# Patient Record
Sex: Female | Born: 1945
Health system: Southern US, Community
[De-identification: ages and names within clinical notes are randomized; demographics above are authoritative.]

## PROBLEM LIST (undated history)

## (undated) DIAGNOSIS — Z8719 Personal history of other diseases of the digestive system: Secondary | ICD-10-CM

## (undated) DIAGNOSIS — E785 Hyperlipidemia, unspecified: Secondary | ICD-10-CM

## (undated) DIAGNOSIS — D649 Anemia, unspecified: Secondary | ICD-10-CM

## (undated) DIAGNOSIS — H35033 Hypertensive retinopathy, bilateral: Secondary | ICD-10-CM

## (undated) DIAGNOSIS — H401130 Primary open-angle glaucoma, bilateral, stage unspecified: Secondary | ICD-10-CM

## (undated) DIAGNOSIS — Z860101 Personal history of adenomatous and serrated colon polyps: Secondary | ICD-10-CM

## (undated) DIAGNOSIS — H35359 Cystoid macular degeneration, unspecified eye: Secondary | ICD-10-CM

## (undated) DIAGNOSIS — H35059 Retinal neovascularization, unspecified, unspecified eye: Secondary | ICD-10-CM

## (undated) DIAGNOSIS — I890 Lymphedema, not elsewhere classified: Secondary | ICD-10-CM

## (undated) DIAGNOSIS — E119 Type 2 diabetes mellitus without complications: Secondary | ICD-10-CM

## (undated) DIAGNOSIS — Z8669 Personal history of other diseases of the nervous system and sense organs: Secondary | ICD-10-CM

## (undated) DIAGNOSIS — I774 Celiac artery compression syndrome: Secondary | ICD-10-CM

## (undated) DIAGNOSIS — K5730 Diverticulosis of large intestine without perforation or abscess without bleeding: Secondary | ICD-10-CM

## (undated) DIAGNOSIS — M1990 Unspecified osteoarthritis, unspecified site: Secondary | ICD-10-CM

## (undated) DIAGNOSIS — I10 Essential (primary) hypertension: Secondary | ICD-10-CM

## (undated) DIAGNOSIS — I714 Abdominal aortic aneurysm, without rupture, unspecified: Secondary | ICD-10-CM

## (undated) HISTORY — PX: JOINT REPLACEMENT: SHX530

## (undated) HISTORY — PX: COLONOSCOPY: SHX174

## (undated) HISTORY — PX: EYE SURGERY: SHX253

## (undated) HISTORY — PX: OTHER SURGICAL HISTORY: SHX169

## (undated) HISTORY — PX: REFRACTIVE SURGERY: SHX103

## (undated) HISTORY — PX: ABDOMINAL HYSTERECTOMY: SHX81

---

## 2004-10-20 ENCOUNTER — Ambulatory Visit: Payer: Self-pay | Admitting: Unknown Physician Specialty

## 2006-01-25 ENCOUNTER — Ambulatory Visit: Payer: Self-pay

## 2006-01-27 ENCOUNTER — Ambulatory Visit: Payer: Self-pay

## 2007-01-17 ENCOUNTER — Ambulatory Visit: Payer: Self-pay

## 2007-01-18 ENCOUNTER — Ambulatory Visit: Payer: Self-pay

## 2010-10-27 ENCOUNTER — Ambulatory Visit: Payer: Self-pay | Admitting: Unknown Physician Specialty

## 2010-11-17 ENCOUNTER — Ambulatory Visit: Payer: Self-pay | Admitting: Unknown Physician Specialty

## 2010-11-18 LAB — PATHOLOGY REPORT

## 2011-11-14 ENCOUNTER — Ambulatory Visit: Payer: Self-pay | Admitting: Unknown Physician Specialty

## 2012-02-23 ENCOUNTER — Emergency Department: Payer: Self-pay | Admitting: Unknown Physician Specialty

## 2012-02-28 ENCOUNTER — Ambulatory Visit: Payer: Self-pay | Admitting: Specialist

## 2012-02-28 DIAGNOSIS — I10 Essential (primary) hypertension: Secondary | ICD-10-CM

## 2012-03-01 ENCOUNTER — Ambulatory Visit: Payer: Self-pay | Admitting: Specialist

## 2012-12-13 ENCOUNTER — Ambulatory Visit: Payer: Self-pay | Admitting: Physician Assistant

## 2013-06-26 ENCOUNTER — Ambulatory Visit: Payer: Self-pay | Admitting: Orthopedic Surgery

## 2013-08-07 ENCOUNTER — Ambulatory Visit: Payer: Self-pay | Admitting: Orthopedic Surgery

## 2013-08-07 LAB — URINALYSIS, COMPLETE
Bacteria: NONE SEEN
Bilirubin,UR: NEGATIVE
Glucose,UR: NEGATIVE mg/dL (ref 0–75)
Nitrite: NEGATIVE
Ph: 8 (ref 4.5–8.0)
RBC,UR: 1 /HPF (ref 0–5)
Specific Gravity: 1.008 (ref 1.003–1.030)

## 2013-08-07 LAB — CBC
HCT: 43.8 % (ref 35.0–47.0)
MCH: 25.5 pg — ABNORMAL LOW (ref 26.0–34.0)
MCV: 78 fL — ABNORMAL LOW (ref 80–100)
Platelet: 215 10*3/uL (ref 150–440)
RBC: 5.6 10*6/uL — ABNORMAL HIGH (ref 3.80–5.20)
RDW: 14.4 % (ref 11.5–14.5)
WBC: 7.9 10*3/uL (ref 3.6–11.0)

## 2013-08-07 LAB — BASIC METABOLIC PANEL
Anion Gap: 4 — ABNORMAL LOW (ref 7–16)
BUN: 15 mg/dL (ref 7–18)
Calcium, Total: 9.1 mg/dL (ref 8.5–10.1)
Co2: 33 mmol/L — ABNORMAL HIGH (ref 21–32)
Creatinine: 0.94 mg/dL (ref 0.60–1.30)
Osmolality: 286 (ref 275–301)
Sodium: 142 mmol/L (ref 136–145)

## 2013-08-07 LAB — SEDIMENTATION RATE: Erythrocyte Sed Rate: 6 mm/hr (ref 0–30)

## 2013-08-07 LAB — APTT: Activated PTT: 28.6 secs (ref 23.6–35.9)

## 2013-08-07 LAB — PROTIME-INR: INR: 1

## 2013-08-21 ENCOUNTER — Inpatient Hospital Stay: Payer: Self-pay | Admitting: Orthopedic Surgery

## 2013-08-22 LAB — BASIC METABOLIC PANEL
Anion Gap: 7 (ref 7–16)
BUN: 13 mg/dL (ref 7–18)
Calcium, Total: 8.3 mg/dL — ABNORMAL LOW (ref 8.5–10.1)
Chloride: 104 mmol/L (ref 98–107)
Co2: 28 mmol/L (ref 21–32)
EGFR (Non-African Amer.): 46 — ABNORMAL LOW
Glucose: 136 mg/dL — ABNORMAL HIGH (ref 65–99)
Osmolality: 280 (ref 275–301)
Potassium: 3 mmol/L — ABNORMAL LOW (ref 3.5–5.1)
Sodium: 139 mmol/L (ref 136–145)

## 2013-08-23 LAB — BASIC METABOLIC PANEL
Calcium, Total: 8.2 mg/dL — ABNORMAL LOW (ref 8.5–10.1)
Chloride: 103 mmol/L (ref 98–107)
Co2: 30 mmol/L (ref 21–32)
Creatinine: 1.17 mg/dL (ref 0.60–1.30)
EGFR (African American): 55 — ABNORMAL LOW
EGFR (Non-African Amer.): 48 — ABNORMAL LOW
Glucose: 168 mg/dL — ABNORMAL HIGH (ref 65–99)
Osmolality: 277 (ref 275–301)
Potassium: 3.4 mmol/L — ABNORMAL LOW (ref 3.5–5.1)
Sodium: 137 mmol/L (ref 136–145)

## 2013-08-23 LAB — PATHOLOGY REPORT

## 2013-08-26 ENCOUNTER — Ambulatory Visit: Payer: Self-pay | Admitting: Internal Medicine

## 2013-08-27 ENCOUNTER — Encounter: Payer: Self-pay | Admitting: Internal Medicine

## 2013-09-12 ENCOUNTER — Encounter: Payer: Self-pay | Admitting: Internal Medicine

## 2014-06-27 ENCOUNTER — Ambulatory Visit: Payer: Self-pay | Admitting: Physician Assistant

## 2015-01-02 NOTE — Discharge Summary (Signed)
[name], Rose Perry MR#:  [id number] DATE OF BIRTH:  Oct 06, 1945  DATE OF ADMISSION:  08/21/2013 DATE OF DISCHARGE:  08/24/2013  ADMITTING DIAGNOSIS: Left knee osteoarthritis.   DISCHARGE DIAGNOSIS: Left knee osteoarthritis.   PROCEDURE: Left total knee replacement.   ANESTHESIA: Spinal.   SURGEON: Laurene Footman, M.D.   ASSISTANT: Rachelle Hora, PA-C.   SPECIMEN: Cut ends of bone.   ESTIMATED BLOOD LOSS: 50 mL.   COMPLICATIONS: None.   TOURNIQUET TIME: 64 minutes at 300 mmHg.  IMPLANTS: Medacta GMK primary sphere system, size 3 left, 10 mm insert, 3 left femur, 3 left tibia, with an 11 x 30 mm stem and a size 2 patella.   CONDITION: To recovery room stable.  HISTORY: The patient a 69 year old African American female who has failed conservative measures and treatment for left knee osteoarthritis including Tylenol, Aleve, using a cane, and steroid injections. She has wished to proceed with total knee replacement. Pain in the clinic was 8/10. She has had no history of problems with anesthesia, bleeding, or blood clotting issues. She also plans to be discharged to a rehab facility as her sister works at a rehab facility and strongly recommends this help with her perioperative rehab.   PHYSICAL EXAMINATION: GENERAL: Well developed, well nourished, alert and oriented x3, no acute distress.  NEUROLOGIC: Sensation intact to the left lower extremity.  HEENT: Normocephalic, atraumatic. Pupils equal, round, and reactive light and accommodation. She has full upper dentures. No lower dentures or partials.  CARDIOVASCULAR: No edema or effusion. Rate is regular. There are no murmurs.  RESPIRATORY: Lung sounds clear to auscultation. No wheezing, rales, rhonchi, or crackles. Chest expansion is symmetric bilaterally.  ABDOMEN: No hepatomegaly. No splenomegaly. Soft and nontender to palpation throughout entire abdomen. Positive bowel sounds in all 4 quadrants. EXTREMITIES: Left knee range  of motion is 3 to 95 degrees and painful with flexion. She is tender to palpation in the medial and lateral joint line. Ligamentous exam is stable. No McMurray's.  SKIN: No scars, rashes, or lesions.   HOSPITAL COURSE: The patient was admitted to the hospital on 08/21/2013. She had surgery that same day and was brought to the orthopedic floor from the PAC-U in stable condition. On postop day 1, the patient had a potassium of 3 and acute postop blood loss anemia with a hemoglobin of 11.9. Platelets were stable. She was doing well. She had good progress with physical therapy. She was given potassium 20 mEq b.i.d. On postop day 2, the patient's pain was controlled. Potassium was up to 3.4. She continued with 2 more doses of potassium 20 mEq b.i.d. The patient's vital signs were stable. She has continued to progress well with physical therapy. On postop day 3, the patient was doing well, vital signs stable, she had a bowel movement, and she was stable and ready for discharge to rehab facility with physical therapy and occupational therapy.   CONDITION AT DISCHARGE: Stable.   DISCHARGE INSTRUCTIONS: The patient can increase weight-bearing on the effected extremity. She needs to elevated the effected foot or leg on 1 or 2 pillows with the foot higher than the knee. Thigh-high TED hose on both legs and remove 1 hour per 8 hour shift. Elevate the heels off the bed. Use incentive spirometry every hour while awake and encourage cough and deep breathing. She may resume a regular diet as tolerated. No concentrated sweets or sugar. Continue using Polar Care unit maintaining a temperature between 40 and 50 degrees.  Do not get the dressing or bandage wet or dirty. Call [facility] orthopedics if the dressing gets water under it. Leave the dressing on. Call [facility] orthopedics if any of the following occur: Bright red bleeding from the incision and wound, fever above 100.5 degrees, redness or swelling or drainage  at the incision. Call [facility] orthopedics if you experience any increased leg pain, numbness, or weakness in your legs or bowel or bladder symptoms. Physical therapy has been arranged for your postop care at the rehab facility. Call [facility] orthopedics in 2 weeks for staple removal.   DISCHARGE MEDICATIONS: 1.  Atorvastatin 20 mg 1 tablet orally once a day in the morning.  2.  Amlodipine 5 mg 1 tablet orally once a day in the morning. 3.  Aspirin low-dose 81 mg oral delayed release tablet 1 tablet orally once a day. 4.  HCTZ 25 mg/37.5 mg oral tablet 1 tablet orally once a day in the morning. 5.  Tylenol 500 mg oral tablet 1 tablet orally every 4 hours as needed for pain or temp greater than 100.4.  6.  Oxycodone 5 mg oral tablet 1 tab orally every 4 hours needed for pain.  7.  Magnesium hydroxide oral suspension 30 mL orally 2 times a day as needed for constipation, 8.  Mylanta 30 mL orally every 6 hours as needed for indigestion or heartburn. 9.  Xarelto 10 mg oral tablet 1 tablet orally once a day in the morning x9 days. 10.  Metoclopramide 10 mg oral tablet 1 tablet orally. 11.  Ranitidine 150 mg oral tablet 1 tablet orally once. 12.  Bisacodyl 10 mg rectal suppository 1 suppository rectally once a day as needed for constipation.  13.  Lidocaine 0.2 mL subcutaneous once. ____________________________ T. Rachelle Hora, PA-C tcg:sb D: 08/23/2013 13:01:00 ET T: 08/23/2013 13:21:29 ET JOB#: [id number]  cc: T. Rachelle Hora, PA-C, <Dictator> Duanne Guess [place] ELECTRONICALLY SIGNED 09/03/2013 8:00

## 2015-01-02 NOTE — Op Note (Signed)
[name], Rose Perry MR#:  [id number] DATE OF BIRTH:  [birth year]  DATE OF PROCEDURE:  08/21/2013  PREOPERATIVE DIAGNOSIS: Left knee osteoarthritis.   POSTOPERATIVE DIAGNOSIS: Left knee osteoarthritis.   PROCEDURE: Left total knee replacement.   ANESTHESIA: Spinal.   SURGEON: Hessie Knows, M.D.   ASSISTANT:  Rachelle Hora, PA-C.   DESCRIPTION OF PROCEDURE: The patient was brought to the operating room and after adequate spinal anesthesia was obtained, the left leg was prepped and draped in the usual sterile fashion, after application of a tourniquet to the upper thigh and the Alvarado legholder utilized. After appropriate patient identification and timeout procedures were completed, the leg was exsanguinated with an Esmarch and the tourniquet raised to 300 mmHg. A midline skin incision was made, followed by a medial parapatellar arthrotomy. Inspection of the knee revealed extensive synovitis, tricompartmental osteoarthritis, particularly of the distal medial and femoral condyles. The ACL was excised along with the fat pad, and the proximal tibia was exposed to allow for application of the proximal tibia Medacta cutting block. The proximal tibia cut was then carried out, and the menisci were removed at this point, along with the proximal tibial bone. The distal femoral cutting block was applied with drill holes made to determine the rotation for the femoral component, as well as the distal femoral cuts of the bone matched the preop template. The 4-in-1 cutting block was applied, size 3 on the femur. Anterior, posterior and chamfer cuts made, and trial components were then placed. The size 3 tibial component appeared to give good coverage and was pinned in place; rotation based on one of the initial pins from the tibial block. The 3 femur fit well and with a 10 mm insert, full extension was obtained with excellent stability. These trials were removed with a proximal drill hole for a short stem on  the tibial component. The notch cut was made in the femoral trochlea, and the patella cut using the patellar cutting guide with drill holes. This measured a size 2. At this point, the tourniquet was let down and hemostasis checked with electrocautery. The posterior aspect of the knee was then infiltrated with a combination of Sensorcaine, morphine and Toradol, as well as periarticular tissue of Exparel diluted with saline. After raising the tourniquet and thoroughly irrigating and drying the bone surfaces, the components were cemented into place. The tibial component was cemented first, followed by the polyethylene with set screw. The femoral component cemented into place, and then the knee held in extension with the patellar button then clamped into place. All components cemented. After the cement had set, excess cement was removed, and the knee was again thoroughly irrigated and the tourniquet left down. The arthrotomy was repaired using, after slight lateral release, with a #1 Ethibond along the medial patella retinaculum and then heavy quill for the arthrotomy. 2-0 quill was used subcutaneously followed by skin staples. Xeroform, 4 x 4's, ABD, Webril, Polar Care and Ace wrap applied, along with a knee immobilizer.   SPECIMENS: Cut ends of bone.   ESTIMATED BLOOD LOSS: 50 mL.   COMPLICATIONS: None.   TOURNIQUET TIME: 64 minutes at 300 mmHg.   IMPLANTS: Medacta GMK Primary Sphere System, size 3 left, 10 mm insert, 3 left femur, 3 left tibia, with an 11 x 30 mm stem and a size 2 patella.   CONDITION: To recovery room in stable.   PATIENT NAME: Stem and a Marzella Schlein    ____________________________ Laurene Footman, MD mjm:dmm  D: 08/21/2013 12:25:30 ET T: 08/21/2013 12:51:44 ET JOB#: [id number]  cc: Laurene Footman, MD, <Dictator> Laurene Footman MD ELECTRONICALLY SIGNED 08/21/2013 17:38

## 2015-01-04 NOTE — Op Note (Signed)
[name], Rose Perry MR#:  [id number] DATE OF BIRTH:  1945-11-01  DATE OF PROCEDURE:  03/01/2012  PREOPERATIVE DIAGNOSIS: Foreign body, left foot.   POSTOPERATIVE DIAGNOSIS: Foreign body, left foot.   PROCEDURE: Removal of foreign body (sewing needle) left foot.   SURGEON: Lucas Mallow, MD   ANESTHESIA: General.   COMPLICATIONS: None.   TOURNIQUET TIME: Approximately 20 minutes.   PROCEDURE: After adequate induction of general anesthesia, the left lower extremity was thoroughly prepped with alcohol and ChloraPrep and draped in standard sterile fashion. Tourniquet was used about the calf for approximately 20 minutes. The leg was elevated and the tourniquet elevated to 300 mmHg. C-arm was used to localize the sewing needle on the plantar surface of the foot. Longitudinal one-inch incision was made and the dissection carried down through subcutaneous tissue using the loupe magnification. Approximately 1/4-inch underneath the skin the tip of the needle was seen and was removed. There was a small amount of purulent material present. This was thoroughly irrigated multiple times. Skin edges were infiltrated with 0.5% Marcaine with epinephrine. Skin is closed with 4-0 nylon. Soft bulky dressing is applied. Tourniquet is released. The patient is returned to the recovery room in satisfactory condition having tolerated the procedure quite well.   ____________________________ Lucas Mallow, MD ces:drc D: 03/01/2012 11:38:36 ET T: 03/01/2012 11:52:11 ET JOB#: [id number]  cc: Lucas Mallow, MD, <Dictator> Lucas Mallow MD ELECTRONICALLY SIGNED 03/08/2012 14:11

## 2015-02-11 DIAGNOSIS — E785 Hyperlipidemia, unspecified: Secondary | ICD-10-CM | POA: Diagnosis not present

## 2015-02-11 DIAGNOSIS — R739 Hyperglycemia, unspecified: Secondary | ICD-10-CM | POA: Diagnosis not present

## 2015-02-11 DIAGNOSIS — M1990 Unspecified osteoarthritis, unspecified site: Secondary | ICD-10-CM | POA: Diagnosis not present

## 2015-02-11 DIAGNOSIS — E876 Hypokalemia: Secondary | ICD-10-CM | POA: Diagnosis not present

## 2015-02-11 DIAGNOSIS — I10 Essential (primary) hypertension: Secondary | ICD-10-CM | POA: Diagnosis not present

## 2015-02-18 DIAGNOSIS — I10 Essential (primary) hypertension: Secondary | ICD-10-CM | POA: Diagnosis not present

## 2015-02-18 DIAGNOSIS — E119 Type 2 diabetes mellitus without complications: Secondary | ICD-10-CM | POA: Diagnosis not present

## 2015-02-18 DIAGNOSIS — M1990 Unspecified osteoarthritis, unspecified site: Secondary | ICD-10-CM | POA: Diagnosis not present

## 2015-02-18 DIAGNOSIS — E113292 Type 2 diabetes mellitus with mild nonproliferative diabetic retinopathy without macular edema, left eye: Secondary | ICD-10-CM

## 2015-02-18 DIAGNOSIS — E782 Mixed hyperlipidemia: Secondary | ICD-10-CM | POA: Diagnosis not present

## 2015-04-15 DIAGNOSIS — M3501 Sicca syndrome with keratoconjunctivitis: Secondary | ICD-10-CM | POA: Diagnosis not present

## 2015-06-30 DIAGNOSIS — L02439 Carbuncle of limb, unspecified: Secondary | ICD-10-CM | POA: Diagnosis not present

## 2016-03-24 DIAGNOSIS — M1990 Unspecified osteoarthritis, unspecified site: Secondary | ICD-10-CM | POA: Diagnosis not present

## 2016-03-24 DIAGNOSIS — Z1239 Encounter for other screening for malignant neoplasm of breast: Secondary | ICD-10-CM | POA: Diagnosis not present

## 2016-03-24 DIAGNOSIS — Z0000 Encounter for general adult medical examination without abnormal findings: Secondary | ICD-10-CM | POA: Diagnosis not present

## 2016-03-24 DIAGNOSIS — I10 Essential (primary) hypertension: Secondary | ICD-10-CM | POA: Diagnosis not present

## 2016-03-24 DIAGNOSIS — E119 Type 2 diabetes mellitus without complications: Secondary | ICD-10-CM | POA: Diagnosis not present

## 2016-03-24 DIAGNOSIS — E782 Mixed hyperlipidemia: Secondary | ICD-10-CM | POA: Diagnosis not present

## 2016-03-31 DIAGNOSIS — I10 Essential (primary) hypertension: Secondary | ICD-10-CM | POA: Diagnosis not present

## 2016-03-31 DIAGNOSIS — Z1239 Encounter for other screening for malignant neoplasm of breast: Secondary | ICD-10-CM | POA: Diagnosis not present

## 2016-03-31 DIAGNOSIS — H539 Unspecified visual disturbance: Secondary | ICD-10-CM | POA: Diagnosis not present

## 2016-03-31 DIAGNOSIS — E119 Type 2 diabetes mellitus without complications: Secondary | ICD-10-CM | POA: Diagnosis not present

## 2016-03-31 DIAGNOSIS — E782 Mixed hyperlipidemia: Secondary | ICD-10-CM | POA: Diagnosis not present

## 2016-03-31 DIAGNOSIS — Z0000 Encounter for general adult medical examination without abnormal findings: Secondary | ICD-10-CM | POA: Diagnosis not present

## 2016-03-31 DIAGNOSIS — M79671 Pain in right foot: Secondary | ICD-10-CM | POA: Diagnosis not present

## 2016-03-31 DIAGNOSIS — Z1211 Encounter for screening for malignant neoplasm of colon: Secondary | ICD-10-CM | POA: Diagnosis not present

## 2016-04-05 ENCOUNTER — Other Ambulatory Visit: Payer: Self-pay | Admitting: Physician Assistant

## 2016-04-05 DIAGNOSIS — Z1231 Encounter for screening mammogram for malignant neoplasm of breast: Secondary | ICD-10-CM

## 2016-04-19 ENCOUNTER — Other Ambulatory Visit: Payer: Self-pay | Admitting: Physician Assistant

## 2016-04-19 ENCOUNTER — Ambulatory Visit
Admission: RE | Admit: 2016-04-19 | Discharge: 2016-04-19 | Disposition: A | Discharge status: Home / Self Care | Payer: Commercial Managed Care - HMO | Source: Ambulatory Visit | Attending: Physician Assistant | Admitting: Physician Assistant

## 2016-04-19 DIAGNOSIS — Z1231 Encounter for screening mammogram for malignant neoplasm of breast: Secondary | ICD-10-CM | POA: Insufficient documentation

## 2016-04-25 DIAGNOSIS — M79671 Pain in right foot: Secondary | ICD-10-CM | POA: Diagnosis not present

## 2016-04-25 DIAGNOSIS — M7731 Calcaneal spur, right foot: Secondary | ICD-10-CM | POA: Diagnosis not present

## 2016-04-25 DIAGNOSIS — M722 Plantar fascial fibromatosis: Secondary | ICD-10-CM | POA: Diagnosis not present

## 2016-05-30 DIAGNOSIS — M722 Plantar fascial fibromatosis: Secondary | ICD-10-CM | POA: Diagnosis not present

## 2016-06-03 DIAGNOSIS — Z86010 Personal history of colonic polyps: Secondary | ICD-10-CM | POA: Diagnosis not present

## 2016-06-17 DIAGNOSIS — H04223 Epiphora due to insufficient drainage, bilateral lacrimal glands: Secondary | ICD-10-CM | POA: Diagnosis not present

## 2016-07-01 DIAGNOSIS — H04223 Epiphora due to insufficient drainage, bilateral lacrimal glands: Secondary | ICD-10-CM | POA: Diagnosis not present

## 2016-08-19 ENCOUNTER — Encounter: Payer: Self-pay | Admitting: *Deleted

## 2016-08-22 ENCOUNTER — Ambulatory Visit: Payer: Commercial Managed Care - HMO | Admitting: Anesthesiology

## 2016-08-22 ENCOUNTER — Encounter: Payer: Self-pay | Admitting: Anesthesiology

## 2016-08-22 ENCOUNTER — Ambulatory Visit
Admission: RE | Admit: 2016-08-22 | Discharge: 2016-08-22 | Disposition: A | Discharge status: Home / Self Care | Payer: Commercial Managed Care - HMO | Source: Ambulatory Visit | Attending: Unknown Physician Specialty | Admitting: Unknown Physician Specialty

## 2016-08-22 ENCOUNTER — Encounter
Admission: RE | Disposition: A | Discharge status: Home / Self Care | Payer: Self-pay | Source: Ambulatory Visit | Attending: Unknown Physician Specialty

## 2016-08-22 DIAGNOSIS — Z79899 Other long term (current) drug therapy: Secondary | ICD-10-CM | POA: Insufficient documentation

## 2016-08-22 DIAGNOSIS — Z86010 Personal history of colonic polyps: Secondary | ICD-10-CM | POA: Insufficient documentation

## 2016-08-22 DIAGNOSIS — K5790 Diverticulosis of intestine, part unspecified, without perforation or abscess without bleeding: Secondary | ICD-10-CM | POA: Diagnosis not present

## 2016-08-22 DIAGNOSIS — K5730 Diverticulosis of large intestine without perforation or abscess without bleeding: Secondary | ICD-10-CM | POA: Diagnosis not present

## 2016-08-22 DIAGNOSIS — Z1211 Encounter for screening for malignant neoplasm of colon: Secondary | ICD-10-CM | POA: Insufficient documentation

## 2016-08-22 DIAGNOSIS — K640 First degree hemorrhoids: Secondary | ICD-10-CM | POA: Insufficient documentation

## 2016-08-22 DIAGNOSIS — Z87891 Personal history of nicotine dependence: Secondary | ICD-10-CM | POA: Insufficient documentation

## 2016-08-22 DIAGNOSIS — E119 Type 2 diabetes mellitus without complications: Secondary | ICD-10-CM | POA: Diagnosis not present

## 2016-08-22 DIAGNOSIS — Z7982 Long term (current) use of aspirin: Secondary | ICD-10-CM | POA: Diagnosis not present

## 2016-08-22 DIAGNOSIS — I10 Essential (primary) hypertension: Secondary | ICD-10-CM | POA: Diagnosis not present

## 2016-08-22 DIAGNOSIS — K648 Other hemorrhoids: Secondary | ICD-10-CM | POA: Diagnosis not present

## 2016-08-22 DIAGNOSIS — E785 Hyperlipidemia, unspecified: Secondary | ICD-10-CM | POA: Insufficient documentation

## 2016-08-22 DIAGNOSIS — Z792 Long term (current) use of antibiotics: Secondary | ICD-10-CM | POA: Insufficient documentation

## 2016-08-22 HISTORY — DX: Type 2 diabetes mellitus without complications: E11.9

## 2016-08-22 HISTORY — DX: Essential (primary) hypertension: I10

## 2016-08-22 HISTORY — DX: Unspecified osteoarthritis, unspecified site: M19.90

## 2016-08-22 HISTORY — PX: COLONOSCOPY WITH PROPOFOL: SHX5780

## 2016-08-22 HISTORY — DX: Hyperlipidemia, unspecified: E78.5

## 2016-08-22 LAB — GLUCOSE, CAPILLARY: Glucose-Capillary: 96 mg/dL (ref 65–99)

## 2016-08-22 SURGERY — COLONOSCOPY WITH PROPOFOL
Anesthesia: General

## 2016-08-22 MED ORDER — PROPOFOL 10 MG/ML IV BOLUS
INTRAVENOUS | Status: DC | PRN
  Administered 2016-08-22: 50 mg via INTRAVENOUS

## 2016-08-22 MED ORDER — PIPERACILLIN-TAZOBACTAM 3.375 G IVPB 30 MIN
3.3750 g | Freq: Once | INTRAVENOUS | Status: AC
  Administered 2016-08-22: 3.375 g via INTRAVENOUS
  Filled 2016-08-22: qty 50

## 2016-08-22 MED ORDER — PROPOFOL 500 MG/50ML IV EMUL
INTRAVENOUS | Status: DC | PRN
Start: 2016-08-22 — End: 2016-08-22
  Administered 2016-08-22: 125 ug/kg/min via INTRAVENOUS

## 2016-08-22 MED ORDER — SODIUM CHLORIDE 0.9 % IV SOLN
INTRAVENOUS | Status: DC
  Administered 2016-08-22: 1000 mL via INTRAVENOUS

## 2016-08-22 MED ORDER — SODIUM CHLORIDE 0.9 % IV SOLN: INTRAVENOUS | Status: DC

## 2016-08-22 NOTE — H&P (Signed)
   Primary Care Physician:  [facility] Primary Gastroenterologist:  Dr. Vira Agar  Pre-Procedure History & Physical: HPI:  Rose Perry is a 70 y.o. female is here for an colonoscopy.   Past Medical History:  Diagnosis Date  . Arthritis   . Diabetes mellitus without complication ([place])   . Elevated lipids   . Hypertension     Past Surgical History:  Procedure Laterality Date  . ABDOMINAL HYSTERECTOMY    . COLONOSCOPY    . ltkr    . needle in foot removed    . REFRACTIVE SURGERY      Prior to Admission medications   Medication Sig Start Date End Date Taking? Authorizing Provider  amLODipine (NORVASC) 5 MG tablet Take 5 mg by mouth daily.   Yes Historical Provider, MD  amoxicillin (AMOXIL) 500 MG capsule Take 500 mg by mouth 3 (three) times daily.   Yes Historical Provider, MD  aspirin EC 81 MG tablet Take 81 mg by mouth daily.   Yes Historical Provider, MD  atorvastatin (LIPITOR) 20 MG tablet Take 20 mg by mouth daily.   Yes Historical Provider, MD  triamcinolone cream (KENALOG) 0.1 % Apply 1 application topically 2 (two) times daily.   Yes Historical Provider, MD  triamterene-hydrochlorothiazide (MAXZIDE-25) 37.5-25 MG tablet Take 1 tablet by mouth daily.   Yes Historical Provider, MD    Allergies as of 08/03/2016  . (Not on File)    History reviewed. No pertinent family history.  Social History   Social History  . Marital status: Divorced    Spouse name: N/A  . Number of children: N/A  . Years of education: N/A   Occupational History  . Not on file.   Social History Main Topics  . Smoking status: Former Research scientist (life sciences)  . Smokeless tobacco: Never Used  . Alcohol use No  . Drug use: No  . Sexual activity: Not on file   Other Topics Concern  . Not on file   Social History Narrative  . No narrative on file    Review of Systems: See HPI, otherwise negative ROS  Physical Exam: BP (!) 144/72   Pulse 67   Temp 97 F (36.1 C) (Tympanic)   Resp 16    Ht 5\' 8"  (1.727 m)   Wt 105.2 kg (232 lb)   SpO2 98%   BMI 35.28 kg/m  General:   Alert,  pleasant and cooperative in NAD Head:  Normocephalic and atraumatic. Neck:  Supple; no masses or thyromegaly. Lungs:  Clear throughout to auscultation.    Heart:  Regular rate and rhythm. Abdomen:  Soft, nontender and nondistended. Normal bowel sounds, without guarding, and without rebound.   Neurologic:  Alert and  oriented x4;  grossly normal neurologically.  Impression/Plan: Rose Perry is here for an colonoscopy to be performed for [facility] colon polyps  Risks, benefits, limitations, and alternatives regarding  colonoscopy have been reviewed with the patient.  Questions have been answered.  All parties agreeable.   Gaylyn Cheers, MD  08/22/2016, 7:47 AM

## 2016-08-22 NOTE — Anesthesia Preprocedure Evaluation (Signed)
Anesthesia Evaluation  Patient identified by MRN, date of birth, ID band Patient awake    Reviewed: Allergy & Precautions, H&P , NPO status , Patient's Chart, lab work & pertinent test results  History of Anesthesia Complications Negative for: history of anesthetic complications  Airway Mallampati: III  TM Distance: >3 FB Neck ROM: limited    Dental  (+) Poor Dentition, Chipped, Missing, Upper Dentures, Partial Lower   Pulmonary neg shortness of breath, former smoker,    Pulmonary exam normal breath sounds clear to auscultation       Cardiovascular Exercise Tolerance: Good hypertension, (-) angina(-) Past MI and (-) DOE Normal cardiovascular exam Rhythm:regular Rate:Normal     Neuro/Psych negative neurological ROS  negative psych ROS   GI/Hepatic negative GI ROS, Neg liver ROS, neg GERD  ,  Endo/Other  diabetes, Type 2  Renal/GU negative Renal ROS  negative genitourinary   Musculoskeletal  (+) Arthritis ,   Abdominal   Peds  Hematology negative hematology ROS (+)   Anesthesia Other Findings Signs and symptoms suggestive of sleep apnea   Past Medical History: No date: Arthritis No date: Diabetes mellitus without complication ([facility]) No date: Elevated lipids No date: Hypertension  Past Surgical History: No date: ABDOMINAL HYSTERECTOMY No date: COLONOSCOPY No date: ltkr No date: needle in foot removed No date: REFRACTIVE SURGERY  BMI    Body Mass Index:  35.28 kg/m      Reproductive/Obstetrics negative OB ROS                             Anesthesia Physical Anesthesia Plan  ASA: III  Anesthesia Plan: General   Post-op Pain Management:    Induction:   Airway Management Planned:   Additional Equipment:   Intra-op Plan:   Post-operative Plan:   Informed Consent: I have reviewed the patients History and Physical, chart, labs and discussed the procedure including the  risks, benefits and alternatives for the proposed anesthesia with the patient or authorized representative who has indicated his/her understanding and acceptance.   Dental Advisory Given  Plan Discussed with: Anesthesiologist, CRNA and Surgeon  Anesthesia Plan Comments:         Anesthesia Quick Evaluation

## 2016-08-22 NOTE — Op Note (Addendum)
[facility] Patient Name: Rose Perry Procedure Date: 08/22/2016 7:46 AM MRN: [id number] Account #: [id number] Date of Birth: July 22, 1946 Admit Type: Outpatient Age: 70 Room: [facility] ENDO ROOM 4 Gender: Female Note Status: Finalized Procedure:            Colonoscopy Indications:          High risk colon cancer surveillance: Personal history                        of colonic polyps Providers:            Manya Silvas, MD Referring MD:         Precious Bard, MD (Referring MD) Medicines:            Propofol per Anesthesia Complications:        No immediate complications. Procedure:            Pre-Anesthesia Assessment:                       - After reviewing the risks and benefits, the patient                        was deemed in satisfactory condition to undergo the                        procedure.                       After obtaining informed consent, the colonoscope was                        passed under direct vision. Throughout the procedure,                        the patient's blood pressure, pulse, and oxygen                        saturations were monitored continuously. The                        Colonoscope was introduced through the anus and                        advanced to the the cecum, identified by appendiceal                        orifice and ileocecal valve. The colonoscopy was                        performed without difficulty. The patient tolerated the                        procedure well. The quality of the bowel preparation                        was adequate to identify polyps. Findings:      Multiple small and large-mouthed diverticula were found in the sigmoid       colon, descending colon, transverse colon and ascending colon.      Internal hemorrhoids were found during endoscopy. The hemorrhoids were       small  and Grade I (internal hemorrhoids that do not prolapse).      The exam was otherwise without  abnormality. Impression:           - Diverticulosis in the sigmoid colon, in the                        descending colon, in the transverse colon and in the                        ascending colon.                       - Internal hemorrhoids.                       - The examination was otherwise normal.                       - No specimens collected. Recommendation:       - Repeat colonoscopy in 5 years for surveillance. Manya Silvas, MD 08/22/2016 8:13:09 AM This report has been signed electronically. Number of Addenda: 0 Note Initiated On: 08/22/2016 7:46 AM Scope Withdrawal Time: 0 hours 11 minutes 40 seconds  Total Procedure Duration: 0 hours 17 minutes 19 seconds       [facility]

## 2016-08-22 NOTE — Anesthesia Postprocedure Evaluation (Signed)
Anesthesia Post Note  Patient: Rose Perry  Procedure(s) Performed: Procedure(s) (LRB): COLONOSCOPY WITH PROPOFOL (N/A)  Patient location during evaluation: Endoscopy Anesthesia Type: General Level of consciousness: awake and alert Pain management: pain level controlled Vital Signs Assessment: post-procedure vital signs reviewed and stable Respiratory status: spontaneous breathing, nonlabored ventilation, respiratory function stable and patient connected to nasal cannula oxygen Cardiovascular status: blood pressure returned to baseline and stable Postop Assessment: no signs of nausea or vomiting Anesthetic complications: no    Last Vitals:  Vitals:   08/22/16 0834 08/22/16 0844  BP: (!) 122/58 138/66  Pulse: (!) 58 (!) 56  Resp: 19 15  Temp:      Last Pain:  Vitals:   08/22/16 0815  TempSrc: Temporal                 Precious Haws Axel Frisk

## 2016-08-22 NOTE — Transfer of Care (Signed)
Immediate Anesthesia Transfer of Care Note  Patient: Rose Perry  Procedure(s) Performed: Procedure(s): COLONOSCOPY WITH PROPOFOL (N/A)  Patient Location: PACU  Anesthesia Type:General  Level of Consciousness: awake, alert , oriented and patient cooperative  Airway & Oxygen Therapy: Patient Spontanous Breathing and Patient connected to nasal cannula oxygen  Post-op Assessment: Report given to RN and Post -op Vital signs reviewed and stable  Post vital signs: Reviewed and stable  Last Vitals:  Vitals:   08/22/16 0814 08/22/16 0815  BP: 97/70 97/70  Pulse: 74 78  Resp: (!) 22   Temp: 36.3 C     Last Pain:  Vitals:   08/22/16 0815  TempSrc: Temporal         Complications: No apparent anesthesia complications

## 2016-08-23 ENCOUNTER — Encounter: Payer: Self-pay | Admitting: Unknown Physician Specialty

## 2017-03-17 ENCOUNTER — Other Ambulatory Visit: Payer: Self-pay | Admitting: Physician Assistant

## 2017-03-17 DIAGNOSIS — Z1231 Encounter for screening mammogram for malignant neoplasm of breast: Secondary | ICD-10-CM

## 2017-04-05 DIAGNOSIS — E782 Mixed hyperlipidemia: Secondary | ICD-10-CM | POA: Diagnosis not present

## 2017-04-05 DIAGNOSIS — Z0000 Encounter for general adult medical examination without abnormal findings: Secondary | ICD-10-CM | POA: Diagnosis not present

## 2017-04-05 DIAGNOSIS — I10 Essential (primary) hypertension: Secondary | ICD-10-CM | POA: Diagnosis not present

## 2017-04-05 DIAGNOSIS — E119 Type 2 diabetes mellitus without complications: Secondary | ICD-10-CM | POA: Diagnosis not present

## 2017-04-17 DIAGNOSIS — E876 Hypokalemia: Secondary | ICD-10-CM | POA: Diagnosis not present

## 2017-04-20 ENCOUNTER — Ambulatory Visit
Admission: RE | Admit: 2017-04-20 | Discharge: 2017-04-20 | Disposition: A | Discharge status: Home / Self Care | Payer: Commercial Managed Care - HMO | Source: Ambulatory Visit | Attending: Physician Assistant | Admitting: Physician Assistant

## 2017-04-20 DIAGNOSIS — Z1231 Encounter for screening mammogram for malignant neoplasm of breast: Secondary | ICD-10-CM | POA: Insufficient documentation

## 2017-07-26 DIAGNOSIS — E782 Mixed hyperlipidemia: Secondary | ICD-10-CM | POA: Diagnosis not present

## 2017-07-26 DIAGNOSIS — Z0000 Encounter for general adult medical examination without abnormal findings: Secondary | ICD-10-CM | POA: Diagnosis not present

## 2017-07-26 DIAGNOSIS — E119 Type 2 diabetes mellitus without complications: Secondary | ICD-10-CM | POA: Diagnosis not present

## 2017-07-26 DIAGNOSIS — I10 Essential (primary) hypertension: Secondary | ICD-10-CM | POA: Diagnosis not present

## 2018-04-18 DIAGNOSIS — E782 Mixed hyperlipidemia: Secondary | ICD-10-CM | POA: Diagnosis not present

## 2018-04-18 DIAGNOSIS — Z0000 Encounter for general adult medical examination without abnormal findings: Secondary | ICD-10-CM | POA: Diagnosis not present

## 2018-04-18 DIAGNOSIS — E119 Type 2 diabetes mellitus without complications: Secondary | ICD-10-CM | POA: Diagnosis not present

## 2018-04-18 DIAGNOSIS — I10 Essential (primary) hypertension: Secondary | ICD-10-CM | POA: Diagnosis not present

## 2018-04-25 DIAGNOSIS — Z6841 Body Mass Index (BMI) 40.0 and over, adult: Secondary | ICD-10-CM | POA: Diagnosis not present

## 2018-04-25 DIAGNOSIS — E782 Mixed hyperlipidemia: Secondary | ICD-10-CM | POA: Diagnosis not present

## 2018-04-25 DIAGNOSIS — E119 Type 2 diabetes mellitus without complications: Secondary | ICD-10-CM | POA: Diagnosis not present

## 2018-04-25 DIAGNOSIS — I10 Essential (primary) hypertension: Secondary | ICD-10-CM | POA: Diagnosis not present

## 2018-04-25 DIAGNOSIS — Z780 Asymptomatic menopausal state: Secondary | ICD-10-CM | POA: Diagnosis not present

## 2018-04-25 DIAGNOSIS — Z0000 Encounter for general adult medical examination without abnormal findings: Secondary | ICD-10-CM | POA: Diagnosis not present

## 2018-04-25 DIAGNOSIS — Z1239 Encounter for other screening for malignant neoplasm of breast: Secondary | ICD-10-CM | POA: Diagnosis not present

## 2018-05-03 DIAGNOSIS — Z780 Asymptomatic menopausal state: Secondary | ICD-10-CM | POA: Diagnosis not present

## 2018-05-04 ENCOUNTER — Other Ambulatory Visit: Payer: Self-pay | Admitting: Physician Assistant

## 2018-05-04 DIAGNOSIS — Z1231 Encounter for screening mammogram for malignant neoplasm of breast: Secondary | ICD-10-CM

## 2018-05-21 ENCOUNTER — Ambulatory Visit
Admission: RE | Admit: 2018-05-21 | Discharge: 2018-05-21 | Disposition: A | Discharge status: Home / Self Care | Payer: Medicare HMO | Source: Ambulatory Visit | Attending: Physician Assistant | Admitting: Physician Assistant

## 2018-05-21 DIAGNOSIS — Z1231 Encounter for screening mammogram for malignant neoplasm of breast: Secondary | ICD-10-CM | POA: Diagnosis not present

## 2018-10-26 DIAGNOSIS — Z0000 Encounter for general adult medical examination without abnormal findings: Secondary | ICD-10-CM | POA: Diagnosis not present

## 2018-10-26 DIAGNOSIS — Z6841 Body Mass Index (BMI) 40.0 and over, adult: Secondary | ICD-10-CM | POA: Diagnosis not present

## 2018-10-26 DIAGNOSIS — E119 Type 2 diabetes mellitus without complications: Secondary | ICD-10-CM | POA: Diagnosis not present

## 2018-10-26 DIAGNOSIS — I10 Essential (primary) hypertension: Secondary | ICD-10-CM | POA: Diagnosis not present

## 2018-10-26 DIAGNOSIS — E782 Mixed hyperlipidemia: Secondary | ICD-10-CM | POA: Diagnosis not present

## 2018-10-26 DIAGNOSIS — Z780 Asymptomatic menopausal state: Secondary | ICD-10-CM | POA: Diagnosis not present

## 2018-11-01 DIAGNOSIS — H35051 Retinal neovascularization, unspecified, right eye: Secondary | ICD-10-CM | POA: Diagnosis not present

## 2018-11-01 DIAGNOSIS — H35033 Hypertensive retinopathy, bilateral: Secondary | ICD-10-CM | POA: Diagnosis not present

## 2018-11-01 DIAGNOSIS — H35351 Cystoid macular degeneration, right eye: Secondary | ICD-10-CM | POA: Diagnosis not present

## 2018-11-01 DIAGNOSIS — E113292 Type 2 diabetes mellitus with mild nonproliferative diabetic retinopathy without macular edema, left eye: Secondary | ICD-10-CM | POA: Diagnosis not present

## 2018-11-02 DIAGNOSIS — H35033 Hypertensive retinopathy, bilateral: Secondary | ICD-10-CM | POA: Insufficient documentation

## 2018-11-02 DIAGNOSIS — H35051 Retinal neovascularization, unspecified, right eye: Secondary | ICD-10-CM | POA: Insufficient documentation

## 2018-11-02 DIAGNOSIS — H35351 Cystoid macular degeneration, right eye: Secondary | ICD-10-CM | POA: Insufficient documentation

## 2018-12-06 DIAGNOSIS — H35351 Cystoid macular degeneration, right eye: Secondary | ICD-10-CM | POA: Diagnosis not present

## 2019-02-25 DIAGNOSIS — L02619 Cutaneous abscess of unspecified foot: Secondary | ICD-10-CM | POA: Diagnosis not present

## 2019-02-25 DIAGNOSIS — W57XXXA Bitten or stung by nonvenomous insect and other nonvenomous arthropods, initial encounter: Secondary | ICD-10-CM | POA: Diagnosis not present

## 2019-02-25 DIAGNOSIS — L03115 Cellulitis of right lower limb: Secondary | ICD-10-CM | POA: Diagnosis not present

## 2019-02-25 DIAGNOSIS — S90861A Insect bite (nonvenomous), right foot, initial encounter: Secondary | ICD-10-CM | POA: Diagnosis not present

## 2019-03-01 DIAGNOSIS — H35351 Cystoid macular degeneration, right eye: Secondary | ICD-10-CM | POA: Diagnosis not present

## 2019-03-19 DIAGNOSIS — I10 Essential (primary) hypertension: Secondary | ICD-10-CM | POA: Diagnosis not present

## 2019-03-19 DIAGNOSIS — E782 Mixed hyperlipidemia: Secondary | ICD-10-CM | POA: Diagnosis not present

## 2019-03-19 DIAGNOSIS — S90561D Insect bite (nonvenomous), right ankle, subsequent encounter: Secondary | ICD-10-CM | POA: Diagnosis not present

## 2019-03-19 DIAGNOSIS — W57XXXD Bitten or stung by nonvenomous insect and other nonvenomous arthropods, subsequent encounter: Secondary | ICD-10-CM | POA: Diagnosis not present

## 2019-03-19 DIAGNOSIS — E119 Type 2 diabetes mellitus without complications: Secondary | ICD-10-CM | POA: Diagnosis not present

## 2019-04-04 DIAGNOSIS — H35351 Cystoid macular degeneration, right eye: Secondary | ICD-10-CM | POA: Diagnosis not present

## 2019-04-15 ENCOUNTER — Other Ambulatory Visit: Payer: Self-pay | Admitting: Physician Assistant

## 2019-04-15 DIAGNOSIS — Z1231 Encounter for screening mammogram for malignant neoplasm of breast: Secondary | ICD-10-CM

## 2019-04-23 DIAGNOSIS — E119 Type 2 diabetes mellitus without complications: Secondary | ICD-10-CM | POA: Diagnosis not present

## 2019-04-23 DIAGNOSIS — E782 Mixed hyperlipidemia: Secondary | ICD-10-CM | POA: Diagnosis not present

## 2019-04-23 DIAGNOSIS — I10 Essential (primary) hypertension: Secondary | ICD-10-CM | POA: Diagnosis not present

## 2019-04-30 DIAGNOSIS — E119 Type 2 diabetes mellitus without complications: Secondary | ICD-10-CM | POA: Diagnosis not present

## 2019-04-30 DIAGNOSIS — I10 Essential (primary) hypertension: Secondary | ICD-10-CM | POA: Diagnosis not present

## 2019-04-30 DIAGNOSIS — Z0000 Encounter for general adult medical examination without abnormal findings: Secondary | ICD-10-CM | POA: Diagnosis not present

## 2019-04-30 DIAGNOSIS — E782 Mixed hyperlipidemia: Secondary | ICD-10-CM | POA: Diagnosis not present

## 2019-04-30 DIAGNOSIS — Z1239 Encounter for other screening for malignant neoplasm of breast: Secondary | ICD-10-CM | POA: Diagnosis not present

## 2019-05-06 DIAGNOSIS — H35351 Cystoid macular degeneration, right eye: Secondary | ICD-10-CM | POA: Diagnosis not present

## 2019-05-27 ENCOUNTER — Ambulatory Visit
Admission: RE | Admit: 2019-05-27 | Discharge: 2019-05-27 | Disposition: A | Discharge status: Home / Self Care | Payer: Medicare HMO | Source: Ambulatory Visit | Attending: Physician Assistant | Admitting: Physician Assistant

## 2019-05-27 DIAGNOSIS — Z1231 Encounter for screening mammogram for malignant neoplasm of breast: Secondary | ICD-10-CM

## 2019-06-07 DIAGNOSIS — E113292 Type 2 diabetes mellitus with mild nonproliferative diabetic retinopathy without macular edema, left eye: Secondary | ICD-10-CM | POA: Diagnosis not present

## 2019-06-07 DIAGNOSIS — H35351 Cystoid macular degeneration, right eye: Secondary | ICD-10-CM | POA: Diagnosis not present

## 2019-06-07 DIAGNOSIS — H35033 Hypertensive retinopathy, bilateral: Secondary | ICD-10-CM | POA: Diagnosis not present

## 2019-06-07 DIAGNOSIS — H35051 Retinal neovascularization, unspecified, right eye: Secondary | ICD-10-CM | POA: Diagnosis not present

## 2019-07-05 DIAGNOSIS — H35351 Cystoid macular degeneration, right eye: Secondary | ICD-10-CM | POA: Diagnosis not present

## 2019-07-12 DIAGNOSIS — H401132 Primary open-angle glaucoma, bilateral, moderate stage: Secondary | ICD-10-CM | POA: Diagnosis not present

## 2019-07-12 DIAGNOSIS — H2513 Age-related nuclear cataract, bilateral: Secondary | ICD-10-CM | POA: Diagnosis not present

## 2019-07-12 DIAGNOSIS — H35051 Retinal neovascularization, unspecified, right eye: Secondary | ICD-10-CM | POA: Diagnosis not present

## 2019-08-02 DIAGNOSIS — H35351 Cystoid macular degeneration, right eye: Secondary | ICD-10-CM | POA: Diagnosis not present

## 2019-08-02 DIAGNOSIS — H401132 Primary open-angle glaucoma, bilateral, moderate stage: Secondary | ICD-10-CM | POA: Insufficient documentation

## 2019-08-30 DIAGNOSIS — H35033 Hypertensive retinopathy, bilateral: Secondary | ICD-10-CM | POA: Diagnosis not present

## 2019-08-30 DIAGNOSIS — E113292 Type 2 diabetes mellitus with mild nonproliferative diabetic retinopathy without macular edema, left eye: Secondary | ICD-10-CM | POA: Diagnosis not present

## 2019-08-30 DIAGNOSIS — H35351 Cystoid macular degeneration, right eye: Secondary | ICD-10-CM | POA: Diagnosis not present

## 2019-08-30 DIAGNOSIS — H35051 Retinal neovascularization, unspecified, right eye: Secondary | ICD-10-CM | POA: Diagnosis not present

## 2019-08-30 DIAGNOSIS — E119 Type 2 diabetes mellitus without complications: Secondary | ICD-10-CM | POA: Diagnosis not present

## 2019-10-24 DIAGNOSIS — I10 Essential (primary) hypertension: Secondary | ICD-10-CM | POA: Diagnosis not present

## 2019-10-24 DIAGNOSIS — E782 Mixed hyperlipidemia: Secondary | ICD-10-CM | POA: Diagnosis not present

## 2019-10-24 DIAGNOSIS — E119 Type 2 diabetes mellitus without complications: Secondary | ICD-10-CM | POA: Diagnosis not present

## 2019-11-04 DIAGNOSIS — E782 Mixed hyperlipidemia: Secondary | ICD-10-CM | POA: Diagnosis not present

## 2019-11-04 DIAGNOSIS — I10 Essential (primary) hypertension: Secondary | ICD-10-CM | POA: Diagnosis not present

## 2019-11-04 DIAGNOSIS — M1990 Unspecified osteoarthritis, unspecified site: Secondary | ICD-10-CM | POA: Diagnosis not present

## 2019-11-04 DIAGNOSIS — Z0000 Encounter for general adult medical examination without abnormal findings: Secondary | ICD-10-CM | POA: Diagnosis not present

## 2019-11-04 DIAGNOSIS — Z87891 Personal history of nicotine dependence: Secondary | ICD-10-CM | POA: Diagnosis not present

## 2019-11-04 DIAGNOSIS — E876 Hypokalemia: Secondary | ICD-10-CM | POA: Diagnosis not present

## 2019-11-04 DIAGNOSIS — E11311 Type 2 diabetes mellitus with unspecified diabetic retinopathy with macular edema: Secondary | ICD-10-CM | POA: Diagnosis not present

## 2019-11-04 IMAGING — MG MM DIGITAL SCREENING BILAT W/ TOMO W/ CAD
8 series · 8 of 24 positions shown · non-contrast
Comparison: Previous exam(s).

CLINICAL DATA: Screening.

EXAM:
DIGITAL SCREENING BILATERAL MAMMOGRAM WITH TOMO AND CAD

[L CC synth-2D]
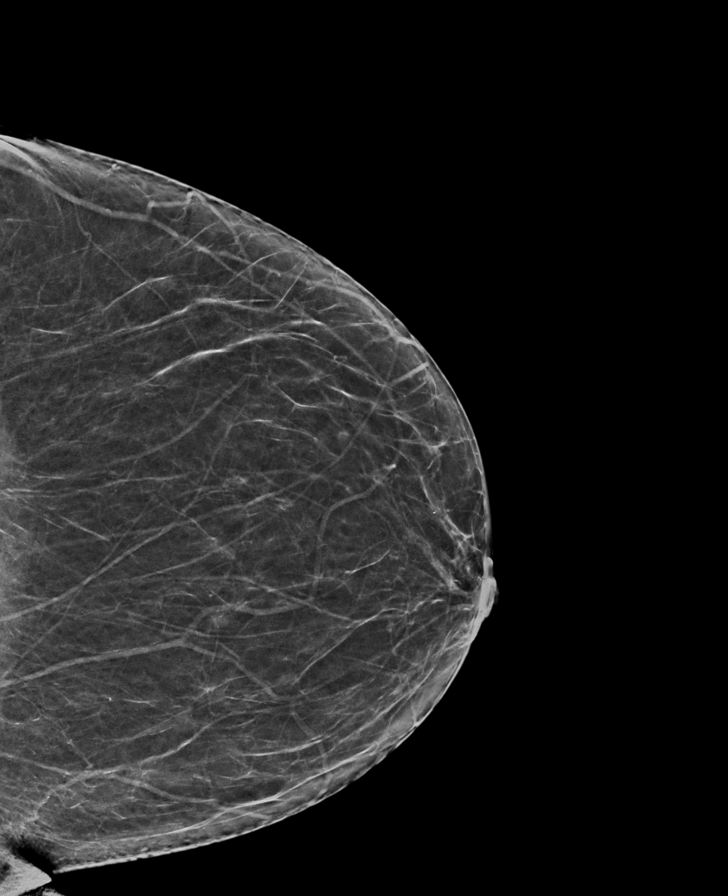

[L MLO synth-2D]
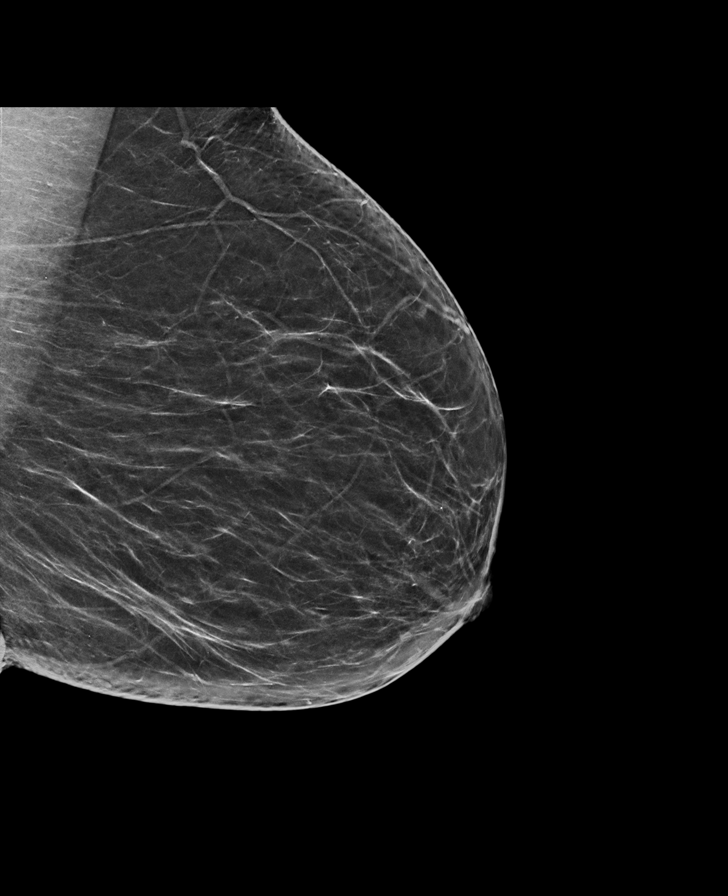

[R MLO synth-2D]
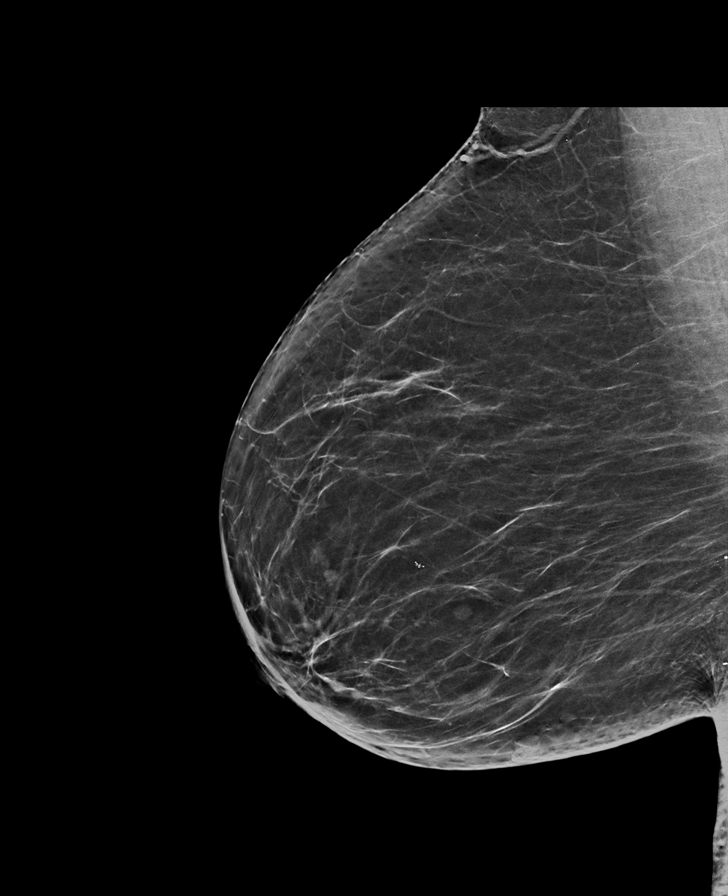

[R CC synth-2D]
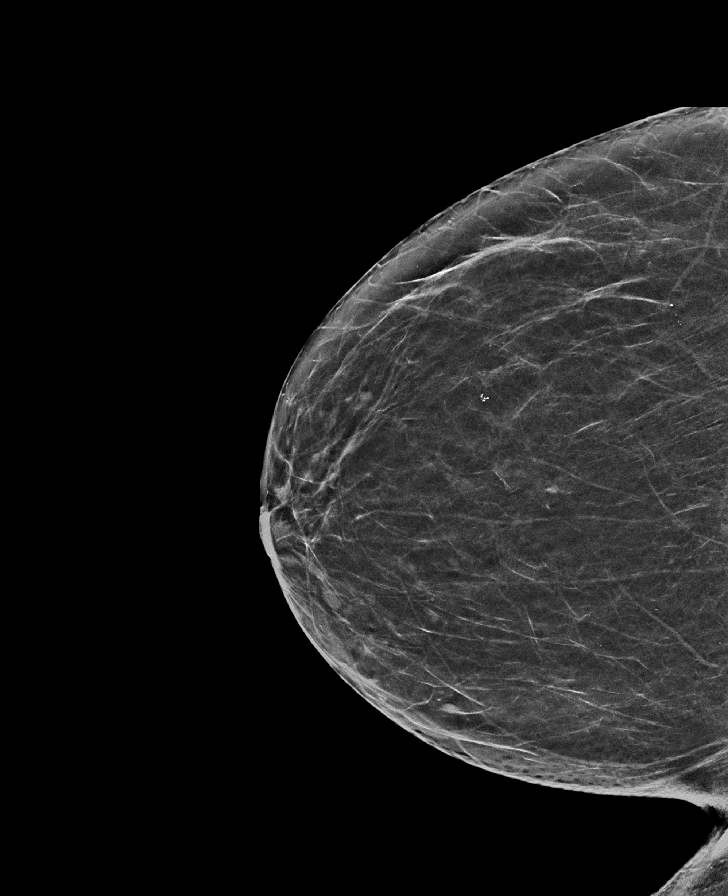

[L CC tomo · tomo slice 31/62.0]
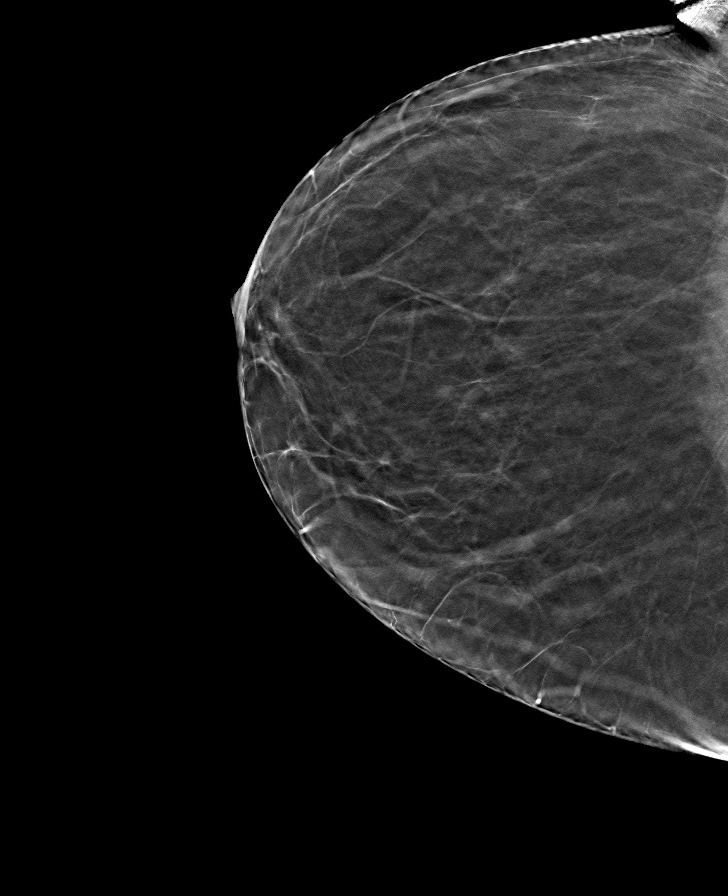

[R CC tomo · tomo slice 31/60.0]
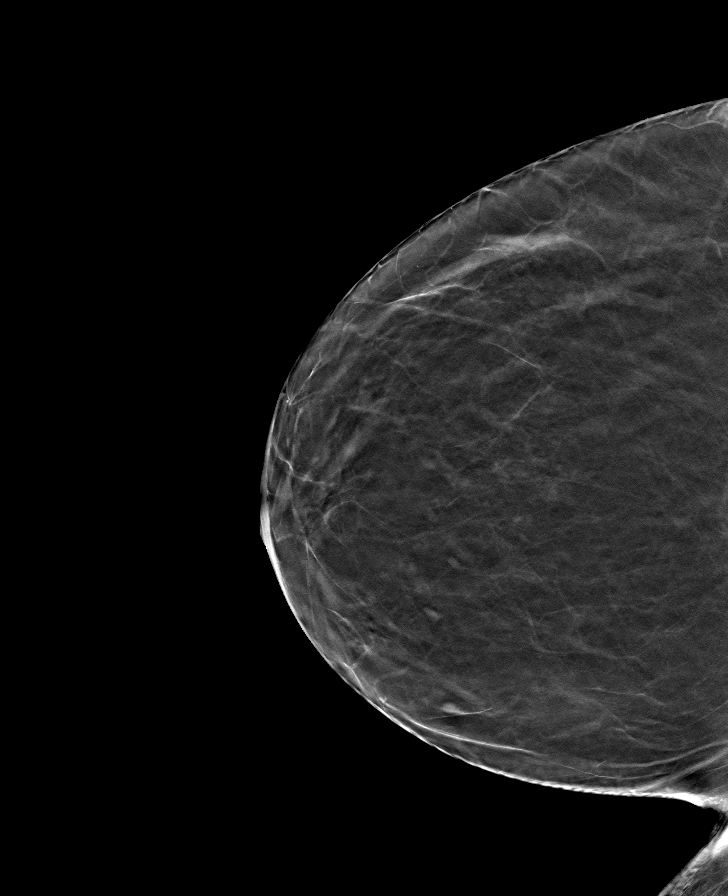

[R MLO tomo · tomo slice 35/70.0]
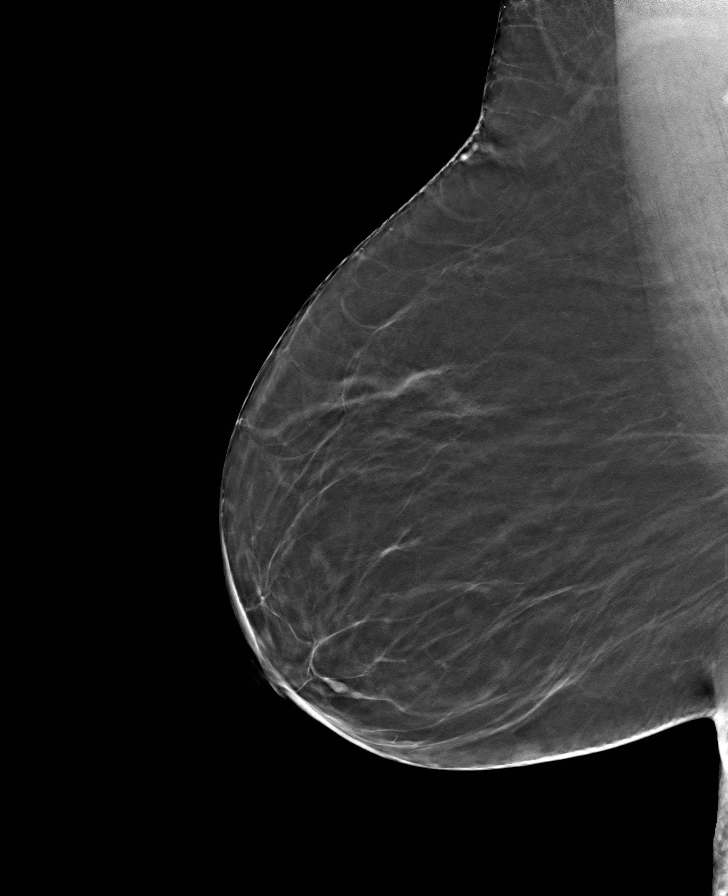

[L MLO tomo · tomo slice 35/69.0]
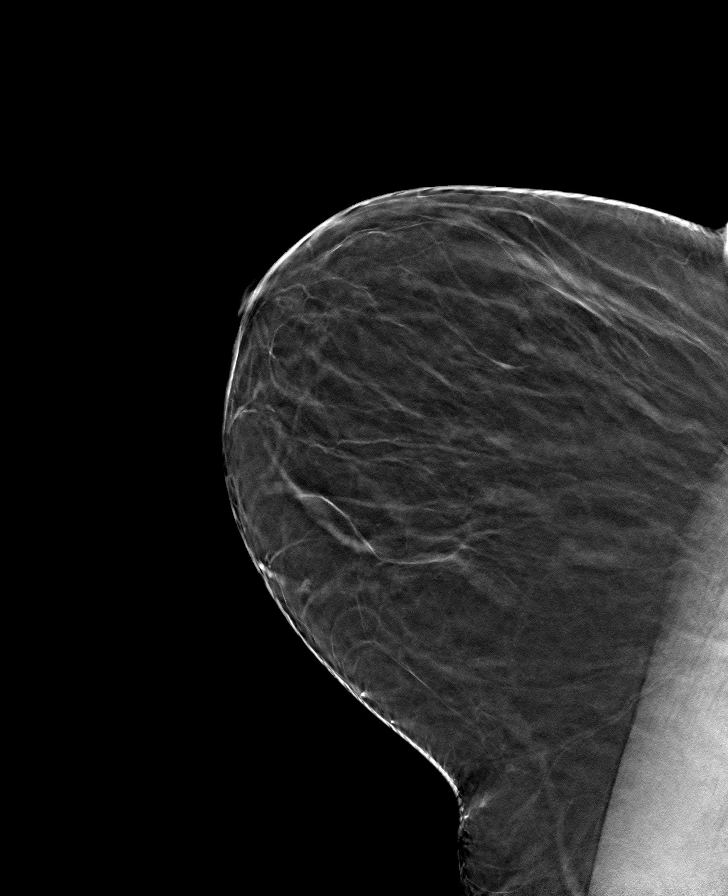

[8 of 24 positions shown; findings below may reference images not displayed]

ACR Breast Density Category b: There are scattered areas of
fibroglandular density.
FINDINGS: There are no findings suspicious for malignancy. Images were
processed with CAD.
IMPRESSION: No mammographic evidence of malignancy. A result letter of this
screening mammogram will be mailed directly to the patient.

RECOMMENDATION:
Screening mammogram in one year. (Code:CN-U-775)

BI-RADS CATEGORY  1: Negative.

## 2019-11-18 DIAGNOSIS — H35351 Cystoid macular degeneration, right eye: Secondary | ICD-10-CM | POA: Diagnosis not present

## 2020-01-09 DIAGNOSIS — H35351 Cystoid macular degeneration, right eye: Secondary | ICD-10-CM | POA: Diagnosis not present

## 2020-01-09 DIAGNOSIS — H35051 Retinal neovascularization, unspecified, right eye: Secondary | ICD-10-CM | POA: Diagnosis not present

## 2020-03-09 DIAGNOSIS — E113292 Type 2 diabetes mellitus with mild nonproliferative diabetic retinopathy without macular edema, left eye: Secondary | ICD-10-CM | POA: Diagnosis not present

## 2020-03-09 DIAGNOSIS — H35033 Hypertensive retinopathy, bilateral: Secondary | ICD-10-CM | POA: Diagnosis not present

## 2020-03-09 DIAGNOSIS — H35051 Retinal neovascularization, unspecified, right eye: Secondary | ICD-10-CM | POA: Diagnosis not present

## 2020-03-09 DIAGNOSIS — H35351 Cystoid macular degeneration, right eye: Secondary | ICD-10-CM | POA: Diagnosis not present

## 2020-04-22 ENCOUNTER — Other Ambulatory Visit: Payer: Self-pay | Admitting: Physician Assistant

## 2020-04-22 DIAGNOSIS — Z1231 Encounter for screening mammogram for malignant neoplasm of breast: Secondary | ICD-10-CM

## 2020-05-04 DIAGNOSIS — E782 Mixed hyperlipidemia: Secondary | ICD-10-CM | POA: Diagnosis not present

## 2020-05-04 DIAGNOSIS — E119 Type 2 diabetes mellitus without complications: Secondary | ICD-10-CM | POA: Diagnosis not present

## 2020-05-04 DIAGNOSIS — I10 Essential (primary) hypertension: Secondary | ICD-10-CM | POA: Diagnosis not present

## 2020-05-11 DIAGNOSIS — Z0000 Encounter for general adult medical examination without abnormal findings: Secondary | ICD-10-CM | POA: Diagnosis not present

## 2020-05-11 DIAGNOSIS — E1142 Type 2 diabetes mellitus with diabetic polyneuropathy: Secondary | ICD-10-CM | POA: Diagnosis not present

## 2020-05-11 DIAGNOSIS — I10 Essential (primary) hypertension: Secondary | ICD-10-CM | POA: Diagnosis not present

## 2020-05-11 DIAGNOSIS — E782 Mixed hyperlipidemia: Secondary | ICD-10-CM | POA: Diagnosis not present

## 2020-05-11 DIAGNOSIS — Z1231 Encounter for screening mammogram for malignant neoplasm of breast: Secondary | ICD-10-CM | POA: Diagnosis not present

## 2020-05-27 ENCOUNTER — Other Ambulatory Visit: Payer: Self-pay

## 2020-05-27 ENCOUNTER — Ambulatory Visit
Admission: RE | Admit: 2020-05-27 | Discharge: 2020-05-27 | Disposition: A | Discharge status: Home / Self Care | Payer: Medicare HMO | Source: Ambulatory Visit | Attending: Physician Assistant | Admitting: Physician Assistant

## 2020-05-27 DIAGNOSIS — Z1231 Encounter for screening mammogram for malignant neoplasm of breast: Secondary | ICD-10-CM | POA: Diagnosis not present

## 2020-07-23 DIAGNOSIS — H35351 Cystoid macular degeneration, right eye: Secondary | ICD-10-CM | POA: Diagnosis not present

## 2020-08-09 ENCOUNTER — Encounter: Payer: Self-pay | Admitting: Emergency Medicine

## 2020-08-09 ENCOUNTER — Inpatient Hospital Stay
Admission: EM | Admit: 2020-08-09 | Discharge: 2020-08-14 | DRG: 378 | Disposition: A | Discharge status: Home / Self Care | Payer: Medicare HMO | Attending: Internal Medicine | Admitting: Internal Medicine

## 2020-08-09 ENCOUNTER — Other Ambulatory Visit: Payer: Self-pay

## 2020-08-09 DIAGNOSIS — Z90710 Acquired absence of both cervix and uterus: Secondary | ICD-10-CM

## 2020-08-09 DIAGNOSIS — K5731 Diverticulosis of large intestine without perforation or abscess with bleeding: Principal | ICD-10-CM | POA: Diagnosis present

## 2020-08-09 DIAGNOSIS — I10 Essential (primary) hypertension: Secondary | ICD-10-CM

## 2020-08-09 DIAGNOSIS — E876 Hypokalemia: Secondary | ICD-10-CM | POA: Diagnosis not present

## 2020-08-09 DIAGNOSIS — K922 Gastrointestinal hemorrhage, unspecified: Secondary | ICD-10-CM

## 2020-08-09 DIAGNOSIS — I959 Hypotension, unspecified: Secondary | ICD-10-CM | POA: Diagnosis present

## 2020-08-09 DIAGNOSIS — D62 Acute posthemorrhagic anemia: Secondary | ICD-10-CM | POA: Diagnosis not present

## 2020-08-09 DIAGNOSIS — I771 Stricture of artery: Secondary | ICD-10-CM | POA: Diagnosis present

## 2020-08-09 DIAGNOSIS — E113292 Type 2 diabetes mellitus with mild nonproliferative diabetic retinopathy without macular edema, left eye: Secondary | ICD-10-CM

## 2020-08-09 DIAGNOSIS — I723 Aneurysm of iliac artery: Secondary | ICD-10-CM | POA: Diagnosis present

## 2020-08-09 DIAGNOSIS — Z66 Do not resuscitate: Secondary | ICD-10-CM | POA: Diagnosis not present

## 2020-08-09 DIAGNOSIS — Z7982 Long term (current) use of aspirin: Secondary | ICD-10-CM

## 2020-08-09 DIAGNOSIS — K449 Diaphragmatic hernia without obstruction or gangrene: Secondary | ICD-10-CM | POA: Diagnosis not present

## 2020-08-09 DIAGNOSIS — I472 Ventricular tachycardia: Secondary | ICD-10-CM | POA: Diagnosis not present

## 2020-08-09 DIAGNOSIS — K5900 Constipation, unspecified: Secondary | ICD-10-CM | POA: Diagnosis not present

## 2020-08-09 DIAGNOSIS — K921 Melena: Secondary | ICD-10-CM | POA: Diagnosis not present

## 2020-08-09 DIAGNOSIS — K5730 Diverticulosis of large intestine without perforation or abscess without bleeding: Secondary | ICD-10-CM | POA: Diagnosis not present

## 2020-08-09 DIAGNOSIS — Z87891 Personal history of nicotine dependence: Secondary | ICD-10-CM

## 2020-08-09 DIAGNOSIS — E860 Dehydration: Secondary | ICD-10-CM | POA: Diagnosis not present

## 2020-08-09 DIAGNOSIS — E119 Type 2 diabetes mellitus without complications: Secondary | ICD-10-CM

## 2020-08-09 DIAGNOSIS — I714 Abdominal aortic aneurysm, without rupture, unspecified: Secondary | ICD-10-CM

## 2020-08-09 DIAGNOSIS — T502X5A Adverse effect of carbonic-anhydrase inhibitors, benzothiadiazides and other diuretics, initial encounter: Secondary | ICD-10-CM | POA: Diagnosis present

## 2020-08-09 DIAGNOSIS — K625 Hemorrhage of anus and rectum: Secondary | ICD-10-CM | POA: Diagnosis not present

## 2020-08-09 DIAGNOSIS — Z23 Encounter for immunization: Secondary | ICD-10-CM

## 2020-08-09 DIAGNOSIS — Z20822 Contact with and (suspected) exposure to covid-19: Secondary | ICD-10-CM | POA: Diagnosis present

## 2020-08-09 DIAGNOSIS — Z885 Allergy status to narcotic agent status: Secondary | ICD-10-CM

## 2020-08-09 DIAGNOSIS — Z79899 Other long term (current) drug therapy: Secondary | ICD-10-CM

## 2020-08-09 DIAGNOSIS — R008 Other abnormalities of heart beat: Secondary | ICD-10-CM | POA: Diagnosis present

## 2020-08-09 DIAGNOSIS — K5790 Diverticulosis of intestine, part unspecified, without perforation or abscess without bleeding: Secondary | ICD-10-CM | POA: Diagnosis not present

## 2020-08-09 LAB — BASIC METABOLIC PANEL
Anion gap: 8 (ref 5–15)
BUN: 25 mg/dL — ABNORMAL HIGH (ref 8–23)
CO2: 26 mmol/L (ref 22–32)
Calcium: 8.8 mg/dL — ABNORMAL LOW (ref 8.9–10.3)
Chloride: 105 mmol/L (ref 98–111)
Creatinine, Ser: 1.21 mg/dL — ABNORMAL HIGH (ref 0.44–1.00)
GFR, Estimated: 47 mL/min — ABNORMAL LOW (ref >60)
Glucose, Bld: 143 mg/dL — ABNORMAL HIGH (ref 70–99)
Potassium: 3.3 mmol/L — ABNORMAL LOW (ref 3.5–5.1)
Sodium: 139 mmol/L (ref 135–145)

## 2020-08-09 LAB — CBC
HCT: 31.1 % — ABNORMAL LOW (ref 36.0–46.0)
Hemoglobin: 9.7 g/dL — ABNORMAL LOW (ref 12.0–15.0)
MCH: 25.9 pg — ABNORMAL LOW (ref 26.0–34.0)
MCHC: 31.2 g/dL (ref 30.0–36.0)
MCV: 82.9 fL (ref 80.0–100.0)
Platelets: 244 10*3/uL (ref 150–400)
RBC: 3.75 MIL/uL — ABNORMAL LOW (ref 3.87–5.11)
RDW: 13.7 % (ref 11.5–15.5)
WBC: 8.5 10*3/uL (ref 4.0–10.5)
nRBC: 0 % (ref 0.0–0.2)

## 2020-08-09 LAB — PROTIME-INR
INR: 1 (ref 0.8–1.2)
Prothrombin Time: 12.5 seconds (ref 11.4–15.2)

## 2020-08-09 LAB — HEMOGLOBIN AND HEMATOCRIT, BLOOD
HCT: 26.5 % — ABNORMAL LOW (ref 36.0–46.0)
Hemoglobin: 8.1 g/dL — ABNORMAL LOW (ref 12.0–15.0)

## 2020-08-09 LAB — HEMOGLOBIN A1C
Hgb A1c MFr Bld: 6.5 % — ABNORMAL HIGH (ref 4.8–5.6)
Mean Plasma Glucose: 139.85 mg/dL

## 2020-08-09 LAB — RESP PANEL BY RT-PCR (FLU A&B, COVID) ARPGX2
Influenza A by PCR: NEGATIVE
Influenza B by PCR: NEGATIVE
SARS Coronavirus 2 by RT PCR: NEGATIVE

## 2020-08-09 LAB — CBG MONITORING, ED
Glucose-Capillary: 97 mg/dL (ref 70–99)
Glucose-Capillary: 99 mg/dL (ref 70–99)

## 2020-08-09 MED ORDER — PANTOPRAZOLE SODIUM 40 MG IV SOLR
40.0000 mg | INTRAVENOUS | Status: DC
  Administered 2020-08-09 – 2020-08-13 (×4): 40 mg via INTRAVENOUS
  Filled 2020-08-09 (×4): qty 40

## 2020-08-09 MED ORDER — LACTATED RINGERS IV BOLUS
1000.0000 mL | Freq: Once | INTRAVENOUS | Status: AC
  Administered 2020-08-09: 1000 mL via INTRAVENOUS

## 2020-08-09 MED ORDER — SODIUM CHLORIDE 0.9 % IV SOLN: INTRAVENOUS | Status: DC

## 2020-08-09 MED ORDER — ONDANSETRON HCL 4 MG/2ML IJ SOLN: 4.0000 mg | Freq: Four times a day (QID) | INTRAMUSCULAR | Status: DC | PRN

## 2020-08-09 MED ORDER — POTASSIUM CHLORIDE IN NACL 20-0.9 MEQ/L-% IV SOLN
INTRAVENOUS | Status: DC
  Filled 2020-08-09 (×8): qty 1000

## 2020-08-09 MED ORDER — INFLUENZA VAC A&B SA ADJ QUAD 0.5 ML IM PRSY
0.5000 mL | PREFILLED_SYRINGE | INTRAMUSCULAR | Status: AC
  Administered 2020-08-12: 0.5 mL via INTRAMUSCULAR
  Filled 2020-08-09: qty 0.5

## 2020-08-09 MED ORDER — INSULIN ASPART 100 UNIT/ML ~~LOC~~ SOLN
0.0000 [IU] | SUBCUTANEOUS | Status: DC
  Administered 2020-08-11 – 2020-08-13 (×4): 3 [IU] via SUBCUTANEOUS
  Filled 2020-08-09 (×4): qty 1

## 2020-08-09 MED ORDER — PEG 3350-KCL-NA BICARB-NACL 420 G PO SOLR
4000.0000 mL | Freq: Once | ORAL | Status: AC
  Administered 2020-08-09: 4000 mL via ORAL
  Filled 2020-08-09: qty 4000

## 2020-08-09 MED ORDER — ONDANSETRON HCL 4 MG PO TABS: 4.0000 mg | ORAL_TABLET | Freq: Four times a day (QID) | ORAL | Status: DC | PRN

## 2020-08-09 NOTE — ED Notes (Signed)
Granddaughter at bedside. Pt resting in bed.

## 2020-08-09 NOTE — H&P (Signed)
History and Physical    Rose Perry MCN:[id number] DOB: 17-Jul-1946 DOA: 08/09/2020  PCP: Marinda Elk, MD   Patient coming from: Home  I have personally briefly reviewed patient's old medical records in [place]  Chief Complaint: Rectal bleeding  HPI: Rose Perry is a 74 y.o. female with medical history significant for hypertension and diabetes mellitus who presents to the emergency room for evaluation of rectal bleeding.  Patient states that her symptoms started 2 days prior to her presentation and she describes it as passage of dark blood with some stool in the commode.  On the day of her admission she has had 4 total bowel movements, maroon-colored and continued mostly blood and clots.  She had one witnessed episode in the emergency room.  She denies having any abdominal pain and has not had any episodes like this in the past.  She only takes aspirin and denies use of any other anticoagulants.  She denies having any history of hemorrhoids and does not have rectal pain. She complains of feeling dizzy and lightheaded but denies having any chest pain, no shortness of breath, no nausea, no vomiting, no diaphoresis, no headache, no fever, no chills, no cough Labs show sodium 139, potassium 3.3, chloride 105, bicarb 26, glucose 143, BUN 25, creatinine 1.2, calcium 8.8, white count 8.5, hemoglobin 9.7, hematocrit 31.1, MCV 82.9, RDW 13.7, platelet count 244, PT 12.5, INR 1.0 Respiratory viral panel still pending at the time of my H&P Twelve-lead EKG shows sinus rhythm with ventricular bigeminy.    ED Course: Patient is a 74 year old African-American female who presents to the emergency room for evaluation of rectal bleeding.  She complains of feeling dizzy and lightheaded and is noted to have an initial hemoglobin of 9.7g/dl compared to baseline of 13.4g/dl in August, 2021.  She continues to bleed and had an episode of rectal bleeding in the emergency room.  During my  evaluation she had a systolic blood pressure of 157mmHg and heart rate of 99.  She will be admitted to the hospital for further evaluation.  Review of Systems: As per HPI otherwise 10 point review of systems negative.    Past Medical History:  Diagnosis Date  . Arthritis   . Diabetes mellitus without complication ([place])   . Elevated lipids   . Hypertension     Past Surgical History:  Procedure Laterality Date  . ABDOMINAL HYSTERECTOMY    . COLONOSCOPY    . COLONOSCOPY WITH PROPOFOL N/A 08/22/2016   Procedure: COLONOSCOPY WITH PROPOFOL;  Surgeon: Manya Silvas, MD;  Location: [facility] ENDOSCOPY;  Service: Endoscopy;  Laterality: N/A;  . ltkr    . needle in foot removed    . REFRACTIVE SURGERY       reports that she has quit smoking. She has never used smokeless tobacco. She reports that she does not drink alcohol and does not use drugs.  Allergies  Allergen Reactions  . Codeine     Family History  Problem Relation Age of Onset  . Breast cancer Neg Hx      Prior to Admission medications   Medication Sig Start Date End Date Taking? Authorizing Provider  amLODipine (NORVASC) 5 MG tablet Take 5 mg by mouth daily.    [provider]  amoxicillin (AMOXIL) 500 MG capsule Take 500 mg by mouth 3 (three) times daily.    [provider]  aspirin EC 81 MG tablet Take 81 mg by mouth daily.    [provider]  atorvastatin (LIPITOR) 20 MG tablet Take 20 mg by mouth daily.    [provider]  triamcinolone cream (KENALOG) 0.1 % Apply 1 application topically 2 (two) times daily.    [provider]  triamterene-hydrochlorothiazide (MAXZIDE-25) 37.5-25 MG tablet Take 1 tablet by mouth daily.    [provider]    Physical Exam: Vitals:   08/09/20 0858 08/09/20 1115 08/09/20 1126  BP: 115/64 (!) 86/62 111/83  Pulse: 86 92   Resp: 20 16   SpO2: 98% 99%   Weight: 111.1 kg    Height: 5\' 7"  (1.702 m)       Vitals:   08/09/20 0858  08/09/20 1115 08/09/20 1126  BP: 115/64 (!) 86/62 111/83  Pulse: 86 92   Resp: 20 16   SpO2: 98% 99%   Weight: 111.1 kg    Height: 5\' 7"  (1.702 m)      Constitutional: NAD, alert and oriented x 3 Eyes: PERRL, lids and conjunctivae pallor ENMT: Mucous membranes are moist.  Neck: normal, supple, no masses, no thyromegaly Respiratory: clear to auscultation bilaterally, no wheezing, no crackles. Normal respiratory effort. No accessory muscle use.  Cardiovascular: Regular rate and rhythm, no murmurs / rubs / gallops. No extremity edema. 2+ pedal pulses. No carotid bruits.  Abdomen: no tenderness, no masses palpated. No hepatosplenomegaly. Bowel sounds positive. Central adiposity Musculoskeletal: no clubbing / cyanosis. No joint deformity upper and lower extremities.  Skin: no rashes, lesions, ulcers.  Neurologic: No gross focal neurologic deficit. Psychiatric: Normal mood and affect.   Labs on Admission: I have personally reviewed following labs and imaging studies  CBC: Recent Labs  Lab 08/09/20 0919  WBC 8.5  HGB 9.7*  HCT 31.1*  MCV 82.9  PLT 161   Basic Metabolic Panel: Recent Labs  Lab 08/09/20 0919  NA 139  K 3.3*  CL 105  CO2 26  GLUCOSE 143*  BUN 25*  CREATININE 1.21*  CALCIUM 8.8*   GFR: Estimated Creatinine Clearance: 51.6 mL/min (A) (by C-G formula based on SCr of 1.21 mg/dL (H)). Liver Function Tests: No results for input(s): AST, ALT, ALKPHOS, BILITOT, PROT, ALBUMIN in the last 168 hours. No results for input(s): LIPASE, AMYLASE in the last 168 hours. No results for input(s): AMMONIA in the last 168 hours. Coagulation Profile: Recent Labs  Lab 08/09/20 1145  INR 1.0   Cardiac Enzymes: No results for input(s): CKTOTAL, CKMB, CKMBINDEX, TROPONINI in the last 168 hours. BNP (last 3 results) No results for input(s): PROBNP in the last 8760 hours. HbA1C: No results for input(s): HGBA1C in the last 72 hours. CBG: No results for input(s): GLUCAP in  the last 168 hours. Lipid Profile: No results for input(s): CHOL, HDL, LDLCALC, TRIG, CHOLHDL, LDLDIRECT in the last 72 hours. Thyroid Function Tests: No results for input(s): TSH, T4TOTAL, FREET4, T3FREE, THYROIDAB in the last 72 hours. Anemia Panel: No results for input(s): VITAMINB12, FOLATE, FERRITIN, TIBC, IRON, RETICCTPCT in the last 72 hours. Urine analysis:    Component Value Date/Time   COLORURINE Straw 08/07/2013 0925   APPEARANCEUR Clear 08/07/2013 0925   LABSPEC 1.008 08/07/2013 0925   PHURINE 8.0 08/07/2013 0925   GLUCOSEU Negative 08/07/2013 0925   HGBUR Negative 08/07/2013 0925   BILIRUBINUR Negative 08/07/2013 0925   KETONESUR Negative 08/07/2013 0925   PROTEINUR Negative 08/07/2013 0925   NITRITE Negative 08/07/2013 0925   LEUKOCYTESUR Negative 08/07/2013 0925    Radiological Exams on Admission: No results found.  EKG: Independently reviewed.  Sinus rhythm Ventricular bigeminy   Assessment/Plan Principal Problem:   Acute blood loss anemia Active Problems:   Diabetes mellitus without complication (HCC)   Hypertension   Hypokalemia   Rectal bleed    Acute blood loss anemia Secondary to painless rectal bleed ??  Diverticular bleed Hold aspirin We will monitor serial H&H  We will transfuse for hemoglobin less than 7g/dl  We will consult GI  Keep patient NPO    Diabetes mellitus without complication We will keep patient NPO Check blood sugars every 4 hours Sliding scale insulin coverage    Hypokalemia Secondary to diuretic use Supplement potassium   Hypertension Hold amlodipine and triamterene/HCTZ for now due to relative hypotension      DVT prophylaxis: SCD Code Status: DNR Family Communication: Greater than 50% of time was spent discussing plan of care with patient and her granddaughter at the bedside.  All questions and concerns have been addressed.  They verbalized understanding and agree with the plan Disposition Plan: Back  to previous home environment Consults called: GI    Viva Gallaher MD [facility]     08/09/2020, 1:07 PM

## 2020-08-09 NOTE — Consult Note (Addendum)
Consultation  Referring Provider:   Dr. Francine Graven   Admit date: 11/28 Consult date: 11/28         Reason for Consultation:  Hematochezia         HPI:   Rose Perry is a 74 y.o. lady with history of hypertension and constipation who presents with 3 day history of painless hematochezia. Patient had small amount of blood Friday which has progressed to maroon stool with some black balls mixed in. Had a little dizziness after one bloody bowel movement. No blood thinner or family history of GI malignancies. History of hysterectomy in 1982. Had a colonoscopy in 2017 with pan-diverticulosis with 5 year follow-up recommended. Patient has severe constipation which she treats with epsom salts.   Past Medical History:  Diagnosis Date  . Arthritis   . Diabetes mellitus without complication ([place])   . Elevated lipids   . Hypertension     Past Surgical History:  Procedure Laterality Date  . ABDOMINAL HYSTERECTOMY    . COLONOSCOPY    . COLONOSCOPY WITH PROPOFOL N/A 08/22/2016   Procedure: COLONOSCOPY WITH PROPOFOL;  Surgeon: Manya Silvas, MD;  Location: [facility] ENDOSCOPY;  Service: Endoscopy;  Laterality: N/A;  . ltkr    . needle in foot removed    . REFRACTIVE SURGERY      Family History  Problem Relation Age of Onset  . Breast cancer Neg Hx     Social History   Tobacco Use  . Smoking status: Former Research scientist (life sciences)  . Smokeless tobacco: Never Used  Substance Use Topics  . Alcohol use: No  . Drug use: No    Prior to Admission medications   Medication Sig Start Date End Date Taking? Authorizing Provider  amLODipine (NORVASC) 5 MG tablet Take 5 mg by mouth daily.    [provider]  amoxicillin (AMOXIL) 500 MG capsule Take 500 mg by mouth 3 (three) times daily.    [provider]  aspirin EC 81 MG tablet Take 81 mg by mouth daily.    [provider]  atorvastatin (LIPITOR) 20 MG tablet Take 20 mg by mouth daily.    [provider]  triamcinolone cream  (KENALOG) 0.1 % Apply 1 application topically 2 (two) times daily.    [provider]  triamterene-hydrochlorothiazide (MAXZIDE-25) 37.5-25 MG tablet Take 1 tablet by mouth daily.    [provider]    Current Facility-Administered Medications  Medication Dose Route Frequency Provider Last Rate Last Admin  . 0.9 % NaCl with KCl 20 mEq/ L  infusion   Intravenous Continuous Agbata, Tochukwu, MD      . insulin aspart (novoLOG) injection 0-20 Units  0-20 Units Subcutaneous Q4H Agbata, Tochukwu, MD      . ondansetron (ZOFRAN) tablet 4 mg  4 mg Oral Q6H PRN Agbata, Tochukwu, MD       Or  . ondansetron (ZOFRAN) injection 4 mg  4 mg Intravenous Q6H PRN Agbata, Tochukwu, MD      . pantoprazole (PROTONIX) injection 40 mg  40 mg Intravenous Q24H Agbata, Tochukwu, MD   40 mg at 08/09/20 1314   Current Outpatient Medications  Medication Sig Dispense Refill  . amLODipine (NORVASC) 5 MG tablet Take 5 mg by mouth daily.    Marland Kitchen amoxicillin (AMOXIL) 500 MG capsule Take 500 mg by mouth 3 (three) times daily.    Marland Kitchen aspirin EC 81 MG tablet Take 81 mg by mouth daily.    Marland Kitchen atorvastatin (LIPITOR) 20 MG tablet Take  20 mg by mouth daily.    Marland Kitchen triamcinolone cream (KENALOG) 0.1 % Apply 1 application topically 2 (two) times daily.    Marland Kitchen triamterene-hydrochlorothiazide (MAXZIDE-25) 37.5-25 MG tablet Take 1 tablet by mouth daily.      Allergies as of 08/09/2020 - Review Complete 08/09/2020  Allergen Reaction Noted  . Codeine  08/19/2016     Review of Systems:    All systems reviewed and negative except where noted in HPI.  Review of Systems  Constitutional: Negative for chills, fever and weight loss.  Respiratory: Negative for shortness of breath.   Cardiovascular: Negative for chest pain.  Gastrointestinal: Positive for blood in stool and constipation. Negative for abdominal pain, heartburn, nausea and vomiting.  Skin: Negative for rash.  Neurological: Negative for focal weakness.   Psychiatric/Behavioral: Negative for substance abuse.  All other systems reviewed and are negative.     Physical Exam:  Vital signs in last 24 hours: Pulse Rate:  [86-92] 92 (11/28 1115) Resp:  [16-20] 16 (11/28 1115) BP: (86-115)/(62-83) 111/83 (11/28 1126) SpO2:  [98 %-99 %] 99 % (11/28 1115) Weight:  [111.1 kg] 111.1 kg (11/28 0858)   General:   Pleasant in NAD Head:  Normocephalic and atraumatic. Eyes:   No icterus.   Conjunctiva pink. Mouth: Mucosa pink moist, no lesions. Neck:  Supple; no masses felt Lungs:  No respiratory distress Abdomen:   Flat, soft, nondistended, nontender Msk:   No clubbing or cyanosis.  Neurologic:  Alert and  oriented x4;  No focal deficits Skin:  Warm, dry, pink without significant lesions or rashes. Psych:  Alert and cooperative. Normal affect.  LAB RESULTS: Recent Labs    08/09/20 0919  WBC 8.5  HGB 9.7*  HCT 31.1*  PLT 244   BMET Recent Labs    08/09/20 0919  NA 139  K 3.3*  CL 105  CO2 26  GLUCOSE 143*  BUN 25*  CREATININE 1.21*  CALCIUM 8.8*   LFT No results for input(s): PROT, ALBUMIN, AST, ALT, ALKPHOS, BILITOT, BILIDIR, IBILI in the last 72 hours. PT/INR Recent Labs    08/09/20 1145  LABPROT 12.5  INR 1.0    STUDIES: No results found.     Impression / Plan:   74 y/o lady with history of constipation and pan-diverticulosis on colonoscopy in 2017 here with maroon stool concerning for diverticular bleed, likely right-sided. Some report of black stool as well but no upper GI symptoms.  - two large bore IV's - trend CBC's - transfuse if hemoglobin < 7 - hold aspirin - maintain active type and screen - IV Fluids - clear liquid diet now - if hemodynamically significant bleeding overnight then would get CTA - will plan on EGD/Colonoscopy tomorrow - will order split prep, NPO at midnight besides prep  Please call with any questions or concerns.  Raylene Miyamoto MD, MPH [place]

## 2020-08-09 NOTE — ED Notes (Signed)
EDP at bedside. Pt denies use of anticoagulants. Pt states has had 4 bloody BMs today.

## 2020-08-09 NOTE — ED Provider Notes (Signed)
[facility] Emergency Department Provider Note   ____________________________________________   First MD Initiated Contact with Patient 08/09/20 1101     (approximate)  I have reviewed the triage vital signs and the nursing notes.   HISTORY  Chief Complaint Rectal Bleeding    HPI Rose Perry is a 74 y.o. female with past medical history of hypertension and diabetes who presents to the ED complaining of GI bleeding.  Patient reports that she first had a bloody bowel movement 2 days ago.  She had a second bloody bowel movement yesterday, but after waking up this morning has had increasing frequency and bloody bowel movements.  She reports 4 total bowel movements that have been dark red and contained mostly blood, including 1 here in the ED.  She has had occasional abdominal cramping, but denies any pain at this moment.  She has not had any fevers, nausea, or vomiting.  She denies history of similar GI bleeding and does not take any blood thinners.  She denies any rectal pain or history of hemorrhoids.        Past Medical History:  Diagnosis Date  . Arthritis   . Diabetes mellitus without complication ([place])   . Elevated lipids   . Hypertension     There are no problems to display for this patient.   Past Surgical History:  Procedure Laterality Date  . ABDOMINAL HYSTERECTOMY    . COLONOSCOPY    . COLONOSCOPY WITH PROPOFOL N/A 08/22/2016   Procedure: COLONOSCOPY WITH PROPOFOL;  Surgeon: Manya Silvas, MD;  Location: [facility] ENDOSCOPY;  Service: Endoscopy;  Laterality: N/A;  . ltkr    . needle in foot removed    . REFRACTIVE SURGERY      Prior to Admission medications   Medication Sig Start Date End Date Taking? Authorizing Provider  amLODipine (NORVASC) 5 MG tablet Take 5 mg by mouth daily.    [provider]  amoxicillin (AMOXIL) 500 MG capsule Take 500 mg by mouth 3 (three) times daily.    [provider]  aspirin EC 81 MG  tablet Take 81 mg by mouth daily.    [provider]  atorvastatin (LIPITOR) 20 MG tablet Take 20 mg by mouth daily.    [provider]  triamcinolone cream (KENALOG) 0.1 % Apply 1 application topically 2 (two) times daily.    [provider]  triamterene-hydrochlorothiazide (MAXZIDE-25) 37.5-25 MG tablet Take 1 tablet by mouth daily.    [provider]    Allergies Codeine  Family History  Problem Relation Age of Onset  . Breast cancer Neg Hx     Social History Social History   Tobacco Use  . Smoking status: Former Research scientist (life sciences)  . Smokeless tobacco: Never Used  Substance Use Topics  . Alcohol use: No  . Drug use: No    Review of Systems  Constitutional: No fever/chills Eyes: No visual changes. ENT: No sore throat. Cardiovascular: Denies chest pain. Respiratory: Denies shortness of breath. Gastrointestinal: No abdominal pain.  No nausea, no vomiting.  No diarrhea.  No constipation.  Positive for bloody stools. Genitourinary: Negative for dysuria. Musculoskeletal: Negative for back pain. Skin: Negative for rash. Neurological: Negative for headaches, focal weakness or numbness.  ____________________________________________   PHYSICAL EXAM:  VITAL SIGNS: ED Triage Vitals [08/09/20 0858]  Enc Vitals Group     BP 115/64     Pulse Rate 86     Resp 20     Temp  Temp src      SpO2 98 %     Weight 245 lb (111.1 kg)     Height 5\' 7"  (1.702 m)     Head Circumference      Peak Flow      Pain Score 0     Pain Loc      Pain Edu?      Excl. in [place]?     Constitutional: Alert and oriented. Eyes: Conjunctivae are normal. Head: Atraumatic. Nose: No congestion/rhinnorhea. Mouth/Throat: Mucous membranes are moist. Neck: Normal ROM Cardiovascular: Normal rate, irregularly irregular rhythm. Grossly normal heart sounds. Respiratory: Normal respiratory effort.  No retractions. Lungs CTAB. Gastrointestinal: Soft and nontender. No  distention. Genitourinary: deferred Musculoskeletal: No lower extremity tenderness nor edema. Neurologic:  Normal speech and language. No gross focal neurologic deficits are appreciated. Skin:  Skin is warm, dry and intact. No rash noted. Psychiatric: Mood and affect are normal. Speech and behavior are normal.      ____________________________________________   LABS (all labs ordered are listed, but only abnormal results are displayed)  Labs Reviewed  CBC - Abnormal; Notable for the following components:      Result Value   RBC 3.75 (*)    Hemoglobin 9.7 (*)    HCT 31.1 (*)    MCH 25.9 (*)    All other components within normal limits  BASIC METABOLIC PANEL - Abnormal; Notable for the following components:   Potassium 3.3 (*)    Glucose, Bld 143 (*)    BUN 25 (*)    Creatinine, Ser 1.21 (*)    Calcium 8.8 (*)    GFR, Estimated 47 (*)    All other components within normal limits  RESP PANEL BY RT-PCR (FLU A&B, COVID) [id number]  PROTIME-INR  HEMOGLOBIN AND HEMATOCRIT, BLOOD  TYPE AND SCREEN   ____________________________________________  EKG  ED ECG REPORT I, Blake Divine, the attending physician, personally viewed and interpreted this ECG.   Date: 08/09/2020  EKG Time: 11:29  Rate: 99  Rhythm: Sinus arrhythmia vs frequent PACs  Axis: Normal  Intervals:none  ST&T Change: None   PROCEDURES  Procedure(s) performed (including Critical Care):  .1-3 Lead EKG Interpretation Performed by: Blake Divine, MD Authorized by: Blake Divine, MD     Interpretation: abnormal     ECG rate:  86   ECG rate assessment: normal     Rhythm: sinus rhythm     Ectopy: bigeminy     Ectopy comment:  Frequent PACs   Conduction: normal       ____________________________________________   INITIAL IMPRESSION / ASSESSMENT AND PLAN / ED COURSE       74 year old female with past medical history of hypertension and diabetes presents to the ED with bloody bowel movement  starting 2 days ago.  These bowel movements were initially sparse but have picked up in frequency and quantity this morning.  Patient did have a large bloody bowel movement here in the ED but she remains hemodynamically stable.  Hemoglobin does appear to have dropped slightly from about 13 in August to 9.7 today.  We will hydrate with IV fluids and hold off on transfusion for now.  Plan to discuss with hospitalist for admission.  Case discussed with hospitalist and we will have patient admitted to progressive care for now but if repeat H&H is stable she may be appropriate for floor.      ____________________________________________   FINAL CLINICAL IMPRESSION(S) / ED DIAGNOSES  Final diagnoses:  Lower GI  bleed     ED Discharge Orders    None       Note:  This document was prepared using Dragon voice recognition software and may include unintentional dictation errors.   Blake Divine, MD 08/09/20 1201

## 2020-08-09 NOTE — ED Notes (Signed)
Type and screen redrawn and sent to lab.  

## 2020-08-09 NOTE — ED Notes (Signed)
Lab called, type and screen needs redraw because hemolyzed.

## 2020-08-09 NOTE — ED Notes (Signed)
Advised nurse that patient has assigned bed 

## 2020-08-09 NOTE — ED Notes (Signed)
Attending at bedside.

## 2020-08-09 NOTE — ED Notes (Addendum)
Pt states has had rectal bleeding since Friday 11/26, 2d ago. Started pink color, now red. Has never had rectal bleeding before. Denies NVD or abdo pain. Has had chronic constipation for years and takes epsom salts PO to help regulate BMs. Did eat red jello on Friday (2d ago) but nothing else red.

## 2020-08-09 NOTE — ED Notes (Signed)
Pt ambulated to toilet in room for BM. BM sample in container in toilet. Large amount soft stool with deep red color. Pt had steady gait and denied dizziness. Pt now back in bed. Granddaughter at bedside.

## 2020-08-09 NOTE — ED Notes (Signed)
Attempted CBG 2 times. Not able to obtain enough blood for sample. Will attempt repeat later on.

## 2020-08-09 NOTE — ED Triage Notes (Signed)
Pt reports rectal bleeding since Friday. Pt states Friday it was pink and now it is darker. Pt reports has little hard balls of stool with it. Pt denies pain. Pt states her bowel movements are not usually normal and she has to take meds to help her go.

## 2020-08-10 ENCOUNTER — Encounter: Payer: Self-pay | Admitting: Internal Medicine

## 2020-08-10 ENCOUNTER — Inpatient Hospital Stay: Payer: Medicare HMO

## 2020-08-10 LAB — GLUCOSE, CAPILLARY
Glucose-Capillary: 103 mg/dL — ABNORMAL HIGH (ref 70–99)
Glucose-Capillary: 105 mg/dL — ABNORMAL HIGH (ref 70–99)
Glucose-Capillary: 105 mg/dL — ABNORMAL HIGH (ref 70–99)
Glucose-Capillary: 106 mg/dL — ABNORMAL HIGH (ref 70–99)
Glucose-Capillary: 88 mg/dL (ref 70–99)
Glucose-Capillary: 92 mg/dL (ref 70–99)
Glucose-Capillary: 98 mg/dL (ref 70–99)
Glucose-Capillary: 99 mg/dL (ref 70–99)

## 2020-08-10 LAB — HEMOGLOBIN AND HEMATOCRIT, BLOOD
HCT: 19.7 % — ABNORMAL LOW (ref 36.0–46.0)
HCT: 21.8 % — ABNORMAL LOW (ref 36.0–46.0)
HCT: 21.8 % — ABNORMAL LOW (ref 36.0–46.0)
HCT: 22.6 % — ABNORMAL LOW (ref 36.0–46.0)
Hemoglobin: 6.1 g/dL — ABNORMAL LOW (ref 12.0–15.0)
Hemoglobin: 6.7 g/dL — ABNORMAL LOW (ref 12.0–15.0)
Hemoglobin: 7 g/dL — ABNORMAL LOW (ref 12.0–15.0)
Hemoglobin: 7.2 g/dL — ABNORMAL LOW (ref 12.0–15.0)

## 2020-08-10 LAB — COMPREHENSIVE METABOLIC PANEL
ALT: 11 U/L (ref 0–44)
AST: 18 U/L (ref 15–41)
Albumin: 2.4 g/dL — ABNORMAL LOW (ref 3.5–5.0)
Alkaline Phosphatase: 43 U/L (ref 38–126)
Anion gap: 8 (ref 5–15)
BUN: 26 mg/dL — ABNORMAL HIGH (ref 8–23)
CO2: 26 mmol/L (ref 22–32)
Calcium: 8 mg/dL — ABNORMAL LOW (ref 8.9–10.3)
Chloride: 106 mmol/L (ref 98–111)
Creatinine, Ser: 0.99 mg/dL (ref 0.44–1.00)
GFR, Estimated: 59 mL/min — ABNORMAL LOW (ref >60)
Glucose, Bld: 108 mg/dL — ABNORMAL HIGH (ref 70–99)
Potassium: 3.6 mmol/L (ref 3.5–5.1)
Sodium: 140 mmol/L (ref 135–145)
Total Bilirubin: 0.5 mg/dL (ref 0.3–1.2)
Total Protein: 4.6 g/dL — ABNORMAL LOW (ref 6.5–8.1)

## 2020-08-10 LAB — ABO/RH: ABO/RH(D): B POS

## 2020-08-10 LAB — PREPARE RBC (CROSSMATCH)

## 2020-08-10 MED ORDER — SODIUM CHLORIDE 0.9% IV SOLUTION: Freq: Once | INTRAVENOUS | Status: AC

## 2020-08-10 MED ORDER — IOHEXOL 350 MG/ML SOLN
100.0000 mL | Freq: Once | INTRAVENOUS | Status: AC | PRN
  Administered 2020-08-10: 100 mL via INTRAVENOUS

## 2020-08-10 MED ORDER — PEG 3350-KCL-NA BICARB-NACL 420 G PO SOLR
2000.0000 mL | Freq: Once | ORAL | Status: AC
  Administered 2020-08-11: 2000 mL via ORAL
  Filled 2020-08-10: qty 4000

## 2020-08-10 MED ORDER — ACETAMINOPHEN 325 MG PO TABS
650.0000 mg | ORAL_TABLET | Freq: Once | ORAL | Status: AC
  Administered 2020-08-10: 650 mg via ORAL
  Filled 2020-08-10: qty 2

## 2020-08-10 MED ORDER — DIPHENHYDRAMINE HCL 25 MG PO CAPS
25.0000 mg | ORAL_CAPSULE | Freq: Once | ORAL | Status: AC
  Administered 2020-08-10: 25 mg via ORAL
  Filled 2020-08-10: qty 1

## 2020-08-10 MED ORDER — IOHEXOL 300 MG/ML  SOLN: 100.0000 mL | Freq: Once | INTRAMUSCULAR | Status: DC | PRN

## 2020-08-10 MED ORDER — FUROSEMIDE 10 MG/ML IJ SOLN
20.0000 mg | Freq: Once | INTRAMUSCULAR | Status: AC
  Administered 2020-08-10: 20 mg via INTRAVENOUS
  Filled 2020-08-10: qty 2

## 2020-08-10 NOTE — Care Plan (Signed)
Hemoglobin 6.7 this morning and transfusion delayed so will have to push back procedure to tomorrow so procedure can be done when hemoglobin in a safe range. Can have clear liquids tonight and will re-order small amount of prep for tonight. Follow-up on CTA. If positive would consult vascular surgery. Otherwise planning on EGD/Colonoscopy tomorrow.  Raylene Miyamoto MD, MPH [place]

## 2020-08-10 NOTE — Plan of Care (Signed)
  Problem: Bowel/Gastric: Goal: Will show no signs and symptoms of gastrointestinal bleeding Outcome: Not Progressing   Problem: Fluid Volume: Goal: Will show no signs and symptoms of excessive bleeding Outcome: Not Progressing

## 2020-08-10 NOTE — Progress Notes (Signed)
2nd unit of blood now transfusing.

## 2020-08-10 NOTE — Progress Notes (Addendum)
Called blood bank and blood has not been allocated at this time.  They state blood should be ready within the next 30 mins.  Hgb at 6.1. Patient has had 3 medium/large liquid BMs today that are red in appearance. Dr. Verlon Au aware.

## 2020-08-10 NOTE — Progress Notes (Signed)
Blood has been allocated and transfusion has been started.

## 2020-08-10 NOTE — Progress Notes (Signed)
This evening the arm band would not scan for blood sugars. I was in there as witness to blood sugar testing: At 2000 the sugar was 99, there was no coverage. At 2342 the blood sugar was103, there was no coverage. At 0403 the blood sugar was 106, there was no coverage.

## 2020-08-10 NOTE — Progress Notes (Signed)
PROGRESS NOTE   Rose Perry  NWG:[id number] DOB: 08-02-1946 DOA: 08/09/2020 PCP: Marinda Elk, MD  Brief Narrative:  40 African-American female from home Known history HTN, DM TY 2, macular edema followed at [facility] status post L KR osteoarthritis 2014 Present to ED 11/28 2 days rectal bleed-for bowel movements maroon-colored mostly bloody clots-takes aspirin, - NSAIDs, + dizzy lightheaded States ate red Jell-O 11/26, 11/28 but unclear if this was the cause Prior colonoscopy 2017 pandiverticulosis-5-year follow-up Sodium 139 BUN/creatinine 25/1.2 hemoglobin down from baseline of 13-9 INR 1 EKG showed bigeminy Seen by Dr. Haig Prophet GI and plan for EGD/colonoscopy  Assessment & Plan:   Principal Problem:   Acute blood loss anemia Active Problems:   Diabetes mellitus without complication (HCC)   Hypertension   Hypokalemia   Rectal bleed   1. Melena a. N.p.o. bowel prep EGD colonoscopy as per GI-if no scope today defer to GI if can put on clear liquids-aspirin on hold indefinitely or as per GI 2. Anemia of blood loss a. Hemoglobin trending down to 6.7-giving 2 units PRBC today as cannot scope below 7 b. Recheck labs 3. AKI 2/2 volume loss ATN/hypokalemia a. Volume replete with NS@100 +20 K-labs a.m. + magnesium b. Hold Maxide, amlodipine 5 at this time-resume at discharge 4. DM TY 2 A1c this admission 6.5 a. Hold Metformin 500 twice daily b. CBG 98-108 continue sliding scale 5. Hypertension 6. Bigeminy a. See above discussion regarding holding med b. Keep on monitors today  Chronic illnesses 7. Macular edema  DVT prophylaxis: SCD Code Status: Full Family Communication: None present Disposition:  Status is: Inpatient  Remains inpatient appropriate because:Hemodynamically unstable, Persistent severe electrolyte disturbances and Unsafe d/c plan   Dispo: The patient is from: Home              Anticipated d/c is to: Home              Anticipated d/c date is: 2  days              Patient currently is not medically stable to d/c.       Consultants:   Gastroenterology Dr. Haig Prophet  Procedures: None yet  Antimicrobials: None   Subjective: Awake coherent no distress tells me he ate Jell-O over Thanksgiving and on Sunday and thinks it may been because Tells me effluent from bowel prep still quite red and she is concerned She has had no vomiting or coughing up of blood  Objective: Vitals:   08/09/20 1922 08/10/20 0358 08/10/20 0437 08/10/20 0745  BP: 108/73 111/63  (!) 124/58  Pulse: 82 76  73  Resp: 18 17  18   Temp: 97.8 F (36.6 C) (!) 97.4 F (36.3 C)  98 F (36.7 C)  TempSrc: Oral Oral  Oral  SpO2: 92% 93%  98%  Weight:   106.9 kg   Height:        Intake/Output Summary (Last 24 hours) at 08/10/2020 0802 Last data filed at 08/09/2020 2300 Gross per 24 hour  Intake 1738.82 ml  Output --  Net 1738.82 ml   Filed Weights   08/09/20 0858 08/10/20 0437  Weight: 111.1 kg 106.9 kg    Examination: Pleasant EOMI NCAT quite interactive Sits up spontaneously at the bedside EOMI NCAT no focal deficit CTA B no rales  Soft nontender no rebound no guarding Neurologically intact no focal deficit moving all 4 limbs   Data Reviewed: I have personally reviewed following labs and imaging studies BUN/creatinine 25/1.2-->26/0.9 (baseline  12/1.1) Hemoglobin 9.7-->6.7 (baseline 11.9) INR 1.0  Radiology Studies: No results found.   Scheduled Meds: . influenza vaccine adjuvanted  0.5 mL Intramuscular Tomorrow-1000  . insulin aspart  0-20 Units Subcutaneous Q4H  . pantoprazole (PROTONIX) IV  40 mg Intravenous Q24H   Continuous Infusions: . 0.9 % NaCl with KCl 20 mEq / L 100 mL/hr at 08/10/20 0229     LOS: 1 day    Time spent: [place], MD [facility] To contact the attending provider between 7A-7P or the covering provider during after hours 7P-7A, please log into the web site [email or web address] and access  using universal [place] password for that web site. If you do not have the password, please call the hospital operator.  08/10/2020, 8:02 AM

## 2020-08-11 ENCOUNTER — Inpatient Hospital Stay: Payer: Medicare HMO | Admitting: Anesthesiology

## 2020-08-11 ENCOUNTER — Encounter
Admission: EM | Disposition: A | Discharge status: Home / Self Care | Payer: Self-pay | Source: Home / Self Care | Attending: Internal Medicine

## 2020-08-11 HISTORY — PX: COLONOSCOPY WITH PROPOFOL: SHX5780

## 2020-08-11 HISTORY — PX: ESOPHAGOGASTRODUODENOSCOPY (EGD) WITH PROPOFOL: SHX5813

## 2020-08-11 LAB — CBC WITH DIFFERENTIAL/PLATELET
Abs Immature Granulocytes: 0.06 10*3/uL (ref 0.00–0.07)
Basophils Absolute: 0 10*3/uL (ref 0.0–0.1)
Basophils Relative: 0 %
Eosinophils Absolute: 0.4 10*3/uL (ref 0.0–0.5)
Eosinophils Relative: 4 %
HCT: 24.3 % — ABNORMAL LOW (ref 36.0–46.0)
Hemoglobin: 7.8 g/dL — ABNORMAL LOW (ref 12.0–15.0)
Immature Granulocytes: 1 %
Lymphocytes Relative: 25 %
Lymphs Abs: 2.5 10*3/uL (ref 0.7–4.0)
MCH: 26.6 pg (ref 26.0–34.0)
MCHC: 32.1 g/dL (ref 30.0–36.0)
MCV: 82.9 fL (ref 80.0–100.0)
Monocytes Absolute: 0.5 10*3/uL (ref 0.1–1.0)
Monocytes Relative: 5 %
Neutro Abs: 6.3 10*3/uL (ref 1.7–7.7)
Neutrophils Relative %: 65 %
Platelets: 178 10*3/uL (ref 150–400)
RBC: 2.93 MIL/uL — ABNORMAL LOW (ref 3.87–5.11)
RDW: 14.3 % (ref 11.5–15.5)
WBC: 9.8 10*3/uL (ref 4.0–10.5)
nRBC: 0.3 % — ABNORMAL HIGH (ref 0.0–0.2)

## 2020-08-11 LAB — COMPREHENSIVE METABOLIC PANEL
ALT: 10 U/L (ref 0–44)
AST: 17 U/L (ref 15–41)
Albumin: 2.8 g/dL — ABNORMAL LOW (ref 3.5–5.0)
Alkaline Phosphatase: 42 U/L (ref 38–126)
Anion gap: 8 (ref 5–15)
BUN: 15 mg/dL (ref 8–23)
CO2: 27 mmol/L (ref 22–32)
Calcium: 7.8 mg/dL — ABNORMAL LOW (ref 8.9–10.3)
Chloride: 106 mmol/L (ref 98–111)
Creatinine, Ser: 0.99 mg/dL (ref 0.44–1.00)
GFR, Estimated: 59 mL/min — ABNORMAL LOW (ref >60)
Glucose, Bld: 108 mg/dL — ABNORMAL HIGH (ref 70–99)
Potassium: 3 mmol/L — ABNORMAL LOW (ref 3.5–5.1)
Sodium: 141 mmol/L (ref 135–145)
Total Bilirubin: 0.5 mg/dL (ref 0.3–1.2)
Total Protein: 5.2 g/dL — ABNORMAL LOW (ref 6.5–8.1)

## 2020-08-11 LAB — HEMOGLOBIN AND HEMATOCRIT, BLOOD
HCT: 23.3 % — ABNORMAL LOW (ref 36.0–46.0)
HCT: 23.3 % — ABNORMAL LOW (ref 36.0–46.0)
HCT: 24.1 % — ABNORMAL LOW (ref 36.0–46.0)
Hemoglobin: 7.5 g/dL — ABNORMAL LOW (ref 12.0–15.0)
Hemoglobin: 7.6 g/dL — ABNORMAL LOW (ref 12.0–15.0)
Hemoglobin: 7.8 g/dL — ABNORMAL LOW (ref 12.0–15.0)

## 2020-08-11 LAB — GLUCOSE, CAPILLARY
Glucose-Capillary: 103 mg/dL — ABNORMAL HIGH (ref 70–99)
Glucose-Capillary: 110 mg/dL — ABNORMAL HIGH (ref 70–99)
Glucose-Capillary: 117 mg/dL — ABNORMAL HIGH (ref 70–99)
Glucose-Capillary: 124 mg/dL — ABNORMAL HIGH (ref 70–99)
Glucose-Capillary: 87 mg/dL (ref 70–99)

## 2020-08-11 LAB — MAGNESIUM: Magnesium: 1.9 mg/dL (ref 1.7–2.4)

## 2020-08-11 SURGERY — ESOPHAGOGASTRODUODENOSCOPY (EGD) WITH PROPOFOL
Anesthesia: General

## 2020-08-11 MED ORDER — LIDOCAINE HCL (CARDIAC) PF 50 MG/5ML IV SOSY
PREFILLED_SYRINGE | INTRAVENOUS | Status: DC | PRN
  Administered 2020-08-11: 50 mg via INTRAVENOUS

## 2020-08-11 MED ORDER — PHENYLEPHRINE HCL (PRESSORS) 10 MG/ML IV SOLN
INTRAVENOUS | Status: DC | PRN
  Administered 2020-08-11: 100 ug via INTRAVENOUS

## 2020-08-11 MED ORDER — PROPOFOL 500 MG/50ML IV EMUL
INTRAVENOUS | Status: AC
  Filled 2020-08-11: qty 50

## 2020-08-11 MED ORDER — METOPROLOL TARTRATE 25 MG PO TABS
12.5000 mg | ORAL_TABLET | Freq: Two times a day (BID) | ORAL | Status: DC
  Administered 2020-08-11 – 2020-08-14 (×6): 12.5 mg via ORAL
  Filled 2020-08-11 (×6): qty 1

## 2020-08-11 MED ORDER — PROPOFOL 500 MG/50ML IV EMUL
INTRAVENOUS | Status: DC | PRN
  Administered 2020-08-11: 125 ug/kg/min via INTRAVENOUS

## 2020-08-11 MED ORDER — PROPOFOL 10 MG/ML IV BOLUS
INTRAVENOUS | Status: DC | PRN
  Administered 2020-08-11: 80 mg via INTRAVENOUS

## 2020-08-11 MED ORDER — POTASSIUM CHLORIDE CRYS ER 20 MEQ PO TBCR
40.0000 meq | EXTENDED_RELEASE_TABLET | Freq: Every day | ORAL | Status: DC
  Administered 2020-08-11 – 2020-08-14 (×4): 40 meq via ORAL
  Filled 2020-08-11 (×4): qty 2

## 2020-08-11 NOTE — Anesthesia Preprocedure Evaluation (Addendum)
Anesthesia Evaluation  Patient identified by MRN, date of birth, ID band Patient awake    Reviewed: Allergy & Precautions, H&P , NPO status , Patient's Chart, lab work & pertinent test results  History of Anesthesia Complications Negative for: history of anesthetic complications  Airway Mallampati: II  TM Distance: >3 FB     Dental  (+) Upper Dentures   Pulmonary neg pulmonary ROS, neg sleep apnea, neg COPD, former smoker,    breath sounds clear to auscultation       Cardiovascular hypertension, (-) angina(-) Past MI and (-) Cardiac Stents (-) dysrhythmias  Rhythm:regular Rate:Normal     Neuro/Psych negative neurological ROS  negative psych ROS   GI/Hepatic negative GI ROS, Neg liver ROS,   Endo/Other  diabetes  Renal/GU negative Renal ROS  negative genitourinary   Musculoskeletal  (+) Arthritis ,   Abdominal   Peds  Hematology  (+) Blood dyscrasia, anemia , Hgb 7.8   Anesthesia Other Findings Obese  Past Medical History: No date: Arthritis No date: Diabetes mellitus without complication (HCC) No date: Elevated lipids No date: Hypertension  Past Surgical History: No date: ABDOMINAL HYSTERECTOMY No date: COLONOSCOPY 08/22/2016: COLONOSCOPY WITH PROPOFOL; N/A     Comment:  Procedure: COLONOSCOPY WITH PROPOFOL;  Surgeon: Manya Silvas, MD;  Location: [facility] ENDOSCOPY;  Service:               Endoscopy;  Laterality: N/A; No date: ltkr No date: needle in foot removed No date: REFRACTIVE SURGERY  BMI    Body Mass Index: 38.22 kg/m      Reproductive/Obstetrics negative OB ROS                           Anesthesia Physical Anesthesia Plan  ASA: II  Anesthesia Plan: General   Post-op Pain Management:    Induction:   PONV Risk Score and Plan: Propofol infusion and TIVA  Airway Management Planned: Simple Face Mask  Additional Equipment:   Intra-op Plan:    Post-operative Plan:   Informed Consent: I have reviewed the patients History and Physical, chart, labs and discussed the procedure including the risks, benefits and alternatives for the proposed anesthesia with the patient or authorized representative who has indicated his/her understanding and acceptance.     Dental Advisory Given  Plan Discussed with: Anesthesiologist, CRNA and Surgeon  Anesthesia Plan Comments:        Anesthesia Quick Evaluation

## 2020-08-11 NOTE — Op Note (Signed)
[facility] Gastroenterology Patient Name: Rose Perry Procedure Date: 08/11/2020 12:26 PM MRN: [id number] Account #: [id number] Date of Birth: [birth year] Admit Type: Inpatient Age: 74 Room: [facility] ENDO ROOM 1 Gender: Female Note Status: Finalized Procedure:             Colonoscopy Indications:           Hematochezia Providers:             Andrey Farmer MD, MD Referring MD:          Precious Bard, MD (Referring MD) Medicines:             Monitored Anesthesia Care Complications:         No immediate complications. Procedure:             Pre-Anesthesia Assessment:                        - Prior to the procedure, a History and Physical was                         performed, and patient medications and allergies were                         reviewed. The patient is competent. The risks and                         benefits of the procedure and the sedation options and                         risks were discussed with the patient. All questions                         were answered and informed consent was obtained.                         Patient identification and proposed procedure were                         verified by the physician, the nurse, the anesthetist                         and the technician in the endoscopy suite. Mental                         Status Examination: alert and oriented. Airway                         Examination: normal oropharyngeal airway and neck                         mobility. Respiratory Examination: clear to                         auscultation. CV Examination: normal. Prophylactic                         Antibiotics: The patient does not require prophylactic  antibiotics. Prior Anticoagulants: The patient has                         taken no previous anticoagulant or antiplatelet                         agents. ASA Grade Assessment: III - A patient with                         severe systemic  disease. After reviewing the risks and                         benefits, the patient was deemed in satisfactory                         condition to undergo the procedure. The anesthesia                         plan was to use monitored anesthesia care (MAC).                         Immediately prior to administration of medications,                         the patient was re-assessed for adequacy to receive                         sedatives. The heart rate, respiratory rate, oxygen                         saturations, blood pressure, adequacy of pulmonary                         ventilation, and response to care were monitored                         throughout the procedure. The physical status of the                         patient was re-assessed after the procedure.                        After obtaining informed consent, the colonoscope was                         passed under direct vision. Throughout the procedure,                         the patient's blood pressure, pulse, and oxygen                         saturations were monitored continuously. The                         Colonoscope was introduced through the anus and                         advanced to the the terminal ileum. The colonoscopy  was performed without difficulty. The patient                         tolerated the procedure well. The quality of the bowel                         preparation was poor. Findings:      The perianal and digital rectal examinations were normal.      The distal ileum contained red blood but mucosa was normal.      Red blood was found in the sigmoid colon, in the descending colon, in       the transverse colon, in the ascending colon and in the cecum.      Many small and large-mouthed diverticula were found in the sigmoid       colon, descending colon, transverse colon, ascending colon and cecum.      The exam was otherwise without abnormality on direct and  retroflexion       views. Impression:            - Preparation of the colon was poor.                        - Blood in the distal ileum.                        - Blood in the sigmoid colon, in the descending colon,                         in the transverse colon, in the ascending colon and in                         the cecum.                        - Diverticulosis in the sigmoid colon, in the                         descending colon, in the transverse colon, in the                         ascending colon and in the cecum.                        - The examination was otherwise normal on direct and                         retroflexion views.                        - No specimens collected. Recommendation:        - Return patient to hospital ward for ongoing care.                        - Advance diet as tolerated.                        - Continue present medications. Bleeding likely  secondary to diverticular bleed. Given blood in entire                         colon, suspect right sided but unable to say for sure.                        - Do a GI bleeding (tagged RBC) scan if symptoms                         persist. Procedure Code(s):     --- Professional ---                        [phone], Colonoscopy, flexible; diagnostic, including                         collection of specimen(s) by brushing or washing, when                         performed (separate procedure) Diagnosis Code(s):     --- Professional ---                        K92.2, Gastrointestinal hemorrhage, unspecified                        K92.1, Melena (includes Hematochezia)                        K57.30, Diverticulosis of large intestine without                         perforation or abscess without bleeding CPT copyright 2019 American [employer] Association. All rights reserved. The codes documented in this report are preliminary and upon coder review may  be revised to meet current compliance  requirements. Andrey Farmer, MD Andrey Farmer MD, MD 08/11/2020 1:10:34 PM Number of Addenda: 0 Note Initiated On: 08/11/2020 12:26 PM Scope Withdrawal Time: 0 hours 8 minutes 58 seconds  Total Procedure Duration: 0 hours 16 minutes 20 seconds  Estimated Blood Loss:  Estimated blood loss: none.      [facility]

## 2020-08-11 NOTE — Op Note (Addendum)
[facility] Gastroenterology Patient Name: Rose Perry Procedure Date: 08/11/2020 12:28 PM MRN: [id number] Account #: [id number] Date of Birth: 04/16/1946 Admit Type: Inpatient Age: 74 Room: [facility] ENDO ROOM 1 Gender: Female Note Status: Finalized Procedure:             Upper GI endoscopy Indications:           Gastrointestinal bleeding of unknown origin Providers:             Andrey Farmer MD, MD Referring MD:          Precious Bard, MD (Referring MD) Medicines:             Monitored Anesthesia Care Complications:         No immediate complications. Procedure:             Pre-Anesthesia Assessment:                        - Prior to the procedure, a History and Physical was                         performed, and patient medications and allergies were                         reviewed. The patient is competent. The risks and                         benefits of the procedure and the sedation options and                         risks were discussed with the patient. All questions                         were answered and informed consent was obtained.                         Patient identification and proposed procedure were                         verified by the physician, the nurse, the anesthetist                         and the technician in the endoscopy suite. Mental                         Status Examination: alert and oriented. Airway                         Examination: normal oropharyngeal airway and neck                         mobility. Respiratory Examination: clear to                         auscultation. CV Examination: normal. Prophylactic                         Antibiotics: The patient does not require prophylactic  antibiotics. Prior Anticoagulants: The patient has                         taken no previous anticoagulant or antiplatelet agents                         except for aspirin. ASA Grade Assessment: III - A                          patient with severe systemic disease. After reviewing                         the risks and benefits, the patient was deemed in                         satisfactory condition to undergo the procedure. The                         anesthesia plan was to use monitored anesthesia care                         (MAC). Immediately prior to administration of                         medications, the patient was re-assessed for adequacy                         to receive sedatives. The heart rate, respiratory                         rate, oxygen saturations, blood pressure, adequacy of                         pulmonary ventilation, and response to care were                         monitored throughout the procedure. The physical                         status of the patient was re-assessed after the                         procedure.                        After obtaining informed consent, the endoscope was                         passed under direct vision. Throughout the procedure,                         the patient's blood pressure, pulse, and oxygen                         saturations were monitored continuously. The Endoscope                         was introduced through the mouth, and advanced to the  second part of duodenum. The upper GI endoscopy was                         accomplished without difficulty. The patient tolerated                         the procedure well. Findings:      A small hiatal hernia was present.      The exam of the esophagus was otherwise normal.      The entire examined stomach was normal.      The examined duodenum was normal. Impression:            - Small hiatal hernia.                        - Normal stomach.                        - Normal examined duodenum.                        - No specimens collected. Recommendation:        - Discharge patient to home.                        - Continue present  medications.                        - Perform a colonoscopy today.                        - Advance diet as tolerated.                        - Return patient to hospital ward for ongoing care. Procedure Code(s):     --- Professional ---                        [phone], Esophagogastroduodenoscopy, flexible,                         transoral; diagnostic, including collection of                         specimen(s) by brushing or washing, when performed                         (separate procedure) CPT copyright 2019 American Medical Association. All rights reserved. The codes documented in this report are preliminary and upon coder review may  be revised to meet current compliance requirements. Andrey Farmer, MD Andrey Farmer MD, MD 08/11/2020 1:00:47 PM Number of Addenda: 0 Note Initiated On: 08/11/2020 12:28 PM Estimated Blood Loss:  Estimated blood loss: none.      [facility]

## 2020-08-11 NOTE — Care Plan (Signed)
EGD/Colonoscopy performed. Blood in entire colon with severe diverticulosis. Some blood in TI but likely reflux from colon. Likely diverticular bleed. Would continue to monitor for recurrent severe bleeding (expect a little old blood to come out given amount left in colon).  - if recurrent severe bleeding would recommend Tagged rbc scan - continue to monitor CBC's and transfuse if hemoglobin less than 7 - can advance diet  Please call with any questions or concerns.  Raylene Miyamoto MD, MPH [place]

## 2020-08-11 NOTE — Progress Notes (Signed)
PROGRESS NOTE   Rose Perry  QBH:[id number] DOB: Jan 10, 1946 DOA: 08/09/2020 PCP: Marinda Elk, MD  Brief Narrative:  16 African-American female from home Known history HTN, DM TY 2, macular edema followed at [facility] status post L KR osteoarthritis 2014 Present to ED 11/28 2 days rectal bleed-for bowel movements maroon-colored mostly bloody clots-takes aspirin, - NSAIDs, + dizzy lightheaded States ate red Jell-O 11/26, 11/28 but unclear if this was the cause Prior colonoscopy 2017 pandiverticulosis-5-year follow-up Sodium 139 BUN/creatinine 25/1.2 hemoglobin down from baseline of 13-9 INR 1 EKG showed bigeminy Seen by Dr. Haig Prophet GI and plan for EGD/colonoscopy  Assessment & Plan:   Principal Problem:   Acute blood loss anemia Active Problems:   Diabetes mellitus without complication (HCC)   Hypertension   Hypokalemia   Rectal bleed   1. Melena a. N.p.o.-scope as per gastroenterology later today-defer to them b. CT scan did not reveal any large bleeding episodes 2. Acute anemia of blood loss-possibly diverticular a. Hemoglobin above 7 after of 3 units PRBC b. Monitor for further large bleed and labs in a.m. 3. AKI 2/2 volume loss ATN/hypokalemia a. Volume replete with NS@100 +20 K-labs a.m. + magnesium b. Add K. Dur 40 mEq today c. Hold Maxide, amlodipine 5 at this time-resume at discharge 4. DM TY 2 A1c this admission 6.5 a. Hold Metformin 500 twice daily b. CBG 9 92-1 03 5. Hypertension 6. NSVT with activity earlier this morning several episodes after shower a. We will start metoprolol 12.5 twice daily b. Keep on monitors today and if recurrence may need discussion with regards planning and/or rate control may be cardiology input 7. Infrarenal abdominal aortic aneurysm 4.9 cm/2.9 right common iliac aneurysm a. Requires outpatient follow-up and discussion about repair closer to 5 cm b. Outpatient vascular follow-up  Chronic medical issues 8. Macular  edema  DVT prophylaxis: SCD Code Status: Full Family Communication: None present Disposition:  Status is: Inpatient  Remains inpatient appropriate because:Hemodynamically unstable, Persistent severe electrolyte disturbances and Unsafe d/c plan   Dispo: The patient is from: Home              Anticipated d/c is to: Home              Anticipated d/c date is: 2 days              Patient currently is not medically stable to d/c.       Consultants:   Gastroenterology Dr. Haig Prophet  Procedures: CT angiogram 11/30 without acute bleed showing dilated descending abdominal aorta  Antimicrobials: None   Subjective:  Continues with bloody red stools which was cleaned up earlier this morning Received prep last night and 1 cup this morning No nausea no vomiting feels overall well   Objective: Vitals:   08/10/20 1948 08/11/20 0516 08/11/20 0554 08/11/20 0827  BP: (!) 105/54 (!) 83/38  (!) 120/56  Pulse: 81 83  88  Resp: 16 20  18   Temp: (!) 97.5 F (36.4 C) 98.1 F (36.7 C)  98.1 F (36.7 C)  TempSrc:  Oral  Oral  SpO2: 96% 92%  92%  Weight:   110.7 kg   Height:        Intake/Output Summary (Last 24 hours) at 08/11/2020 0920 Last data filed at 08/10/2020 2300 Gross per 24 hour  Intake 3136.9 ml  Output --  Net 3136.9 ml   Filed Weights   08/09/20 0858 08/10/20 0437 08/11/20 0554  Weight: 111.1 kg 106.9 kg 110.7 kg  Examination:  Awake coherent very pleasant tells me stool this morning S1-S2 no murmur seems to be in sinus rhythm on monitors to my exam Abdomen is soft no rebound no guarding No lower extremity edema ROM intact no focal deficits with power 5/5 grossly-neurovascular neurological exam deferred Skin soft supple no swelling  Data Reviewed: I have personally reviewed following labs and imaging studies BUN/creatinine 25/1.2-->26/0.9-->15/0.99 (baseline 12/1.1) Hemoglobin 9.7-->6.1-->3 units PRBC-->7.8 (baseline 11.9) INR 1.0  Radiology  Studies: CT Angio Abd/Pel w/ and/or w/o  Result Date: 08/10/2020 CLINICAL DATA:  GI bleed with maroon stools and passage clots. EXAM: CT ANGIOGRAPHY ABDOMEN AND PELVIS WITH CONTRAST AND WITHOUT CONTRAST TECHNIQUE: Multidetector CT imaging of the abdomen and pelvis was performed using the standard protocol during bolus administration of intravenous contrast. Multiplanar reconstructed images and MIPs were obtained and reviewed to evaluate the vascular anatomy. CONTRAST:  132mL OMNIPAQUE IOHEXOL 350 MG/ML SOLN COMPARISON:  None. FINDINGS: VASCULAR Aorta: Fusiform infrarenal abdominal aortic aneurysm present with maximal AP diameter of 4.9 cm and transverse width of 4.5 cm. The aneurysm terminates at the aortic bifurcation. Infrarenal aneurysm neck measures 2.0 x 2.1 cm. No evidence of aneurysm rupture or dissection. There is some mural thrombus present within the aneurysm. Celiac: Greater than 70% proximal celiac stenosis with associated poststenotic dilatation of the celiac trunk measuring up to 11 mm in diameter. Distal branches are patent. Prominence of pancreaticoduodenal vessels that anastomose with the gastroduodenal artery may implicate retrograde flow via SMA supply as a collateral pathway. SMA: Normally patent with mild atherosclerosis of the SMA trunk. Renals: Bilateral single renal arteries present. Calcified plaque present in proximal segments without significant renal artery stenosis identified. IMA: The IMA remains patent with origin emanating off of the superior aspect of the infrarenal aortic aneurysm. Inflow: Right common iliac artery aneurysm measures up to 2.9 cm in greatest diameter and contains some mural thrombus. The common iliac lumen is normally patent. There likely is stenosis at the origin of the right internal iliac artery. The right external iliac artery is normally patent. Left-sided common, internal and external iliac arteries demonstrate atherosclerosis without aneurysmal disease or  significant obstructive disease. Proximal Outflow: Bilateral common femoral arteries demonstrate mild atherosclerosis. Bilateral femoral bifurcations are normally patent. Veins: Venous phase imaging demonstrates no venous abnormalities in the abdomen or pelvis. Visualized venous structures are all normally patent. Review of the MIP images confirms the above findings. NON-VASCULAR Lower chest: Bibasilar pulmonary scarring with probable component of mild interstitial disease. Focal area of scarring with mild bronchiectasis at the medial right lung base. Hepatobiliary: No focal liver abnormality is seen. No gallstones, gallbladder wall thickening, or biliary dilatation. Pancreas: Unremarkable. No pancreatic ductal dilatation or surrounding inflammatory changes. Spleen: Normal in size without focal abnormality. Adrenals/Urinary Tract: Adrenal glands are unremarkable. Kidneys are normal, without renal calculi, focal lesion, or hydronephrosis. Bladder is unremarkable. Stomach/Bowel: Diffuse diverticulosis of the colon with some fluid present in the colon. No evidence of active bleeding into the gastrointestinal tract on arterial or venous phases of post-contrast imaging. No evidence of bowel obstruction or visible lesions. Small hiatal hernia present. No evidence of free intraperitoneal air. No acute inflammatory process identified. Lymphatic: No enlarged abdominal or pelvic lymph nodes. Reproductive: Status post hysterectomy. No adnexal masses. Other: No abdominal wall hernia or abnormality. No abdominopelvic ascites. Musculoskeletal: Degenerative disc disease at L5-S1. IMPRESSION: 1. Fusiform infrarenal abdominal aortic aneurysm with maximal AP diameter of 4.9 cm and transverse width of 4.5 cm. No evidence of aneurysm rupture or  dissection. 2. 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 3. The IMA remains patent with origin  emanating off of the superior aspect of the infrarenal abdominal aortic aneurysm. 4. 2.9 cm right common iliac artery aneurysm. 5. No evidence of active bleeding into the gastrointestinal tract on arterial or venous phases of post-contrast imaging. 6. Diffuse diverticulosis of the colon with some fluid present in the colon. No acute inflammatory process is identified. 7. Bibasilar pulmonary scarring with probable component of mild interstitial lung disease. 8. Small hiatal hernia. Electronically Signed   By: Aletta Edouard M.D.   On: 08/10/2020 14:49     Scheduled Meds: . influenza vaccine adjuvanted  0.5 mL Intramuscular Tomorrow-1000  . insulin aspart  0-20 Units Subcutaneous Q4H  . pantoprazole (PROTONIX) IV  40 mg Intravenous Q24H  . potassium chloride  40 mEq Oral Daily   Continuous Infusions: . 0.9 % NaCl with KCl 20 mEq / L 100 mL/hr at 08/11/20 0622     LOS: 2 days    Time spent: 51  Nita Sells, MD [facility] To contact the attending provider between 7A-7P or the covering provider during after hours 7P-7A, please log into the web site [email or web address] and access using universal [place] password for that web site. If you do not have the password, please call the hospital operator.  08/11/2020, 9:20 AM

## 2020-08-11 NOTE — Transfer of Care (Signed)
Immediate Anesthesia Transfer of Care Note  Patient: Rose Perry  Procedure(s) Performed: ESOPHAGOGASTRODUODENOSCOPY (EGD) WITH PROPOFOL (N/A ) COLONOSCOPY WITH PROPOFOL (N/A )  Patient Location: PACU  Anesthesia Type:MAC  Level of Consciousness: awake, alert  and oriented  Airway & Oxygen Therapy: Patient Spontanous Breathing  Post-op Assessment: Report given to RN and Post -op Vital signs reviewed and stable  Post vital signs: stable  Last Vitals:  Vitals Value Taken Time  BP    Temp    Pulse 93 08/11/20 1302  Resp 26 08/11/20 1302  SpO2 99 % 08/11/20 1302  Vitals shown include unvalidated device data.  Last Pain:  Vitals:   08/11/20 1126  TempSrc: Oral  PainSc:          Complications: No complications documented.

## 2020-08-12 ENCOUNTER — Encounter: Payer: Self-pay | Admitting: Gastroenterology

## 2020-08-12 DIAGNOSIS — E876 Hypokalemia: Secondary | ICD-10-CM

## 2020-08-12 DIAGNOSIS — K922 Gastrointestinal hemorrhage, unspecified: Principal | ICD-10-CM

## 2020-08-12 DIAGNOSIS — I714 Abdominal aortic aneurysm, without rupture, unspecified: Secondary | ICD-10-CM

## 2020-08-12 LAB — CBC WITH DIFFERENTIAL/PLATELET
Abs Immature Granulocytes: 0.05 10*3/uL (ref 0.00–0.07)
Basophils Absolute: 0 10*3/uL (ref 0.0–0.1)
Basophils Relative: 0 %
Eosinophils Absolute: 0.4 10*3/uL (ref 0.0–0.5)
Eosinophils Relative: 5 %
HCT: 22.1 % — ABNORMAL LOW (ref 36.0–46.0)
Hemoglobin: 7.2 g/dL — ABNORMAL LOW (ref 12.0–15.0)
Immature Granulocytes: 1 %
Lymphocytes Relative: 29 %
Lymphs Abs: 2.6 10*3/uL (ref 0.7–4.0)
MCH: 27.3 pg (ref 26.0–34.0)
MCHC: 32.6 g/dL (ref 30.0–36.0)
MCV: 83.7 fL (ref 80.0–100.0)
Monocytes Absolute: 0.5 10*3/uL (ref 0.1–1.0)
Monocytes Relative: 5 %
Neutro Abs: 5.3 10*3/uL (ref 1.7–7.7)
Neutrophils Relative %: 60 %
Platelets: 194 10*3/uL (ref 150–400)
RBC: 2.64 MIL/uL — ABNORMAL LOW (ref 3.87–5.11)
RDW: 14.6 % (ref 11.5–15.5)
WBC: 8.9 10*3/uL (ref 4.0–10.5)
nRBC: 0.3 % — ABNORMAL HIGH (ref 0.0–0.2)

## 2020-08-12 LAB — HEMOGLOBIN AND HEMATOCRIT, BLOOD
HCT: 21.5 % — ABNORMAL LOW (ref 36.0–46.0)
HCT: 22.1 % — ABNORMAL LOW (ref 36.0–46.0)
HCT: 26.1 % — ABNORMAL LOW (ref 36.0–46.0)
Hemoglobin: 6.8 g/dL — ABNORMAL LOW (ref 12.0–15.0)
Hemoglobin: 7 g/dL — ABNORMAL LOW (ref 12.0–15.0)
Hemoglobin: 8.2 g/dL — ABNORMAL LOW (ref 12.0–15.0)

## 2020-08-12 LAB — COMPREHENSIVE METABOLIC PANEL
ALT: 10 U/L (ref 0–44)
AST: 17 U/L (ref 15–41)
Albumin: 2.5 g/dL — ABNORMAL LOW (ref 3.5–5.0)
Alkaline Phosphatase: 40 U/L (ref 38–126)
Anion gap: 6 (ref 5–15)
BUN: 9 mg/dL (ref 8–23)
CO2: 26 mmol/L (ref 22–32)
Calcium: 7.7 mg/dL — ABNORMAL LOW (ref 8.9–10.3)
Chloride: 111 mmol/L (ref 98–111)
Creatinine, Ser: 1.03 mg/dL — ABNORMAL HIGH (ref 0.44–1.00)
GFR, Estimated: 57 mL/min — ABNORMAL LOW (ref >60)
Glucose, Bld: 108 mg/dL — ABNORMAL HIGH (ref 70–99)
Potassium: 3.5 mmol/L (ref 3.5–5.1)
Sodium: 143 mmol/L (ref 135–145)
Total Bilirubin: 0.6 mg/dL (ref 0.3–1.2)
Total Protein: 4.8 g/dL — ABNORMAL LOW (ref 6.5–8.1)

## 2020-08-12 LAB — GLUCOSE, CAPILLARY
Glucose-Capillary: 105 mg/dL — ABNORMAL HIGH (ref 70–99)
Glucose-Capillary: 96 mg/dL (ref 70–99)
Glucose-Capillary: 149 mg/dL — ABNORMAL HIGH (ref 70–99)
Glucose-Capillary: 107 mg/dL — ABNORMAL HIGH (ref 70–99)
Glucose-Capillary: 141 mg/dL — ABNORMAL HIGH (ref 70–99)
Glucose-Capillary: 136 mg/dL — ABNORMAL HIGH (ref 70–99)

## 2020-08-12 LAB — PREPARE RBC (CROSSMATCH)

## 2020-08-12 MED ORDER — SODIUM CHLORIDE 0.9% IV SOLUTION: Freq: Once | INTRAVENOUS | Status: AC

## 2020-08-12 MED ORDER — SODIUM CHLORIDE 0.9 % IV SOLN
400.0000 mg | Freq: Once | INTRAVENOUS | Status: AC
  Administered 2020-08-12: 400 mg via INTRAVENOUS
  Filled 2020-08-12: qty 20

## 2020-08-12 NOTE — Progress Notes (Signed)
Patient ID: Rose Perry, female   DOB: 29-Jun-1946, 74 y.o.   MRN: [id number] [facility] PROGRESS NOTE  Rose Perry [id number] DOB: 07-18-1946 DOA: 08/09/2020 PCP: Marinda Elk, MD  HPI/Subjective: Patient feeling well and offers no complaints.  No abdominal pain.  Patient states that she did see a little bit of blood when she went to the bathroom but not much coming out.  Not like it was.  Feeling much better.  Admitted 11/28 with rectal bleeding.  Objective: Vitals:   08/12/20 0849 08/12/20 1137  BP: 135/62 (!) 128/49  Pulse: 68 69  Resp: 16 18  Temp: 98.4 F (36.9 C) 98 F (36.7 C)  SpO2: 97% 96%    Intake/Output Summary (Last 24 hours) at 08/12/2020 1404 Last data filed at 08/12/2020 1137 Gross per 24 hour  Intake 608 ml  Output 0 ml  Net 608 ml   Filed Weights   08/10/20 0437 08/11/20 0554 08/12/20 0339  Weight: 106.9 kg 110.7 kg 109.7 kg    ROS: Review of Systems  Respiratory: Negative for cough and shortness of breath.   Cardiovascular: Negative for chest pain.  Gastrointestinal: Negative for abdominal pain, nausea and vomiting.   Exam: Physical Exam HENT:     Head: Normocephalic.     Mouth/Throat:     Pharynx: No oropharyngeal exudate.  Eyes:     General: Lids are normal.     Conjunctiva/sclera: Conjunctivae normal.     Pupils: Pupils are equal, round, and reactive to light.  Cardiovascular:     Rate and Rhythm: Normal rate and regular rhythm.     Heart sounds: S1 normal and S2 normal. Murmur heard.  Systolic murmur is present with a grade of 2/6.   Pulmonary:     Breath sounds: No decreased breath sounds, wheezing, rhonchi or rales.  Abdominal:     Palpations: Abdomen is soft.     Tenderness: There is no abdominal tenderness.  Musculoskeletal:     Right lower leg: No swelling.     Left lower leg: No swelling.  Skin:    General: Skin is warm.     Findings: No rash.  Neurological:     Mental Status: She is alert and  oriented to person, place, and time.       Data Reviewed: Basic Metabolic Panel: Recent Labs  Lab 08/09/20 0919 08/10/20 0425 08/11/20 0540 08/12/20 0619  NA 139 140 141 143  K 3.3* 3.6 3.0* 3.5  CL 105 106 106 111  CO2 26 26 27 26   GLUCOSE 143* 108* 108* 108*  BUN 25* 26* 15 9  CREATININE 1.21* 0.99 0.99 1.03*  CALCIUM 8.8* 8.0* 7.8* 7.7*  MG  --   --  1.9  --    Liver Function Tests: Recent Labs  Lab 08/10/20 0425 08/11/20 0540 08/12/20 0619  AST 18 17 17   ALT 11 10 10   ALKPHOS 43 42 40  BILITOT 0.5 0.5 0.6  PROT 4.6* 5.2* 4.8*  ALBUMIN 2.4* 2.8* 2.5*   CBC: Recent Labs  Lab 08/09/20 0919 08/09/20 1927 08/11/20 0540 08/11/20 0540 08/11/20 1149 08/11/20 1811 08/12/20 0028 08/12/20 0619 08/12/20 1211  WBC 8.5  --  9.8  --   --   --   --  8.9  --   NEUTROABS  --   --  6.3  --   --   --   --  5.3  --   HGB 9.7*   < > 7.8*   < >  7.5* 7.8* 6.8* 7.2*  7.0* 8.2*  HCT 31.1*   < > 24.3*   < > 23.3* 24.1* 21.5* 22.1*  22.1* 26.1*  MCV 82.9  --  82.9  --   --   --   --  83.7  --   PLT 244  --  178  --   --   --   --  194  --    < > = values in this interval not displayed.    CBG: Recent Labs  Lab 08/11/20 2026 08/11/20 2356 08/12/20 0340 08/12/20 0842 08/12/20 1136  GLUCAP 124* 110* 105* 96 149*    Recent Results (from the past 240 hour(s))  Resp Panel by RT-PCR (Flu A&B, Covid) Nasopharyngeal Swab     Status: None   Collection Time: 08/09/20 11:45 AM   Specimen: Nasopharyngeal Swab; Nasopharyngeal(NP) swabs in vial transport medium  Result Value Ref Range Status   SARS Coronavirus 2 by RT PCR NEGATIVE NEGATIVE Final    Comment: (NOTE) SARS-CoV-2 target nucleic acids are NOT DETECTED.  The SARS-CoV-2 RNA is generally detectable in upper respiratory specimens during the acute phase of infection. The lowest concentration of SARS-CoV-2 viral copies this assay can detect is 138 copies/mL. A negative result does not preclude SARS-Cov-2 infection  and should not be used as the sole basis for treatment or other patient management decisions. A negative result may occur with  improper specimen collection/handling, submission of specimen other than nasopharyngeal swab, presence of viral mutation(s) within the areas targeted by this assay, and inadequate number of viral copies(<138 copies/mL). A negative result must be combined with clinical observations, patient history, and epidemiological information. The expected result is Negative.  Fact Sheet for Patients:  EntrepreneurPulse.com.au  Fact Sheet for Healthcare Providers:  IncredibleEmployment.be  This test is no t yet approved or cleared by the [place] FDA and  has been authorized for detection and/or diagnosis of SARS-CoV-2 by FDA under an Emergency Use Authorization (EUA). This EUA will remain  in effect (meaning this test can be used) for the duration of the COVID-19 declaration under Section 564(b)(1) of the Act, 21 U.S.C.section 360bbb-3(b)(1), unless the authorization is terminated  or revoked sooner.       Influenza A by PCR NEGATIVE NEGATIVE Final   Influenza B by PCR NEGATIVE NEGATIVE Final    Comment: (NOTE) The Xpert Xpress SARS-CoV-2/FLU/RSV plus assay is intended as an aid in the diagnosis of influenza from Nasopharyngeal swab specimens and should not be used as a sole basis for treatment. Nasal washings and aspirates are unacceptable for Xpert Xpress SARS-CoV-2/FLU/RSV testing.  Fact Sheet for Patients: EntrepreneurPulse.com.au  Fact Sheet for Healthcare Providers: IncredibleEmployment.be  This test is not yet approved or cleared by the [place] FDA and has been authorized for detection and/or diagnosis of SARS-CoV-2 by FDA under an Emergency Use Authorization (EUA). This EUA will remain in effect (meaning this test can be used) for the duration of the COVID-19 declaration  under Section 564(b)(1) of the Act, 21 U.S.C. section 360bbb-3(b)(1), unless the authorization is terminated or revoked.  Performed at [facility], [address] [place], [place] [address]      Studies: CT Angio Abd/Pel w/ and/or w/o  Result Date: 08/10/2020 CLINICAL DATA:  GI bleed with maroon stools and passage clots. EXAM: CT ANGIOGRAPHY ABDOMEN AND PELVIS WITH CONTRAST AND WITHOUT CONTRAST TECHNIQUE: Multidetector CT imaging of the abdomen and pelvis was performed using the standard protocol during bolus administration of intravenous  contrast. Multiplanar reconstructed images and MIPs were obtained and reviewed to evaluate the vascular anatomy. CONTRAST:  145mL OMNIPAQUE IOHEXOL 350 MG/ML SOLN COMPARISON:  None. FINDINGS: VASCULAR Aorta: Fusiform infrarenal abdominal aortic aneurysm present with maximal AP diameter of 4.9 cm and transverse width of 4.5 cm. The aneurysm terminates at the aortic bifurcation. Infrarenal aneurysm neck measures 2.0 x 2.1 cm. No evidence of aneurysm rupture or dissection. There is some mural thrombus present within the aneurysm. Celiac: Greater than 70% proximal celiac stenosis with associated poststenotic dilatation of the celiac trunk measuring up to 11 mm in diameter. Distal branches are patent. Prominence of pancreaticoduodenal vessels that anastomose with the gastroduodenal artery may implicate retrograde flow via SMA supply as a collateral pathway. SMA: Normally patent with mild atherosclerosis of the SMA trunk. Renals: Bilateral single renal arteries present. Calcified plaque present in proximal segments without significant renal artery stenosis identified. IMA: The IMA remains patent with origin emanating off of the superior aspect of the infrarenal aortic aneurysm. Inflow: Right common iliac artery aneurysm measures up to 2.9 cm in greatest diameter and contains some mural thrombus. The common iliac lumen is normally patent. There likely is stenosis  at the origin of the right internal iliac artery. The right external iliac artery is normally patent. Left-sided common, internal and external iliac arteries demonstrate atherosclerosis without aneurysmal disease or significant obstructive disease. Proximal Outflow: Bilateral common femoral arteries demonstrate mild atherosclerosis. Bilateral femoral bifurcations are normally patent. Veins: Venous phase imaging demonstrates no venous abnormalities in the abdomen or pelvis. Visualized venous structures are all normally patent. Review of the MIP images confirms the above findings. NON-VASCULAR Lower chest: Bibasilar pulmonary scarring with probable component of mild interstitial disease. Focal area of scarring with mild bronchiectasis at the medial right lung base. Hepatobiliary: No focal liver abnormality is seen. No gallstones, gallbladder wall thickening, or biliary dilatation. Pancreas: Unremarkable. No pancreatic ductal dilatation or surrounding inflammatory changes. Spleen: Normal in size without focal abnormality. Adrenals/Urinary Tract: Adrenal glands are unremarkable. Kidneys are normal, without renal calculi, focal lesion, or hydronephrosis. Bladder is unremarkable. Stomach/Bowel: Diffuse diverticulosis of the colon with some fluid present in the colon. No evidence of active bleeding into the gastrointestinal tract on arterial or venous phases of post-contrast imaging. No evidence of bowel obstruction or visible lesions. Small hiatal hernia present. No evidence of free intraperitoneal air. No acute inflammatory process identified. Lymphatic: No enlarged abdominal or pelvic lymph nodes. Reproductive: Status post hysterectomy. No adnexal masses. Other: No abdominal wall hernia or abnormality. No abdominopelvic ascites. Musculoskeletal: Degenerative disc disease at L5-S1. IMPRESSION: 1. Fusiform infrarenal abdominal aortic aneurysm with maximal AP diameter of 4.9 cm and transverse width of 4.5 cm. No evidence  of aneurysm rupture or dissection. 2. 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 3. The IMA remains patent with origin emanating off of the superior aspect of the infrarenal abdominal aortic aneurysm. 4. 2.9 cm right common iliac artery aneurysm. 5. No evidence of active bleeding into the gastrointestinal tract on arterial or venous phases of post-contrast imaging. 6. Diffuse diverticulosis of the colon with some fluid present in the colon. No acute inflammatory process is identified. 7. Bibasilar pulmonary scarring with probable component of mild interstitial lung disease. 8. Small hiatal hernia. Electronically Signed   By: Aletta Edouard M.D.   On: 08/10/2020 14:49    Scheduled Meds: . insulin aspart  0-20 Units Subcutaneous Q4H  .  metoprolol tartrate  12.5 mg Oral BID  . pantoprazole (PROTONIX) IV  40 mg Intravenous Q24H  . potassium chloride  40 mEq Oral Daily   Continuous Infusions: . iron sucrose      Assessment/Plan:  1. Acute blood loss anemia.  Had received 3 units of packed red blood cells already.  Receiving fourth unit of packed red blood cells this morning.  I will also give IV iron today.  Hemoglobin up from 7.2 to 8.2. 2. Lower GI bleed likely diverticular in nature.  Colonoscopy did show blood throughout the entire colon. 3. Dehydration.  Creatinine improved with IV fluids. 4. Type 2 diabetes mellitus.  Continue sliding scale insulin.  Holding Metformin.  Hemoglobin A1c 6.5 5. Essential hypertension.  On metoprolol secondary to nonsustained ventricular tachycardia 6. Nonsustained ventricular tachycardia likely electrolyte related.  Continue potassium supplementation. 7. AAA measuring 4.5 x 4.9 cm will need outpatient vascular follow-up. 8. Right iliac aneurysm measuring 2.9 cm will need vascular outpatient follow-up. 9. 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  Will need outpatient vascular  follow-up.        Code Status:     Code Status Orders  (From admission, onward)         Start     Ordered   08/09/20 1817  Limited resuscitation (code)  Continuous       Comments: Patient does not want to be intubated or placed on mechanical ventilation. She will like chest compressions  Question Answer Comment  In the event of cardiac or respiratory ARREST: Initiate Code Blue, Call Rapid Response Yes   In the event of cardiac or respiratory ARREST: Perform CPR Yes   In the event of cardiac or respiratory ARREST: Perform Intubation/Mechanical Ventilation No   In the event of cardiac or respiratory ARREST: Use NIPPV/BiPAp only if indicated Yes   In the event of cardiac or respiratory ARREST: Administer ACLS medications if indicated Yes   In the event of cardiac or respiratory ARREST: Perform Defibrillation or Cardioversion if indicated Yes   Comments CODE STATUS was discussed with patient and she requests to be placed on a DO NOT RESUSCITATE status      08/09/20 1818        Code Status History    Date Active Date Inactive Code Status Order ID Comments User Context   08/09/2020 1217 08/09/2020 1818 DNR [id number]  Collier Bullock, MD ED   08/09/2020 1204 08/09/2020 1217 Full Code [id number]  Collier Bullock, MD ED   Advance Care Planning Activity     Family Communication: Spoke with granddaughter on the phone Disposition Plan: Status is: Inpatient  Dispo: The patient is from: Home              Anticipated d/c is to: Home              Anticipated d/c date is: 1 to 2 days depending on further bleeding or not.              Patient currently received another unit of packed red blood cells this morning and will get IV iron today.    Consultants:  Gastroenterology  Procedures:  EGD and colonoscopy  Time spent: 28 minutes  Hillman Attig [employer]

## 2020-08-12 NOTE — Care Management Important Message (Signed)
Important Message  Patient Details  Name: Rose Perry MRN: [id number] Date of Birth: 06/01/1946   Medicare Important Message Given:  Yes     [name] 08/12/2020, 11:07 AM

## 2020-08-12 NOTE — Progress Notes (Signed)
GI Inpatient Follow-up Note  Subjective:  Small amount of hematochezia but states overall volume is going down. Otherwise in no acute distress.  Scheduled Inpatient Medications:  . insulin aspart  0-20 Units Subcutaneous Q4H  . metoprolol tartrate  12.5 mg Oral BID  . pantoprazole (PROTONIX) IV  40 mg Intravenous Q24H  . potassium chloride  40 mEq Oral Daily    Continuous Inpatient Infusions:   . iron sucrose      PRN Inpatient Medications:  ondansetron **OR** ondansetron (ZOFRAN) IV  Review of Systems:  Review of Systems  Constitutional: Negative for chills, fever and weight loss.  Eyes: Negative for pain.  Respiratory: Negative for shortness of breath.   Cardiovascular: Negative for chest pain.  Gastrointestinal: Positive for blood in stool. Negative for abdominal pain, constipation, diarrhea, heartburn, melena, nausea and vomiting.  Skin: Negative for rash.  Neurological: Negative for headaches.  Psychiatric/Behavioral: Negative for substance abuse.  All other systems reviewed and are negative.    Physical Examination: BP (!) 128/49 (BP Location: Right Arm)   Pulse 69   Temp 98 F (36.7 C) (Oral)   Resp 18   Ht 5\' 7"  (1.702 m)   Wt 109.7 kg   SpO2 96%   BMI 37.87 kg/m  Gen: NAD, alert and oriented x 4 HEENT: PEERLA, EOMI, Neck: supple, no JVD Chest: No respiratory distress Abd: soft, NT, ND Ext: no edema, well perfused with 2+ pulses, Skin: no rash or lesions noted Lymph: no lymphadenopathy  Data: Lab Results  Component Value Date   WBC 8.9 08/12/2020   HGB 8.2 (L) 08/12/2020   HCT 26.1 (L) 08/12/2020   MCV 83.7 08/12/2020   PLT 194 08/12/2020   Recent Labs  Lab 08/12/20 0028 08/12/20 0619 08/12/20 1211  HGB 6.8* 7.2*  7.0* 8.2*   Lab Results  Component Value Date   NA 143 08/12/2020   K 3.5 08/12/2020   CL 111 08/12/2020   CO2 26 08/12/2020   BUN 9 08/12/2020   CREATININE 1.03 (H) 08/12/2020   Lab Results  Component Value Date    ALT 10 08/12/2020   AST 17 08/12/2020   ALKPHOS 40 08/12/2020   BILITOT 0.6 08/12/2020   Recent Labs  Lab 08/09/20 1145  INR 1.0   Assessment/Plan: Ms. Eppinger is a 74 y.o. lady with likely resolving diverticular bleed. Hemodynamically stable.  Recommendations:  - if recurrent severe bleeding would recommend tagged rbc scan - continue to monitor CBC's and transfuse if hemoglobin less than 7 - will continue to monitor closely  Please call with any questions or concerns  Raylene Miyamoto MD, MPH [facility]

## 2020-08-13 LAB — BPAM RBC
Blood Product Expiration Date: 202112172359
Blood Product Expiration Date: 202112222359
Blood Product Expiration Date: 202112282359
Blood Product Expiration Date: 202112292359
Blood Product Expiration Date: 202112312359
ISSUE DATE / TIME: 202111291329
ISSUE DATE / TIME: 202111291644
ISSUE DATE / TIME: 202111292253
ISSUE DATE / TIME: 202112010824
Unit Type and Rh: 5100
Unit Type and Rh: 5100
Unit Type and Rh: 7300
Unit Type and Rh: 7300
Unit Type and Rh: 7300

## 2020-08-13 LAB — TYPE AND SCREEN
ABO/RH(D): B POS
Antibody Screen: NEGATIVE
Unit division: 0
Unit division: 0
Unit division: 0
Unit division: 0
Unit division: 0

## 2020-08-13 LAB — CBC WITH DIFFERENTIAL/PLATELET
Abs Immature Granulocytes: 0.09 10*3/uL — ABNORMAL HIGH (ref 0.00–0.07)
Basophils Absolute: 0 10*3/uL (ref 0.0–0.1)
Basophils Relative: 0 %
Eosinophils Absolute: 0.4 10*3/uL (ref 0.0–0.5)
Eosinophils Relative: 5 %
HCT: 26.8 % — ABNORMAL LOW (ref 36.0–46.0)
Hemoglobin: 8.4 g/dL — ABNORMAL LOW (ref 12.0–15.0)
Immature Granulocytes: 1 %
Lymphocytes Relative: 22 %
Lymphs Abs: 2 10*3/uL (ref 0.7–4.0)
MCH: 27.4 pg (ref 26.0–34.0)
MCHC: 31.3 g/dL (ref 30.0–36.0)
MCV: 87.3 fL (ref 80.0–100.0)
Monocytes Absolute: 0.5 10*3/uL (ref 0.1–1.0)
Monocytes Relative: 6 %
Neutro Abs: 6.1 10*3/uL (ref 1.7–7.7)
Neutrophils Relative %: 66 %
Platelets: 205 10*3/uL (ref 150–400)
RBC: 3.07 MIL/uL — ABNORMAL LOW (ref 3.87–5.11)
RDW: 15.3 % (ref 11.5–15.5)
WBC: 9.1 10*3/uL (ref 4.0–10.5)
nRBC: 0.9 % — ABNORMAL HIGH (ref 0.0–0.2)

## 2020-08-13 LAB — GLUCOSE, CAPILLARY
Glucose-Capillary: 128 mg/dL — ABNORMAL HIGH (ref 70–99)
Glucose-Capillary: 119 mg/dL — ABNORMAL HIGH (ref 70–99)
Glucose-Capillary: 139 mg/dL — ABNORMAL HIGH (ref 70–99)
Glucose-Capillary: 99 mg/dL (ref 70–99)
Glucose-Capillary: 145 mg/dL — ABNORMAL HIGH (ref 70–99)

## 2020-08-13 MED ORDER — PANTOPRAZOLE SODIUM 40 MG PO TBEC: 40.0000 mg | DELAYED_RELEASE_TABLET | Freq: Every day | ORAL | 0 refills | Status: AC

## 2020-08-13 MED ORDER — POLYETHYLENE GLYCOL 3350 17 G PO PACK
17.0000 g | PACK | Freq: Once | ORAL | Status: AC
  Administered 2020-08-13: 10:00:00 17 g via ORAL
  Filled 2020-08-13: qty 1

## 2020-08-13 MED ORDER — METOPROLOL TARTRATE 25 MG PO TABS
12.5000 mg | ORAL_TABLET | Freq: Two times a day (BID) | ORAL | 0 refills | Status: DC
Start: 2020-08-13 — End: 2024-08-06

## 2020-08-13 NOTE — Discharge Instructions (Signed)
Lower Gastrointestinal Bleeding  Lower gastrointestinal (GI) bleeding is the result of bleeding from the colon, rectum, or anal area. The colon is the last part of the digestive tract, where stool, also called feces, is formed. If you have lower GI bleeding, you may see blood in or on your stool. It may be bright red. Lower GI bleeding often stops without treatment. Continued or heavy bleeding needs emergency treatment at the hospital. What are the causes? Lower GI bleeding may be caused by:  A condition that causes pouches to form in the colon over time (diverticulosis).  Swelling and irritation (inflammation) in areas with diverticulosis (diverticulitis).  Inflammation of the colon (inflammatory bowel disease).  Swollen veins in the rectum (hemorrhoids).  Painful tears in the anus (anal fissures), often caused by passing hard stools.  Cancer of the colon or rectum.  Noncancerous growths (polyps) of the colon or rectum.  A bleeding disorder that impairs the formation of blood clots and causes easy bleeding (coagulopathy).  An abnormal weakening of a blood vessel where an artery and a vein come together (arteriovenous malformation). What increases the risk? You are more likely to develop this condition if:  You are older than 75 years of age.  You take aspirin or NSAIDs on a regular basis.  You take anticoagulant or antiplatelet drugs.  You have a history of high-dose X-ray treatment (radiation therapy) of the colon.  You recently had a colon polyp removed. What are the signs or symptoms? Symptoms of this condition include:  Bright red blood or blood clots coming from your rectum.  Bloody stools.  Black or maroon-colored stools.  Pain or cramping in the abdomen.  Weakness or dizziness.  Racing heartbeat. How is this diagnosed? This condition may be diagnosed based on:  Your symptoms and medical history.  A physical exam. During the exam, your health care  provider will check for signs of blood loss, such as low blood pressure and a rapid pulse.  Tests, such as: ? Flexible sigmoidoscopy. In this procedure, a flexible tube with a camera on the end is used to examine your anus and the first part of your colon to look for the source of bleeding. ? Colonoscopy. This is similar to a flexible sigmoidoscopy, but the camera can extend all the way to the uppermost part of your colon. ? Blood tests to measure your red blood cell count and to check for coagulopathy. ? An imaging study of your colon to look for a bleeding site. In some cases, you may have X-rays taken after a dye or radioactive substance is injected into your bloodstream (angiogram). How is this treated? Treatment for this condition depends on the cause of the bleeding. Heavy or persistent bleeding is treated at the hospital. Treatment may include:  Getting fluids through an IV tube inserted into one of your veins.  Getting blood through an IV tube (blood transfusion).  Stopping bleeding through high-heat coagulation, injections of certain medicines, or applying surgical clips. This can all be done during a colonoscopy.  Having a procedure that involves first doing an angiogram and then blocking blood flow to the bleeding site (embolization).  Stopping some of your regular medicines for a certain amount of time.  Having surgery to remove part of the colon. This may be needed if bleeding is severe and does not respond to other treatment. Follow these instructions at home:  Take over-the-counter and prescription medicines only as told by your health care provider. You may need to  avoid aspirin, NSAIDs, or other medicines that increase bleeding.  Eat foods that are high in fiber. This will help keep your stools soft. These foods include whole grains, legumes, fruits, and vegetables. Eating 1-3 prunes each day works well for many people.  Drink enough fluid to keep your urine clear or pale  yellow.  Keep all follow-up visits as told by your health care provider. This is important. Contact a health care provider if:  Your symptoms do not improve. Get help right away if:  Your bleeding increases.  You feel light-headed or you faint.  You feel weak.  You have severe cramps in your back or abdomen.  You pass large blood clots in your stool.  Your symptoms get worse. This information is not intended to replace advice given to you by your health care provider. Make sure you discuss any questions you have with your health care provider. Document Revised: 12/21/2018 Document Reviewed: 01/14/2016 Elsevier Patient Education  [place].

## 2020-08-13 NOTE — Anesthesia Postprocedure Evaluation (Signed)
Anesthesia Post Note  Patient: Rose Perry  Procedure(s) Performed: ESOPHAGOGASTRODUODENOSCOPY (EGD) WITH PROPOFOL (N/A ) COLONOSCOPY WITH PROPOFOL (N/A )  Patient location during evaluation: PACU Anesthesia Type: General Level of consciousness: awake and alert Pain management: pain level controlled Vital Signs Assessment: post-procedure vital signs reviewed and stable Respiratory status: spontaneous breathing, nonlabored ventilation and respiratory function stable Cardiovascular status: blood pressure returned to baseline and stable Postop Assessment: no apparent nausea or vomiting Anesthetic complications: no   No complications documented.   Last Vitals:  Vitals:   08/13/20 1044 08/13/20 1551  BP: 137/70 (!) 148/88  Pulse: 74 71  Resp: 18 18  Temp: 36.6 C 37.2 C  SpO2: 95% 97%    Last Pain:  Vitals:   08/13/20 0758  TempSrc: Oral  PainSc: 0-No pain                 Tera Mater

## 2020-08-13 NOTE — Progress Notes (Signed)
Patient ID: Rose Perry, female   DOB: 12-Aug-1946, 74 y.o.   MRN: [id number] [facility] PROGRESS NOTE  Rose Perry WJX:[id number] DOB: [birth year] DOA: 08/09/2020 PCP: Marinda Elk, MD  HPI/Subjective: Patient awaiting to have a bowel movement.  No bowel movement since colonoscopy.  Feels okay and offers no complaints.  Came in for GI bleed.  Objective: Vitals:   08/13/20 0758 08/13/20 1044  BP: (!) 126/98 137/70  Pulse: 70 74  Resp: 18 18  Temp: 98.4 F (36.9 C) 97.9 F (36.6 C)  SpO2: 99% 95%    Intake/Output Summary (Last 24 hours) at 08/13/2020 1539 Last data filed at 08/13/2020 1028 Gross per 24 hour  Intake 480 ml  Output --  Net 480 ml   Filed Weights   08/10/20 0437 08/11/20 0554 08/12/20 0339  Weight: 106.9 kg 110.7 kg 109.7 kg    ROS: Review of Systems  Respiratory: Negative for cough and shortness of breath.   Cardiovascular: Negative for chest pain.  Gastrointestinal: Negative for abdominal pain, nausea and vomiting.   Exam: Physical Exam HENT:     Head: Normocephalic.     Mouth/Throat:     Pharynx: No oropharyngeal exudate.  Eyes:     General: Lids are normal.     Conjunctiva/sclera: Conjunctivae normal.     Pupils: Pupils are equal, round, and reactive to light.  Cardiovascular:     Rate and Rhythm: Normal rate and regular rhythm.     Heart sounds: Normal heart sounds, S1 normal and S2 normal.  Pulmonary:     Breath sounds: No decreased breath sounds, wheezing, rhonchi or rales.  Abdominal:     Palpations: Abdomen is soft.     Tenderness: There is no abdominal tenderness.  Skin:    General: Skin is warm.     Findings: No rash.  Neurological:     Mental Status: She is alert and oriented to person, place, and time.       Data Reviewed: Basic Metabolic Panel: Recent Labs  Lab 08/09/20 0919 08/10/20 0425 08/11/20 0540 08/12/20 0619  NA 139 140 141 143  K 3.3* 3.6 3.0* 3.5  CL 105 106 106 111  CO2 26 26 27 26    GLUCOSE 143* 108* 108* 108*  BUN 25* 26* 15 9  CREATININE 1.21* 0.99 0.99 1.03*  CALCIUM 8.8* 8.0* 7.8* 7.7*  MG  --   --  1.9  --    Liver Function Tests: Recent Labs  Lab 08/10/20 0425 08/11/20 0540 08/12/20 0619  AST 18 17 17   ALT 11 10 10   ALKPHOS 43 42 40  BILITOT 0.5 0.5 0.6  PROT 4.6* 5.2* 4.8*  ALBUMIN 2.4* 2.8* 2.5*   CBC: Recent Labs  Lab 08/09/20 0919 08/09/20 1927 08/11/20 0540 08/11/20 1149 08/11/20 1811 08/12/20 0028 08/12/20 0619 08/12/20 1211 08/13/20 0527  WBC 8.5  --  9.8  --   --   --  8.9  --  9.1  NEUTROABS  --   --  6.3  --   --   --  5.3  --  6.1  HGB 9.7*   < > 7.8*   < > 7.8* 6.8* 7.2*  7.0* 8.2* 8.4*  HCT 31.1*   < > 24.3*   < > 24.1* 21.5* 22.1*  22.1* 26.1* 26.8*  MCV 82.9  --  82.9  --   --   --  83.7  --  87.3  PLT 244  --  178  --   --   --  194  --  205   < > = values in this interval not displayed.     CBG: Recent Labs  Lab 08/12/20 2211 08/12/20 2325 08/13/20 0356 08/13/20 0803 08/13/20 1151  GLUCAP 141* 136* 128* 119* 139*    Recent Results (from the past 240 hour(s))  Resp Panel by RT-PCR (Flu A&B, Covid) Nasopharyngeal Swab     Status: None   Collection Time: 08/09/20 11:45 AM   Specimen: Nasopharyngeal Swab; Nasopharyngeal(NP) swabs in vial transport medium  Result Value Ref Range Status   SARS Coronavirus 2 by RT PCR NEGATIVE NEGATIVE Final    Comment: (NOTE) SARS-CoV-2 target nucleic acids are NOT DETECTED.  The SARS-CoV-2 RNA is generally detectable in upper respiratory specimens during the acute phase of infection. The lowest concentration of SARS-CoV-2 viral copies this assay can detect is 138 copies/mL. A negative result does not preclude SARS-Cov-2 infection and should not be used as the sole basis for treatment or other patient management decisions. A negative result may occur with  improper specimen collection/handling, submission of specimen other than nasopharyngeal swab, presence of viral  mutation(s) within the areas targeted by this assay, and inadequate number of viral copies(<138 copies/mL). A negative result must be combined with clinical observations, patient history, and epidemiological information. The expected result is Negative.  Fact Sheet for Patients:  EntrepreneurPulse.com.au  Fact Sheet for Healthcare Providers:  IncredibleEmployment.be  This test is no t yet approved or cleared by the [place] FDA and  has been authorized for detection and/or diagnosis of SARS-CoV-2 by FDA under an Emergency Use Authorization (EUA). This EUA will remain  in effect (meaning this test can be used) for the duration of the COVID-19 declaration under Section 564(b)(1) of the Act, 21 U.S.C.section 360bbb-3(b)(1), unless the authorization is terminated  or revoked sooner.       Influenza A by PCR NEGATIVE NEGATIVE Final   Influenza B by PCR NEGATIVE NEGATIVE Final    Comment: (NOTE) The Xpert Xpress SARS-CoV-2/FLU/RSV plus assay is intended as an aid in the diagnosis of influenza from Nasopharyngeal swab specimens and should not be used as a sole basis for treatment. Nasal washings and aspirates are unacceptable for Xpert Xpress SARS-CoV-2/FLU/RSV testing.  Fact Sheet for Patients: EntrepreneurPulse.com.au  Fact Sheet for Healthcare Providers: IncredibleEmployment.be  This test is not yet approved or cleared by the [place] FDA and has been authorized for detection and/or diagnosis of SARS-CoV-2 by FDA under an Emergency Use Authorization (EUA). This EUA will remain in effect (meaning this test can be used) for the duration of the COVID-19 declaration under Section 564(b)(1) of the Act, 21 U.S.C. section 360bbb-3(b)(1), unless the authorization is terminated or revoked.  Performed at [facility], [address] [place], [place] [address]      Scheduled Meds: . insulin  aspart  0-20 Units Subcutaneous Q4H  . metoprolol tartrate  12.5 mg Oral BID  . pantoprazole (PROTONIX) IV  40 mg Intravenous Q24H  . potassium chloride  40 mEq Oral Daily    Assessment/Plan:  1. Acute blood loss anemia.  Received a total of 4 units of packed red blood cells and also IV iron.  Hemoglobin stable at 8.4.  Gastroenterology does want to wait till the patient has a bowel movement until deciding on discharge because she did have blood in the entire colon.  Hold aspirin at this time 2. Lower GI bleed likely diverticular in nature.  Colonoscopy did show blood throughout the entire colon.  Awaiting bowel movement prior to disposition.  Hold aspirin at this time 3. Dehydration.  Improved with IV fluids 4. Type 2 diabetes mellitus.  Can go back on Metformin as outpatient.  Hemoglobin A1c 6.5 5. Essential hypertension.  Blood pressure medications changed to metoprolol secondary to nonsustained ventricular tachycardia 6. Nonsustained ventricular tachycardia likely electrolyte related.  Continue potassium supplementation and going home.  Holding hydrochlorothiazide. 7. AAA measuring 4.5 x 4.9 cm will need outpatient follow-up, right iliac artery aneurysm measuring 2.9 cm and also will need outpatient follow-up.  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  Will need outpatient vascular follow-up    Code Status:     Code Status Orders  (From admission, onward)         Start     Ordered   08/09/20 1817  Limited resuscitation (code)  Continuous       Comments: Patient does not want to be intubated or placed on mechanical ventilation. She will like chest compressions  Question Answer Comment  In the event of cardiac or respiratory ARREST: Initiate Code Blue, Call Rapid Response Yes   In the event of cardiac or respiratory ARREST: Perform CPR Yes   In the event of cardiac or respiratory ARREST: Perform Intubation/Mechanical Ventilation No   In the event of cardiac or respiratory ARREST: Use NIPPV/BiPAp  only if indicated Yes   In the event of cardiac or respiratory ARREST: Administer ACLS medications if indicated Yes   In the event of cardiac or respiratory ARREST: Perform Defibrillation or Cardioversion if indicated Yes   Comments CODE STATUS was discussed with patient and she requests to be placed on a DO NOT RESUSCITATE status      08/09/20 1818        Code Status History    Date Active Date Inactive Code Status Order ID Comments User Context   08/09/2020 1217 08/09/2020 1818 DNR [id number]  Collier Bullock, MD ED   08/09/2020 1204 08/09/2020 1217 Full Code [id number]  Collier Bullock, MD ED   Advance Care Planning Activity     Family Communication: Spoke with granddaughter on the phone Disposition Plan: Status is: Inpatient  Dispo: The patient is from: Home              Anticipated d/c is to: Home              Anticipated d/c date is: Potentially today if has a bowel movement without blood.  Potentially tomorrow if no bowel movement today.              Patient currently awaiting to have a bowel movement to evaluate for bleeding.  Consultants:  Gastroenterology  Time spent: 28 minutes  [place]

## 2020-08-14 DIAGNOSIS — E860 Dehydration: Secondary | ICD-10-CM

## 2020-08-14 LAB — GLUCOSE, CAPILLARY
Glucose-Capillary: 102 mg/dL — ABNORMAL HIGH (ref 70–99)
Glucose-Capillary: 109 mg/dL — ABNORMAL HIGH (ref 70–99)
Glucose-Capillary: 95 mg/dL (ref 70–99)

## 2020-08-14 LAB — HEMOGLOBIN: Hemoglobin: 8.5 g/dL — ABNORMAL LOW (ref 12.0–15.0)

## 2020-08-14 MED ORDER — POLYETHYLENE GLYCOL 3350 17 G PO PACK: 17.0000 g | PACK | Freq: Two times a day (BID) | ORAL | 0 refills | Status: AC

## 2020-08-14 MED ORDER — SODIUM CHLORIDE 0.9 % IV SOLN
400.0000 mg | Freq: Once | INTRAVENOUS | Status: AC
  Administered 2020-08-14: 400 mg via INTRAVENOUS
  Filled 2020-08-14: qty 20

## 2020-08-14 MED ORDER — POLYETHYLENE GLYCOL 3350 17 G PO PACK
17.0000 g | PACK | Freq: Once | ORAL | Status: AC
  Administered 2020-08-14: 09:00:00 17 g via ORAL
  Filled 2020-08-14: qty 1

## 2020-08-14 MED ORDER — PSYLLIUM 95 % PO PACK: 1.0000 | PACK | Freq: Every day | ORAL | 0 refills | Status: AC

## 2020-08-14 NOTE — Plan of Care (Signed)

## 2020-08-14 NOTE — Discharge Summary (Signed)
[facility] NAME: Rose Perry    MR#:  [id number]  DATE OF BIRTH:  [birth year]  DATE OF ADMISSION:  08/09/2020 ADMITTING PHYSICIAN: Collier Bullock, MD  DATE OF DISCHARGE: 08/14/2020  2:40 PM  PRIMARY CARE PHYSICIAN: Marinda Elk, MD    ADMISSION DIAGNOSIS:  Acute blood loss anemia [D62] Lower GI bleed [K92.2]  DISCHARGE DIAGNOSIS:  Principal Problem:   Acute blood loss anemia Active Problems:   Diabetes mellitus without complication (HCC)   Essential hypertension   Hypokalemia   Rectal bleed   Lower GI bleed   Abdominal aortic aneurysm (AAA) without rupture ([place])   SECONDARY DIAGNOSIS:   Past Medical History:  Diagnosis Date  . Arthritis   . Diabetes mellitus without complication ([place])   . Elevated lipids   . Hypertension     HOSPITAL COURSE:   1.  Acute blood loss anemia.  The patient received a total of 4 units of packed red blood cells and 2 infusions of IV Venofer.  Hemoglobin stable at 8.5.  Hemoglobin was as low as 6.1 during the hospital course.  The patient did not have a bowel movement after colonoscopy.  The patient states that she normally goes only twice a month.  MiraLAX and Metamucil prescribed.  Continue to hold aspirin.  Colonoscopy did show blood throughout the entire colon.  Likely diverticular bleed. 2.  Lower GI bleed likely diverticular in nature.  Colonoscopy did show blood throughout the entire colon.  Continue to hold aspirin. 3.  Dehydration.  Improved with IV fluids. 4.  Type 2 diabetes mellitus.  Hemoglobin A1c 6.5.  Can go back on Metformin as outpatient. 5.  Essential hypertension.  Blood pressure medication changed to metoprolol 6.  Nonsustained ventricular tachycardia from hypokalemia.  Continue potassium supplementation upon going home.  Discontinue hydrochlorothiazide on low-dose metoprolol. 7.  AAA measuring 4.5 x 4.9 cm will need outpatient vascular surgery follow-up.  Right  iliac artery aneurysm measuring 2.9 cm and will also have the follow-up with this with vascular surgery.  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  This is asymptomatic.   DISCHARGE CONDITIONS:   Satisfactory  CONSULTS OBTAINED:  Gastroenterology  DRUG ALLERGIES:   Allergies  Allergen Reactions  . Codeine     DISCHARGE MEDICATIONS:   Allergies as of 08/14/2020      Reactions   Codeine       Medication List    STOP taking these medications   amLODipine 5 MG tablet Commonly known as: NORVASC   aspirin EC 81 MG tablet   triamterene-hydrochlorothiazide 37.5-25 MG tablet Commonly known as: MAXZIDE-25     TAKE these medications   Lipitor 20 MG tablet Generic drug: atorvastatin Take 20 mg by mouth daily.   metFORMIN 500 MG tablet Commonly known as: GLUCOPHAGE Take 1 tablet by mouth 2 (two) times daily with a meal.   metoprolol tartrate 25 MG tablet Commonly known as: LOPRESSOR Take 0.5 tablets (12.5 mg total) by mouth 2 (two) times daily.   pantoprazole 40 MG tablet Commonly known as: Protonix Take 1 tablet (40 mg total) by mouth daily.   polyethylene glycol 17 g packet Commonly known as: MiraLax Take 17 g by mouth 2 (two) times daily.   potassium chloride 10 MEQ tablet Commonly known as: KLOR-CON Take 10 mEq by mouth daily.   psyllium 95 % Pack Commonly known as: HYDROCIL/METAMUCIL Take 1 packet by mouth daily.   timolol 0.5 % ophthalmic solution Commonly known  as: TIMOPTIC Place 1 drop into both eyes in the morning and at bedtime.        DISCHARGE INSTRUCTIONS:   Follow-up PMD 5 days Follow-up Dr. Haig Prophet gastroenterology in a few weeks Follow-up vascular surgery 1 month  If you experience worsening of your admission symptoms, develop shortness of breath, life threatening emergency, suicidal or homicidal thoughts you must seek medical attention immediately by calling 911 or calling your MD immediately  if symptoms less severe.  You Must read complete  instructions/literature along with all the possible adverse reactions/side effects for all the Medicines you take and that have been prescribed to you. Take any new Medicines after you have completely understood and accept all the possible adverse reactions/side effects.   Please note  You were cared for by a hospitalist during your hospital stay. If you have any questions about your discharge medications or the care you received while you were in the hospital after you are discharged, you can call the unit and asked to speak with the hospitalist on call if the hospitalist that took care of you is not available. Once you are discharged, your primary care physician will handle any further medical issues. Please note that NO REFILLS for any discharge medications will be authorized once you are discharged, as it is imperative that you return to your primary care physician (or establish a relationship with a primary care physician if you do not have one) for your aftercare needs so that they can reassess your need for medications and monitor your lab values.    Today   CHIEF COMPLAINT:   Chief Complaint  Patient presents with  . Rectal Bleeding    HISTORY OF PRESENT ILLNESS:  Rose Perry  is a 74 y.o. female came in initially with rectal bleeding   VITAL SIGNS:  Blood pressure (!) 148/85, pulse 69, temperature 97.8 F (36.6 C), resp. rate 16, height 5\' 7"  (1.702 m), weight 109.7 kg, SpO2 94 %.  I/O:    Intake/Output Summary (Last 24 hours) at 08/14/2020 1622 Last data filed at 08/14/2020 1350 Gross per 24 hour  Intake 720 ml  Output --  Net 720 ml    PHYSICAL EXAMINATION:  GENERAL:  74 y.o.-year-old patient lying in the bed with no acute distress.  EYES: Pupils equal, round, reactive to light and accommodation. No scleral icterus. HEENT: Head atraumatic, normocephalic. Oropharynx and nasopharynx clear.  LUNGS: Normal breath sounds bilaterally, no wheezing, rales,rhonchi or  crepitation. No use of accessory muscles of respiration.  CARDIOVASCULAR: S1, S2 normal. No murmurs, rubs, or gallops.  ABDOMEN: Soft, non-tender, non-distended.  EXTREMITIES: No pedal edema.  NEUROLOGIC: Cranial nerves II through XII are intact. Muscle strength 5/5 in all extremities. Sensation intact. Gait not checked.  PSYCHIATRIC: The patient is alert and oriented x 3.  SKIN: No obvious rash, lesion, or ulcer.   DATA REVIEW:   CBC Recent Labs  Lab 08/13/20 0527 08/13/20 0527 08/14/20 0718  WBC 9.1  --   --   HGB 8.4*   < > 8.5*  HCT 26.8*  --   --   PLT 205  --   --    < > = values in this interval not displayed.    Chemistries  Recent Labs  Lab 08/11/20 0540 08/11/20 0540 08/12/20 0619  NA 141   < > 143  K 3.0*   < > 3.5  CL 106   < > 111  CO2 27   < > 26  GLUCOSE 108*   < > 108*  BUN 15   < > 9  CREATININE 0.99   < > 1.03*  CALCIUM 7.8*   < > 7.7*  MG 1.9  --   --   AST 17   < > 17  ALT 10   < > 10  ALKPHOS 42   < > 40  BILITOT 0.5   < > 0.6   < > = values in this interval not displayed.    Microbiology Results  Results for orders placed or performed during the hospital encounter of 08/09/20  Resp Panel by RT-PCR (Flu A&B, Covid) Nasopharyngeal Swab     Status: None   Collection Time: 08/09/20 11:45 AM   Specimen: Nasopharyngeal Swab; Nasopharyngeal(NP) swabs in vial transport medium  Result Value Ref Range Status   SARS Coronavirus 2 by RT PCR NEGATIVE NEGATIVE Final    Comment: (NOTE) SARS-CoV-2 target nucleic acids are NOT DETECTED.  The SARS-CoV-2 RNA is generally detectable in upper respiratory specimens during the acute phase of infection. The lowest concentration of SARS-CoV-2 viral copies this assay can detect is 138 copies/mL. A negative result does not preclude SARS-Cov-2 infection and should not be used as the sole basis for treatment or other patient management decisions. A negative result may occur with  improper specimen  collection/handling, submission of specimen other than nasopharyngeal swab, presence of viral mutation(s) within the areas targeted by this assay, and inadequate number of viral copies(<138 copies/mL). A negative result must be combined with clinical observations, patient history, and epidemiological information. The expected result is Negative.  Fact Sheet for Patients:  EntrepreneurPulse.com.au  Fact Sheet for Healthcare Providers:  IncredibleEmployment.be  This test is no t yet approved or cleared by the [place] FDA and  has been authorized for detection and/or diagnosis of SARS-CoV-2 by FDA under an Emergency Use Authorization (EUA). This EUA will remain  in effect (meaning this test can be used) for the duration of the COVID-19 declaration under Section 564(b)(1) of the Act, 21 U.S.C.section 360bbb-3(b)(1), unless the authorization is terminated  or revoked sooner.       Influenza A by PCR NEGATIVE NEGATIVE Final   Influenza B by PCR NEGATIVE NEGATIVE Final    Comment: (NOTE) The Xpert Xpress SARS-CoV-2/FLU/RSV plus assay is intended as an aid in the diagnosis of influenza from Nasopharyngeal swab specimens and should not be used as a sole basis for treatment. Nasal washings and aspirates are unacceptable for Xpert Xpress SARS-CoV-2/FLU/RSV testing.  Fact Sheet for Patients: EntrepreneurPulse.com.au  Fact Sheet for Healthcare Providers: IncredibleEmployment.be  This test is not yet approved or cleared by the [place] FDA and has been authorized for detection and/or diagnosis of SARS-CoV-2 by FDA under an Emergency Use Authorization (EUA). This EUA will remain in effect (meaning this test can be used) for the duration of the COVID-19 declaration under Section 564(b)(1) of the Act, 21 U.S.C. section 360bbb-3(b)(1), unless the authorization is terminated or revoked.  Performed at [facility], [address] [place], [place] [address]      Management plans discussed with the patient, family and they are in agreement.  CODE STATUS:     Code Status Orders  (From admission, onward)         Start     Ordered   08/09/20 1817  Limited resuscitation (code)  Continuous       Comments: Patient does not want to be intubated or placed on mechanical ventilation. She  will like chest compressions  Question Answer Comment  In the event of cardiac or respiratory ARREST: Initiate Code Blue, Call Rapid Response Yes   In the event of cardiac or respiratory ARREST: Perform CPR Yes   In the event of cardiac or respiratory ARREST: Perform Intubation/Mechanical Ventilation No   In the event of cardiac or respiratory ARREST: Use NIPPV/BiPAp only if indicated Yes   In the event of cardiac or respiratory ARREST: Administer ACLS medications if indicated Yes   In the event of cardiac or respiratory ARREST: Perform Defibrillation or Cardioversion if indicated Yes   Comments CODE STATUS was discussed with patient and she requests to be placed on a DO NOT RESUSCITATE status      08/09/20 1818        Code Status History    Date Active Date Inactive Code Status Order ID Comments User Context   08/09/2020 1217 08/09/2020 1818 DNR [id number]  Collier Bullock, MD ED   08/09/2020 1204 08/09/2020 1217 Full Code [id number]  Collier Bullock, MD ED   Advance Care Planning Activity      TOTAL TIME TAKING CARE OF THIS PATIENT: 35 minutes.    Loletha Grayer M.D on 08/14/2020 at 4:22 PM  Between 7am to 6pm - Pager - [phone]  After 6pm go to [email or web address] - password [email or web address]  [facility]  CC: Primary care physician; Marinda Elk, MD

## 2020-08-19 DIAGNOSIS — I10 Essential (primary) hypertension: Secondary | ICD-10-CM | POA: Diagnosis not present

## 2020-08-19 DIAGNOSIS — K922 Gastrointestinal hemorrhage, unspecified: Secondary | ICD-10-CM | POA: Diagnosis not present

## 2020-08-19 DIAGNOSIS — I714 Abdominal aortic aneurysm, without rupture: Secondary | ICD-10-CM | POA: Diagnosis not present

## 2020-08-19 DIAGNOSIS — K219 Gastro-esophageal reflux disease without esophagitis: Secondary | ICD-10-CM | POA: Diagnosis not present

## 2020-08-19 DIAGNOSIS — Z09 Encounter for follow-up examination after completed treatment for conditions other than malignant neoplasm: Secondary | ICD-10-CM | POA: Diagnosis not present

## 2020-08-19 DIAGNOSIS — I808 Phlebitis and thrombophlebitis of other sites: Secondary | ICD-10-CM | POA: Diagnosis not present

## 2020-08-24 DIAGNOSIS — H35351 Cystoid macular degeneration, right eye: Secondary | ICD-10-CM | POA: Diagnosis not present

## 2020-08-24 DIAGNOSIS — H35051 Retinal neovascularization, unspecified, right eye: Secondary | ICD-10-CM | POA: Diagnosis not present

## 2020-09-15 ENCOUNTER — Other Ambulatory Visit (INDEPENDENT_AMBULATORY_CARE_PROVIDER_SITE_OTHER): Payer: Self-pay

## 2020-10-09 DIAGNOSIS — E113292 Type 2 diabetes mellitus with mild nonproliferative diabetic retinopathy without macular edema, left eye: Secondary | ICD-10-CM | POA: Diagnosis not present

## 2020-10-09 DIAGNOSIS — H35351 Cystoid macular degeneration, right eye: Secondary | ICD-10-CM | POA: Diagnosis not present

## 2020-10-09 DIAGNOSIS — H35051 Retinal neovascularization, unspecified, right eye: Secondary | ICD-10-CM | POA: Diagnosis not present

## 2020-10-23 DIAGNOSIS — H35351 Cystoid macular degeneration, right eye: Secondary | ICD-10-CM | POA: Diagnosis not present

## 2020-11-02 DIAGNOSIS — I10 Essential (primary) hypertension: Secondary | ICD-10-CM | POA: Diagnosis not present

## 2020-11-02 DIAGNOSIS — E538 Deficiency of other specified B group vitamins: Secondary | ICD-10-CM | POA: Diagnosis not present

## 2020-11-02 DIAGNOSIS — E782 Mixed hyperlipidemia: Secondary | ICD-10-CM | POA: Diagnosis not present

## 2020-11-02 DIAGNOSIS — G629 Polyneuropathy, unspecified: Secondary | ICD-10-CM | POA: Diagnosis not present

## 2020-11-02 DIAGNOSIS — E119 Type 2 diabetes mellitus without complications: Secondary | ICD-10-CM | POA: Diagnosis not present

## 2020-11-09 DIAGNOSIS — E119 Type 2 diabetes mellitus without complications: Secondary | ICD-10-CM | POA: Diagnosis not present

## 2020-11-09 DIAGNOSIS — I10 Essential (primary) hypertension: Secondary | ICD-10-CM | POA: Diagnosis not present

## 2020-11-09 DIAGNOSIS — Z0000 Encounter for general adult medical examination without abnormal findings: Secondary | ICD-10-CM | POA: Diagnosis not present

## 2020-11-09 DIAGNOSIS — I723 Aneurysm of iliac artery: Secondary | ICD-10-CM | POA: Diagnosis not present

## 2020-11-09 DIAGNOSIS — I714 Abdominal aortic aneurysm, without rupture: Secondary | ICD-10-CM | POA: Diagnosis not present

## 2020-11-09 DIAGNOSIS — R600 Localized edema: Secondary | ICD-10-CM | POA: Diagnosis not present

## 2020-11-09 DIAGNOSIS — I774 Celiac artery compression syndrome: Secondary | ICD-10-CM | POA: Diagnosis not present

## 2020-11-09 DIAGNOSIS — E782 Mixed hyperlipidemia: Secondary | ICD-10-CM | POA: Diagnosis not present

## 2020-11-20 DIAGNOSIS — H35051 Retinal neovascularization, unspecified, right eye: Secondary | ICD-10-CM | POA: Diagnosis not present

## 2020-11-20 DIAGNOSIS — H35351 Cystoid macular degeneration, right eye: Secondary | ICD-10-CM | POA: Diagnosis not present

## 2020-11-22 DIAGNOSIS — E785 Hyperlipidemia, unspecified: Secondary | ICD-10-CM | POA: Insufficient documentation

## 2020-11-22 DIAGNOSIS — I774 Celiac artery compression syndrome: Secondary | ICD-10-CM | POA: Insufficient documentation

## 2020-11-22 DIAGNOSIS — I771 Stricture of artery: Secondary | ICD-10-CM | POA: Insufficient documentation

## 2020-11-22 DIAGNOSIS — I890 Lymphedema, not elsewhere classified: Secondary | ICD-10-CM | POA: Insufficient documentation

## 2020-11-22 DIAGNOSIS — I872 Venous insufficiency (chronic) (peripheral): Secondary | ICD-10-CM | POA: Insufficient documentation

## 2020-11-22 NOTE — Progress Notes (Signed)
MRN : [id number]  Rose Perry is a 75 y.o. (Nov 22, 1945) female who presents with chief complaint of No chief complaint on file. Marland Kitchen  History of Present Illness:   The patient presents to the office for evaluation of an abdominal aortic aneurysm. The aneurysm was found incidentally by CT scan done for GI bleed. Patient denies abdominal pain or unusual back pain, no other abdominal complaints.  No history of an acute onset of painful blue discoloration of the toes.     No family history of AAA.   Patient denies amaurosis fugax or TIA symptoms. There is no history of claudication or rest pain symptoms of the lower extremities.  The patient denies angina or shortness of breath.  CT scan shows an AAA that measures 4.9 cm with a right common iliac artery aneurysm of 2.9 cm  No outpatient medications have been marked as taking for the 11/23/20 encounter (Appointment) with Delana Meyer, Dolores Lory, MD.    Past Medical History:  Diagnosis Date  . Arthritis   . Diabetes mellitus without complication ([place])   . Elevated lipids   . Hypertension     Past Surgical History:  Procedure Laterality Date  . ABDOMINAL HYSTERECTOMY    . COLONOSCOPY    . COLONOSCOPY WITH PROPOFOL N/A 08/22/2016   Procedure: COLONOSCOPY WITH PROPOFOL;  Surgeon: Manya Silvas, MD;  Location: [facility] ENDOSCOPY;  Service: Endoscopy;  Laterality: N/A;  . COLONOSCOPY WITH PROPOFOL N/A 08/11/2020   Procedure: COLONOSCOPY WITH PROPOFOL;  Surgeon: Lesly Rubenstein, MD;  Location: [facility] ENDOSCOPY;  Service: Endoscopy;  Laterality: N/A;  . ESOPHAGOGASTRODUODENOSCOPY (EGD) WITH PROPOFOL N/A 08/11/2020   Procedure: ESOPHAGOGASTRODUODENOSCOPY (EGD) WITH PROPOFOL;  Surgeon: Lesly Rubenstein, MD;  Location: [facility] ENDOSCOPY;  Service: Endoscopy;  Laterality: N/A;  . ltkr    . needle in foot removed    . REFRACTIVE SURGERY      Social History Social History   Tobacco Use  . Smoking status: Former Research scientist (life sciences)  . Smokeless tobacco:  Never Used  Substance Use Topics  . Alcohol use: No  . Drug use: No    Family History Family History  Problem Relation Age of Onset  . Breast cancer Neg Hx     Allergies  Allergen Reactions  . Codeine      REVIEW OF SYSTEMS (Negative unless checked)  Constitutional: [] Weight loss  [] Fever  [] Chills Cardiac: [] Chest pain   [] Chest pressure   [] Palpitations   [] Shortness of breath when laying flat   [] Shortness of breath with exertion. Vascular:  [] Pain in legs with walking   [] Pain in legs at rest  [] History of DVT   [] Phlebitis   [] Swelling in legs   [] Varicose veins   [] Non-healing ulcers Pulmonary:   [] Uses home oxygen   [] Productive cough   [] Hemoptysis   [] Wheeze  [] COPD   [] Asthma Neurologic:  [] Dizziness   [] Seizures   [] History of stroke   [] History of TIA  [] Aphasia   [] Vissual changes   [] Weakness or numbness in arm   [] Weakness or numbness in leg Musculoskeletal:   [] Joint swelling   [] Joint pain   [] Low back pain Hematologic:  [] Easy bruising  [] Easy bleeding   [] Hypercoagulable state   [] Anemic Gastrointestinal:  [] Diarrhea   [] Vomiting  [] Gastroesophageal reflux/heartburn   [] Difficulty swallowing. Genitourinary:  [] Chronic kidney disease   [] Difficult urination  [] Frequent urination   [] Blood in urine Skin:  [] Rashes   [] Ulcers  Psychological:  [] History of anxiety   []   History of major depression.  Physical Examination  There were no vitals filed for this visit. There is no height or weight on file to calculate BMI. Gen: WD/WN, NAD Head: [place]/AT, No temporalis wasting.  Ear/Nose/Throat: Hearing grossly intact, nares w/o erythema or drainage Eyes: PER, EOMI, sclera nonicteric.  Neck: Supple, no large masses.   Pulmonary:  Good air movement, no audible wheezing bilaterally, no use of accessory muscles.  Cardiac: RRR, no JVD Vascular: scattered varicosities present bilaterally.  Mild venous stasis changes to the legs bilaterally.  2+ soft pitting edema Vessel  Right Left  Radial Palpable Palpable  Carotid Palpable Palpable  Popliteal Palpable Palpable  PT Palpable Palpable  DP Palpable Palpable  Gastrointestinal: Non-distended. No guarding/no peritoneal signs.  Musculoskeletal: M/S 5/5 throughout.  No deformity or atrophy.  Neurologic: CN 2-12 intact. Symmetrical.  Speech is fluent. Motor exam as listed above. Psychiatric: Judgment intact, Mood & affect appropriate for pt's clinical situation. Dermatologic: No rashes or ulcers noted.  No changes consistent with cellulitis. Lymph : No lichenification or skin changes of chronic lymphedema.  CBC Lab Results  Component Value Date   WBC 9.1 08/13/2020   HGB 8.5 (L) 08/14/2020   HCT 26.8 (L) 08/13/2020   MCV 87.3 08/13/2020   PLT 205 08/13/2020    BMET    Component Value Date/Time   NA 143 08/12/2020 0619   NA 137 08/23/2013 0519   K 3.5 08/12/2020 0619   K 3.4 (L) 08/23/2013 0519   CL 111 08/12/2020 0619   CL 103 08/23/2013 0519   CO2 26 08/12/2020 0619   CO2 30 08/23/2013 0519   GLUCOSE 108 (H) 08/12/2020 0619   GLUCOSE 168 (H) 08/23/2013 0519   BUN 9 08/12/2020 0619   BUN 12 08/23/2013 0519   CREATININE 1.03 (H) 08/12/2020 0619   CREATININE 1.17 08/23/2013 0519   CALCIUM 7.7 (L) 08/12/2020 0619   CALCIUM 8.2 (L) 08/23/2013 0519   GFRNONAA 57 (L) 08/12/2020 0619   GFRNONAA 48 (L) 08/23/2013 0519   GFRAA 55 (L) 08/23/2013 0519   CrCl cannot be calculated (Patient's most recent lab result is older than the maximum 21 days allowed.).  COAG Lab Results  Component Value Date   INR 1.0 08/09/2020   INR 1.0 08/07/2013    Radiology No results found.   Assessment/Plan 1. Abdominal aortic aneurysm (AAA) without rupture ([place]) No surgery or intervention at this time. The patient and I had a long discussion regarding repair based on the size of her abdominal aortic aneurysm being essentially at 5 cm but in association with the iliac artery aneurysm being approximately 3 cm in  size.  I discussed endovascular repair and reviewed the CT scan with her.  At this point in time she would like to discuss this with her granddaughter she did not agree to proceed and I did get her to commit to a 31-month follow-up which she was reluctant to do.  The patient has an asymptomatic abdominal aortic aneurysm that is greater than 4 cm but less than 5 cm in maximal diameter.  I have discussed the natural history of abdominal aortic aneurysm and the small risk of rupture for aneurysm less than 5 cm in size.  However, as these small aneurysms tend to enlarge over time, continued surveillance with ultrasound or CT scan is mandatory.   I have also discussed optimizing medical management with hypertension and lipid control and the importance of abstinence from tobacco.  The patient is also encouraged to  exercise a minimum of 30 minutes 4 times a week.   Should the patient develop new onset abdominal or back pain or signs of peripheral embolization they are instructed to seek medical attention immediately and to alert the physician providing care that they have an aneurysm.   The patient voices their understanding. I have scheduled the patient to return in 6 months with an aortic duplex.  - VAS US AORTA/IVC/ILIACS; Future  2. Celiac artery stenosis (HCC) Given her widely patent SMA and widely patent IMA repair of her celiac artery stenosis which is asymptomatic is not indicated at this time.  However, abdominal aortic aneurysm repair has an obligatory occlusion of the IMA and therefore I would plan for celiac stenting prior to the endograft procedure this was also discussed with the patient  3. Chronic venous insufficiency No surgery or intervention at this point in time.  I have reviewed my discussion with the patient regarding venous insufficiency and why it causes symptoms. I have discussed with the patient the chronic skin changes that accompany venous insufficiency and the long term  sequela such as ulceration. Patient will contnue wearing graduated compression stockings on a daily basis, as this has provided excellent control of his edema. The patient will put the stockings on first thing in the morning and removing them in the evening. The patient is reminded not to sleep in the stockings.  In addition, behavioral modification including elevation during the day will be initiated. Exercise is strongly encouraged.  4. Lymphedema No surgery or intervention at this point in time.  I have reviewed my discussion with the patient regarding venous insufficiency and why it causes symptoms. I have discussed with the patient the chronic skin changes that accompany venous insufficiency and the long term sequela such as ulceration. Patient will contnue wearing graduated compression stockings on a daily basis, as this has provided excellent control of his edema. The patient will put the stockings on first thing in the morning and removing them in the evening. The patient is reminded not to sleep in the stockings.  In addition, behavioral modification including elevation during the day will be initiated. Exercise is strongly encouraged.  5. Diabetes mellitus without complication ([place]) Continue hypoglycemic medications as already ordered, these medications have been reviewed and there are no changes at this time.  Hgb A1C to be monitored as already arranged by primary service   6. Mixed hyperlipidemia Continue statin as ordered and reviewed, no changes at this time     Hortencia Pilar, MD  11/22/2020 12:55 PM

## 2020-11-23 ENCOUNTER — Ambulatory Visit (INDEPENDENT_AMBULATORY_CARE_PROVIDER_SITE_OTHER): Payer: Medicare HMO | Admitting: Vascular Surgery

## 2020-11-23 ENCOUNTER — Other Ambulatory Visit: Payer: Self-pay

## 2020-11-23 ENCOUNTER — Encounter (INDEPENDENT_AMBULATORY_CARE_PROVIDER_SITE_OTHER): Payer: Self-pay | Admitting: Vascular Surgery

## 2020-11-23 VITALS — BP 136/81 | HR 95 | Resp 16 | Ht 67.0 in | Wt 224.0 lb

## 2020-11-23 DIAGNOSIS — I890 Lymphedema, not elsewhere classified: Secondary | ICD-10-CM | POA: Diagnosis not present

## 2020-11-23 DIAGNOSIS — E782 Mixed hyperlipidemia: Secondary | ICD-10-CM | POA: Diagnosis not present

## 2020-11-23 DIAGNOSIS — E119 Type 2 diabetes mellitus without complications: Secondary | ICD-10-CM | POA: Diagnosis not present

## 2020-11-23 DIAGNOSIS — I774 Celiac artery compression syndrome: Secondary | ICD-10-CM | POA: Diagnosis not present

## 2020-11-23 DIAGNOSIS — I872 Venous insufficiency (chronic) (peripheral): Secondary | ICD-10-CM | POA: Diagnosis not present

## 2020-11-23 DIAGNOSIS — I714 Abdominal aortic aneurysm, without rupture, unspecified: Secondary | ICD-10-CM

## 2020-11-23 DIAGNOSIS — I771 Stricture of artery: Secondary | ICD-10-CM

## 2020-11-23 DIAGNOSIS — M1990 Unspecified osteoarthritis, unspecified site: Secondary | ICD-10-CM | POA: Insufficient documentation

## 2020-11-27 ENCOUNTER — Encounter (INDEPENDENT_AMBULATORY_CARE_PROVIDER_SITE_OTHER): Payer: Self-pay | Admitting: Vascular Surgery

## 2020-12-25 DIAGNOSIS — H35351 Cystoid macular degeneration, right eye: Secondary | ICD-10-CM | POA: Diagnosis not present

## 2021-02-11 DIAGNOSIS — H35051 Retinal neovascularization, unspecified, right eye: Secondary | ICD-10-CM | POA: Diagnosis not present

## 2021-02-11 DIAGNOSIS — E113292 Type 2 diabetes mellitus with mild nonproliferative diabetic retinopathy without macular edema, left eye: Secondary | ICD-10-CM | POA: Diagnosis not present

## 2021-02-11 DIAGNOSIS — H35033 Hypertensive retinopathy, bilateral: Secondary | ICD-10-CM | POA: Diagnosis not present

## 2021-02-11 DIAGNOSIS — H35351 Cystoid macular degeneration, right eye: Secondary | ICD-10-CM | POA: Diagnosis not present

## 2021-02-18 DIAGNOSIS — H35051 Retinal neovascularization, unspecified, right eye: Secondary | ICD-10-CM | POA: Diagnosis not present

## 2021-03-25 ENCOUNTER — Other Ambulatory Visit: Payer: Self-pay | Admitting: Physician Assistant

## 2021-03-25 ENCOUNTER — Other Ambulatory Visit: Payer: Self-pay

## 2021-03-25 ENCOUNTER — Ambulatory Visit
Admission: RE | Admit: 2021-03-25 | Discharge: 2021-03-25 | Disposition: A | Discharge status: Home / Self Care | Payer: Medicare HMO | Source: Ambulatory Visit | Attending: Physician Assistant | Admitting: Physician Assistant

## 2021-03-25 DIAGNOSIS — M7989 Other specified soft tissue disorders: Secondary | ICD-10-CM | POA: Insufficient documentation

## 2021-03-25 DIAGNOSIS — R600 Localized edema: Secondary | ICD-10-CM | POA: Diagnosis not present

## 2021-04-22 DIAGNOSIS — H35351 Cystoid macular degeneration, right eye: Secondary | ICD-10-CM | POA: Diagnosis not present

## 2021-05-04 ENCOUNTER — Other Ambulatory Visit: Payer: Self-pay | Admitting: Physician Assistant

## 2021-05-04 DIAGNOSIS — Z1231 Encounter for screening mammogram for malignant neoplasm of breast: Secondary | ICD-10-CM

## 2021-05-11 DIAGNOSIS — E782 Mixed hyperlipidemia: Secondary | ICD-10-CM | POA: Diagnosis not present

## 2021-05-11 DIAGNOSIS — I10 Essential (primary) hypertension: Secondary | ICD-10-CM | POA: Diagnosis not present

## 2021-05-11 DIAGNOSIS — E119 Type 2 diabetes mellitus without complications: Secondary | ICD-10-CM | POA: Diagnosis not present

## 2021-05-12 DIAGNOSIS — R8290 Unspecified abnormal findings in urine: Secondary | ICD-10-CM | POA: Diagnosis not present

## 2021-05-24 ENCOUNTER — Encounter (INDEPENDENT_AMBULATORY_CARE_PROVIDER_SITE_OTHER): Payer: Self-pay | Admitting: Vascular Surgery

## 2021-05-24 ENCOUNTER — Ambulatory Visit (INDEPENDENT_AMBULATORY_CARE_PROVIDER_SITE_OTHER): Payer: Medicare HMO | Admitting: Vascular Surgery

## 2021-05-24 ENCOUNTER — Other Ambulatory Visit: Payer: Self-pay

## 2021-05-24 ENCOUNTER — Ambulatory Visit (INDEPENDENT_AMBULATORY_CARE_PROVIDER_SITE_OTHER): Payer: Medicare HMO

## 2021-05-24 VITALS — BP 161/77 | HR 60 | Ht 67.0 in | Wt 224.0 lb

## 2021-05-24 DIAGNOSIS — E782 Mixed hyperlipidemia: Secondary | ICD-10-CM | POA: Diagnosis not present

## 2021-05-24 DIAGNOSIS — I10 Essential (primary) hypertension: Secondary | ICD-10-CM | POA: Diagnosis not present

## 2021-05-24 DIAGNOSIS — I872 Venous insufficiency (chronic) (peripheral): Secondary | ICD-10-CM | POA: Diagnosis not present

## 2021-05-24 DIAGNOSIS — I714 Abdominal aortic aneurysm, without rupture, unspecified: Secondary | ICD-10-CM

## 2021-05-24 DIAGNOSIS — E113292 Type 2 diabetes mellitus with mild nonproliferative diabetic retinopathy without macular edema, left eye: Secondary | ICD-10-CM | POA: Diagnosis not present

## 2021-05-24 DIAGNOSIS — M79604 Pain in right leg: Secondary | ICD-10-CM | POA: Diagnosis not present

## 2021-05-24 DIAGNOSIS — I774 Celiac artery compression syndrome: Secondary | ICD-10-CM

## 2021-05-24 DIAGNOSIS — I771 Stricture of artery: Secondary | ICD-10-CM

## 2021-05-24 NOTE — Progress Notes (Signed)
MRN : [id number]  Rose Perry is a 75 y.o. (December 31, 1945) female who presents with chief complaint of follow up for aneurysm.  History of Present Illness:   The patient presents to the office for evaluation of an abdominal aortic aneurysm. The aneurysm was found incidentally by CT scan done for GI bleed. Patient denies abdominal pain or unusual back pain, no other abdominal complaints.  No history of an acute onset of painful blue discoloration of the toes.  However, today she is noting that she is having excruciating pain of her right lower extremity.  She said the weekend was miserable and if she had a chainsaw she would have cut her own leg off.  Pain is occurring at rest as well as with walking and with sitting.  She notes that it is in the back as well as her hip but appears to radiate down her whole leg.  There is no documented past history of claudication or rest pain symptoms of the lower extremities.   No family history of AAA.    Patient denies amaurosis fugax or TIA symptoms.    The patient denies angina or shortness of breath.   CT scan from 03/25/2021 showed an AAA that measures 4.9 cm with a right common iliac artery aneurysm of 2.9 cm  Duplex ultrasound today shows the aneurysm is 4.3 cm in maximal diameter with the right common iliac measuring 1.5 cm and the left common iliac measuring 1.2 cm.  Current Meds  Medication Sig   amLODipine (NORVASC) 5 MG tablet Take 1 tablet by mouth daily.   atorvastatin (LIPITOR) 20 MG tablet Take 20 mg by mouth daily.   atorvastatin (LIPITOR) 20 MG tablet Take 1 tablet by mouth daily.   cyanocobalamin 1000 MCG tablet Take by mouth.   furosemide (LASIX) 20 MG tablet Take by mouth.   metFORMIN (GLUCOPHAGE) 500 MG tablet Take 1 tablet by mouth 2 (two) times daily with a meal.   metoprolol tartrate (LOPRESSOR) 25 MG tablet Take 0.5 tablets (12.5 mg total) by mouth 2 (two) times daily.   polyethylene glycol (MIRALAX) 17 g packet Take 17 g  by mouth 2 (two) times daily.   polyethylene glycol powder (GLYCOLAX/MIRALAX) 17 GM/SCOOP powder Take by mouth.   potassium chloride (KLOR-CON) 10 MEQ tablet Take 10 mEq by mouth daily.    Past Medical History:  Diagnosis Date   Arthritis    Diabetes mellitus without complication ([place])    Elevated lipids    Hypertension     Past Surgical History:  Procedure Laterality Date   ABDOMINAL HYSTERECTOMY     COLONOSCOPY     COLONOSCOPY WITH PROPOFOL N/A 08/22/2016   Procedure: COLONOSCOPY WITH PROPOFOL;  Surgeon: Manya Silvas, MD;  Location: [facility];  Service: Endoscopy;  Laterality: N/A;   COLONOSCOPY WITH PROPOFOL N/A 08/11/2020   Procedure: COLONOSCOPY WITH PROPOFOL;  Surgeon: Lesly Rubenstein, MD;  Location: [facility] ENDOSCOPY;  Service: Endoscopy;  Laterality: N/A;   ESOPHAGOGASTRODUODENOSCOPY (EGD) WITH PROPOFOL N/A 08/11/2020   Procedure: ESOPHAGOGASTRODUODENOSCOPY (EGD) WITH PROPOFOL;  Surgeon: Lesly Rubenstein, MD;  Location: [facility] ENDOSCOPY;  Service: Endoscopy;  Laterality: N/A;   ltkr     needle in foot removed     REFRACTIVE SURGERY      Social History Social History   Tobacco Use   Smoking status: Former   Smokeless tobacco: Never  Substance Use Topics   Alcohol use: No   Drug use: No    Family History  Family History  Problem Relation Age of Onset   Hypertension Mother    Hypertension Father    Breast cancer Neg Hx     Allergies  Allergen Reactions   Codeine      REVIEW OF SYSTEMS (Negative unless checked)  Constitutional: '[]'$ Weight loss  '[]'$ Fever  '[]'$ Chills Cardiac: '[]'$ Chest pain   '[]'$ Chest pressure   '[]'$ Palpitations   '[]'$ Shortness of breath when laying flat   '[]'$ Shortness of breath with exertion. Vascular:  '[x]'$ Pain in legs with walking   '[x]'$ Pain in legs at rest  '[]'$ History of DVT   '[]'$ Phlebitis   '[x]'$ Swelling in legs   '[]'$ Varicose veins   '[]'$ Non-healing ulcers Pulmonary:   '[]'$ Uses home oxygen   '[]'$ Productive cough   '[]'$ Hemoptysis   '[]'$ Wheeze  '[]'$ COPD    '[]'$ Asthma Neurologic:  '[]'$ Dizziness   '[]'$ Seizures   '[]'$ History of stroke   '[]'$ History of TIA  '[]'$ Aphasia   '[]'$ Vissual changes   '[]'$ Weakness or numbness in arm   '[]'$ Weakness or numbness in leg Musculoskeletal:   '[]'$ Joint swelling   '[x]'$ Joint pain   '[x]'$ Low back pain Hematologic:  '[]'$ Easy bruising  '[]'$ Easy bleeding   '[]'$ Hypercoagulable state   '[]'$ Anemic Gastrointestinal:  '[]'$ Diarrhea   '[]'$ Vomiting  '[]'$ Gastroesophageal reflux/heartburn   '[]'$ Difficulty swallowing. Genitourinary:  '[]'$ Chronic kidney disease   '[]'$ Difficult urination  '[]'$ Frequent urination   '[]'$ Blood in urine Skin:  '[]'$ Rashes   '[]'$ Ulcers  Psychological:  '[]'$ History of anxiety   '[]'$  History of major depression.  Physical Examination  Vitals:   05/24/21 1012  BP: (!) 161/77  Pulse: 60  Weight: 224 lb (101.6 kg)  Height: '5\' 7"'$  (1.702 m)   Body mass index is 35.08 kg/m. Gen: WD/WN, NAD Head: [place]/AT, No temporalis wasting.  Ear/Nose/Throat: Hearing grossly intact, nares w/o erythema or drainage Eyes: PER, EOMI, sclera nonicteric.  Neck: Supple, no masses.  No bruit or JVD.  Pulmonary:  Good air movement, no audible wheezing, no use of accessory muscles.  Cardiac: RRR, normal S1, S2, no Murmurs. Vascular:  scattered varicosities present bilaterally.  Mild venous stasis changes to the legs bilaterally.  2-3+ soft pitting edema  Vessel Right Left  Radial Palpable Palpable  Carotid Palpable Palpable  PT Not Palpable Not Palpable  DP Not Palpable Not Palpable  Gastrointestinal: soft, non-distended. No guarding/no peritoneal signs.  Musculoskeletal: M/S 5/5 throughout.  No visible deformity.  Neurologic: CN 2-12 intact. Pain and light touch intact in extremities.  Symmetrical.  Speech is fluent. Motor exam as listed above. Psychiatric: Judgment intact, Mood & affect appropriate for pt's clinical situation. Dermatologic: Venous rashes no ulcers noted.  No changes consistent with cellulitis.   CBC Lab Results  Component Value Date   WBC 9.1 08/13/2020    HGB 8.5 (L) 08/14/2020   HCT 26.8 (L) 08/13/2020   MCV 87.3 08/13/2020   PLT 205 08/13/2020    BMET    Component Value Date/Time   NA 143 08/12/2020 0619   NA 137 08/23/2013 0519   K 3.5 08/12/2020 0619   K 3.4 (L) 08/23/2013 0519   CL 111 08/12/2020 0619   CL 103 08/23/2013 0519   CO2 26 08/12/2020 0619   CO2 30 08/23/2013 0519   GLUCOSE 108 (H) 08/12/2020 0619   GLUCOSE 168 (H) 08/23/2013 0519   BUN 9 08/12/2020 0619   BUN 12 08/23/2013 0519   CREATININE 1.03 (H) 08/12/2020 0619   CREATININE 1.17 08/23/2013 0519   CALCIUM 7.7 (L) 08/12/2020 0619   CALCIUM 8.2 (L) 08/23/2013 0519   GFRNONAA 57 (L)  08/12/2020 0619   GFRNONAA 48 (L) 08/23/2013 0519   GFRAA 55 (L) 08/23/2013 0519   CrCl cannot be calculated (Patient's most recent lab result is older than the maximum 21 days allowed.).  COAG Lab Results  Component Value Date   INR 1.0 08/09/2020   INR 1.0 08/07/2013    Radiology No results found.   Assessment/Plan 1. Abdominal aortic aneurysm (AAA) without rupture ([place]) No surgery or intervention at this time.  The patient has an asymptomatic abdominal aortic aneurysm that is greater than 4 cm but less than 5 cm in maximal diameter.  I have discussed the natural history of abdominal aortic aneurysm and the small risk of rupture for aneurysm less than 5 cm in size.  However, as these small aneurysms tend to enlarge over time, continued surveillance with ultrasound or CT scan is mandatory.   I have also discussed optimizing medical management with hypertension and lipid control and the importance of abstinence from tobacco.  The patient is also encouraged to try to exercise a minimum of 30 minutes 4 times a week.   Should the patient develop new onset abdominal or back pain or signs of peripheral embolization they are instructed to seek medical attention immediately and to alert the physician providing care that they have an aneurysm.  The patient voices their  understanding.  I have scheduled the patient to return in 6 months with an aortic duplex.   2. Leg pain, diffuse, right  Recommend:  The patient has atypical pain symptoms for pure atherosclerotic disease. However, on physical exam there is evidence of mixed venous and arterial disease, given the diminished pulses and the edema associated with venous changes of the legs.  Noninvasive studies including ABI's and venous ultrasound of the legs will be obtained and the patient will follow up with me to review these studies.  I suspect the patient is c/o pseudoclaudication.  Patient should have an evaluation of his LS spine which I defer to the primary service.  The patient should continue walking and begin a more formal exercise program. The patient should continue his antiplatelet therapy and aggressive treatment of the lipid abnormalities.  The patient should begin wearing graduated compression socks 15-20 mmHg strength to control edema.    NB: Given the patient's multiple complex medical conditions and in particular her severe leg pain which is made profoundly worse with sitting for periods of time greater than 20 to 30 minutes she should be excused from jury duty.  I have further investigating this process and whether it is in part due to either arterial and/or venous insufficiency but there is also clearly a component of degenerative lumbar sacral spine disease as well as degenerative hip disease and these are a permanent problem unlikely to ever improve significantly.  Based on this I feel very comfortable recommending that she be excused from jury duty permanently.  - VAS [place] ABI WITH/WO TBI; Future - VAS [place] LOWER EXTREMITY VENOUS REFLUX; Future  3. Celiac artery stenosis (HCC) Given her widely patent SMA and widely patent IMA repair of her celiac artery stenosis which is asymptomatic is not indicated at this time.  However, abdominal aortic aneurysm repair has an obligatory occlusion of  the IMA and therefore I would plan for celiac stenting prior to the endograft procedure this was also discussed with the patient  4. Chronic venous insufficiency No surgery or intervention at this point in time.   I have reviewed my discussion with the patient regarding venous  insufficiency and why it causes symptoms. I have discussed with the patient the chronic skin changes that accompany venous insufficiency and the long term sequela such as ulceration. Patient will contnue wearing graduated compression stockings on a daily basis, as this has provided excellent control of his edema. The patient will put the stockings on first thing in the morning and removing them in the evening. The patient is reminded not to sleep in the stockings.   In addition, behavioral modification including elevation during the day will be initiated.  Exercise is strongly encouraged.  5. Primary hypertension Continue antihypertensive medications as already ordered, these medications have been reviewed and there are no changes at this time.   6. 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  Hgb A1C to be monitored as already arranged by primary service   7. Mixed hyperlipidemia Continue statin as ordered and reviewed, no changes at this time    Hortencia Pilar, MD  05/24/2021 10:27 AM

## 2021-05-28 ENCOUNTER — Other Ambulatory Visit: Payer: Self-pay

## 2021-05-28 ENCOUNTER — Ambulatory Visit
Admission: RE | Admit: 2021-05-28 | Discharge: 2021-05-28 | Disposition: A | Discharge status: Home / Self Care | Payer: Medicare HMO | Source: Ambulatory Visit | Attending: Physician Assistant | Admitting: Physician Assistant

## 2021-05-28 DIAGNOSIS — Z1231 Encounter for screening mammogram for malignant neoplasm of breast: Secondary | ICD-10-CM | POA: Insufficient documentation

## 2021-06-09 ENCOUNTER — Ambulatory Visit (INDEPENDENT_AMBULATORY_CARE_PROVIDER_SITE_OTHER): Payer: Medicare HMO

## 2021-06-09 ENCOUNTER — Encounter (INDEPENDENT_AMBULATORY_CARE_PROVIDER_SITE_OTHER): Payer: Self-pay | Admitting: Nurse Practitioner

## 2021-06-09 ENCOUNTER — Ambulatory Visit (INDEPENDENT_AMBULATORY_CARE_PROVIDER_SITE_OTHER): Payer: Medicare HMO | Admitting: Nurse Practitioner

## 2021-06-09 ENCOUNTER — Other Ambulatory Visit: Payer: Self-pay

## 2021-06-09 VITALS — BP 176/84 | HR 84 | Ht 69.0 in | Wt 222.0 lb

## 2021-06-09 DIAGNOSIS — M79604 Pain in right leg: Secondary | ICD-10-CM

## 2021-06-09 DIAGNOSIS — I10 Essential (primary) hypertension: Secondary | ICD-10-CM

## 2021-06-09 DIAGNOSIS — I7143 Infrarenal abdominal aortic aneurysm, without rupture: Secondary | ICD-10-CM

## 2021-06-09 DIAGNOSIS — E113292 Type 2 diabetes mellitus with mild nonproliferative diabetic retinopathy without macular edema, left eye: Secondary | ICD-10-CM | POA: Diagnosis not present

## 2021-06-20 ENCOUNTER — Encounter (INDEPENDENT_AMBULATORY_CARE_PROVIDER_SITE_OTHER): Payer: Self-pay | Admitting: Nurse Practitioner

## 2021-06-20 NOTE — Progress Notes (Signed)
Subjective:    Patient ID: Rose Perry, female    DOB: Apr 29, 1946, 75 y.o.   MRN: [id number] Chief Complaint  Patient presents with   Follow-up    Pt conv [place] RLE  reflux     The patient is seen for evaluation of painful lower extremities. Patient notes the pain is variable and not always associated with activity.  The pain is somewhat consistent day to day occurring on most days. The patient notes the pain also occurs with standing and routinely seems worse as the day wears on. The pain has been progressive over the past several years. The patient states these symptoms are causing  a profound negative impact on quality of life and daily activities.  The patient denies rest pain or dangling of an extremity off the side of the bed during the night for relief. No open wounds or sores at this time. No history of DVT or phlebitis. No prior interventions or surgeries.  There is a  history of back problems and DJD of the lumbar and sacral spine.    Today the patient has an ABI of 1.36 on the right and 1.16 on the left.  The patient has triphasic tibial artery waveforms bilaterally with good toe waveforms bilaterally.   Review of Systems  All other systems reviewed and are negative.     Objective:   Physical Exam Vitals reviewed.  HENT:     Head: Normocephalic.  Cardiovascular:     Rate and Rhythm: Normal rate.     Pulses:          Dorsalis pedis pulses are 1+ on the right side and 1+ on the left side.       Posterior tibial pulses are 1+ on the right side and 1+ on the left side.  Pulmonary:     Effort: Pulmonary effort is normal.  Neurological:     Mental Status: She is alert and oriented to person, place, and time.  Psychiatric:        Mood and Affect: Mood normal.        Behavior: Behavior normal.        Thought Content: Thought content normal.        Judgment: Judgment normal.    BP (!) 176/84   Pulse 84   Ht 5\' 9"  (1.753 m)   Wt 222 lb (100.7 kg)   BMI 32.78  kg/m   Past Medical History:  Diagnosis Date   Arthritis    Diabetes mellitus without complication (HCC)    Elevated lipids    Hypertension     Social History   Socioeconomic History   Marital status: Divorced    Spouse name: Not on file   Number of children: Not on file   Years of education: Not on file   Highest education level: Not on file  Occupational History   Not on file  Tobacco Use   Smoking status: Former   Smokeless tobacco: Never  Substance and Sexual Activity   Alcohol use: No   Drug use: No   Sexual activity: Not Currently  Other Topics Concern   Not on file  Social History Narrative   Not on file   Social Determinants of Health   Financial Resource Strain: Not on file  Food Insecurity: Not on file  Transportation Needs: Not on file  Physical Activity: Not on file  Stress: Not on file  Social Connections: Not on file  Intimate Partner Violence: Not on file  Past Surgical History:  Procedure Laterality Date   ABDOMINAL HYSTERECTOMY     COLONOSCOPY     COLONOSCOPY WITH PROPOFOL N/A 08/22/2016   Procedure: COLONOSCOPY WITH PROPOFOL;  Surgeon: Manya Silvas, MD;  [facility] END[facility]COPY;  Service: Endoscopy;  Laterality: N/A;   COLONOSCOPY WITH PROPOFOL N/A 08/11/2020   Procedure: COLONOSCOPY WITH PROPOFOL;  Surgeon: Lesly Rubenstein, MD;  Location: [facility] ENDOSCOPY;  Service: Endoscopy;  Laterality: N/A;   ESOPHAGOGASTRODUODENOSCOPY (EGD) WITH PROPOFOL N/A 08/11/2020   Procedure: ESOPHAGOGASTRODUODENOSCOPY (EGD) WITH PROPOFOL;  Surgeon: Lesly Rubenstein, MD;  Location: [facility] ENDOSCOPY;  Service: Endoscopy;  Laterality: N/A;   ltkr     needle in foot removed     REFRACTIVE SURGERY      Family History  Problem Relation Age of Onset   Hypertension Mother    Hypertension Father    Breast cancer Neg Hx     Allergies  Allergen Reactions   Codeine     CBC Latest Ref Rng & Units 08/14/2020 08/13/2020 08/12/2020  WBC 4.0 - 10.5 K/uL -  9.1 -  Hemoglobin 12.0 - 15.0 g/dL 8.5(L) 8.4(L) 8.2(L)  Hematocrit 36.0 - 46.0 % - 26.8(L) 26.1(L)  Platelets 150 - 400 K/uL - 205 -      CMP     Component Value Date/Time   NA 143 08/12/2020 0619   NA 137 08/23/2013 0519   K 3.5 08/12/2020 0619   K 3.4 (L) 08/23/2013 0519   CL 111 08/12/2020 0619   CL 103 08/23/2013 0519   CO2 26 08/12/2020 0619   CO2 30 08/23/2013 0519   GLUCOSE 108 (H) 08/12/2020 0619   GLUCOSE 168 (H) 08/23/2013 0519   BUN 9 08/12/2020 0619   BUN 12 08/23/2013 0519   CREATININE 1.03 (H) 08/12/2020 0619   CREATININE 1.17 08/23/2013 0519   CALCIUM 7.7 (L) 08/12/2020 0619   CALCIUM 8.2 (L) 08/23/2013 0519   PROT 4.8 (L) 08/12/2020 0619   ALBUMIN 2.5 (L) 08/12/2020 0619   AST 17 08/12/2020 0619   ALT 10 08/12/2020 0619   ALKPHOS 40 08/12/2020 0619   BILITOT 0.6 08/12/2020 0619   GFRNONAA 57 (L) 08/12/2020 0619   GFRNONAA 48 (L) 08/23/2013 0519   GFRAA 55 (L) 08/23/2013 0519     VAS [place] ABI WITH/WO TBI  Result Date: 06/10/2021  LOWER EXTREMITY DOPPLER STUDY [name]  Date of Exam:   06/09/2021 Medical Rec #: [id number]       Accession #:    [id number] Date of Birth: 22-Feb-1946      Patient Gender: F Patient Age:   76 years Exam Location:  [facility] Procedure:      VAS [place] ABI WITH/WO TBI Referring Phys: [facility] --------------------------------------------------------------------------------  Indications: Rest pain.  Performing Technologist: Almira Coaster RVS  Examination Guidelines: A complete evaluation includes at minimum, Doppler waveform signals and systolic blood pressure reading at the level of bilateral brachial, anterior tibial, and posterior tibial arteries, when vessel segments are accessible. Bilateral testing is considered an integral part of a complete examination. Photoelectric Plethysmograph (PPG) waveforms and toe systolic pressure readings are included as required and additional duplex testing as  needed. Limited examinations for reoccurring indications may be performed as noted.  ABI Findings: +---------+------------------+-----+---------+--------+ Right    Rt Pressure (mmHg)IndexWaveform Comment  +---------+------------------+-----+---------+--------+ Brachial 143                                      +---------+------------------+-----+---------+--------+  ATA      137                    triphasic.96      +---------+------------------+-----+---------+--------+ PTA      194               1.36 triphasic         +---------+------------------+-----+---------+--------+ Great Toe99                0.69 Normal            +---------+------------------+-----+---------+--------+ +---------+------------------+-----+---------+-------+ Left     Lt Pressure (mmHg)IndexWaveform Comment +---------+------------------+-----+---------+-------+ ATA      144                    triphasic1.01    +---------+------------------+-----+---------+-------+ PTA      166               1.16 triphasic        +---------+------------------+-----+---------+-------+ Great Toe135               0.94 Normal           +---------+------------------+-----+---------+-------+ +-------+-----------+-----------+------------+------------+ ABI/TBIToday's ABIToday's TBIPrevious ABIPrevious TBI +-------+-----------+-----------+------------+------------+ Right  1.36       .69                                 +-------+-----------+-----------+------------+------------+ Left   1.16       .94                                 +-------+-----------+-----------+------------+------------+  Summary: Right: Resting right ankle-brachial index is within normal range. No evidence of significant right lower extremity arterial disease. The right toe-brachial index is normal. Left: Resting left ankle-brachial index is within normal range. No evidence of significant left lower extremity arterial disease. The  left toe-brachial index is normal.  *See table(s) above for measurements and observations.  Electronically signed by Hortencia Pilar MD on 06/10/2021 at 5:23:11 PM.    Final        Assessment & Plan:   1. Leg pain, diffuse, right Recommend:  I do not find evidence of Vascular pathology that would explain the patient's symptoms  The patient has atypical pain symptoms for vascular disease  I do not find evidence of Vascular pathology that would explain the patient's symptoms and I suspect the patient is c/o pseudoclaudication.  Patient should have an evaluation of his LS spine which I defer to the primary service.  Noninvasive studies of the legs do not identify vascular problems  The patient should continue walking and begin a more formal exercise program. The patient should continue his antiplatelet therapy and aggressive treatment of the lipid abnormalities. The patient should begin wearing graduated compression socks 15-20 mmHg strength to control her mild edema.  Patient will follow-up with me on a PRN basis regarding the leg pain  Further work-up of her lower extremity pain is deferred to the primary service      2. Infrarenal abdominal aortic aneurysm (AAA) without rupture The patient will have continued follow-up with her abdominal aortic aneurysm in approximately 6 months.  Patient does not have any worsening signs and symptoms at this moment.  3. 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  Hgb A1C to be monitored as already arranged by primary service   4. Primary hypertension Continue antihypertensive medications as already ordered, these medications have been reviewed and there are no changes at this time.    Current Outpatient  Medications on File Prior to Visit  Medication Sig Dispense Refill   amLODipine (NORVASC) 5 MG tablet Take 1 tablet by mouth daily.     atorvastatin (LIPITOR) 20 MG tablet Take 20 mg by mouth daily.     atorvastatin (LIPITOR) 20 MG tablet Take 1 tablet by mouth daily.     cyanocobalamin 1000 MCG tablet Take by mouth.     furosemide (LASIX) 20 MG tablet Take by mouth.     metFORMIN (GLUCOPHAGE) 500 MG tablet Take 1 tablet by mouth 2 (two) times daily with a meal.     metoprolol tartrate (LOPRESSOR) 25 MG tablet Take 0.5 tablets (12.5 mg total) by mouth 2 (two) times daily. 30 tablet 0   polyethylene glycol (MIRALAX) 17 g packet Take 17 g by mouth 2 (two) times daily. 60 each 0   polyethylene glycol powder (GLYCOLAX/MIRALAX) 17 GM/SCOOP powder Take by mouth.     potassium chloride (KLOR-CON) 10 MEQ tablet Take 10 mEq by mouth daily.     pantoprazole (PROTONIX) 40 MG tablet Take 1 tablet (40 mg total) by mouth daily. 30 tablet 0   No current facility-administered medications on file prior to visit.    There are no Patient Instructions on file for this visit. No follow-ups on file.   Kris Hartmann, NP

## 2021-07-14 DIAGNOSIS — I10 Essential (primary) hypertension: Secondary | ICD-10-CM | POA: Diagnosis not present

## 2021-07-14 DIAGNOSIS — R600 Localized edema: Secondary | ICD-10-CM | POA: Diagnosis not present

## 2021-07-14 DIAGNOSIS — I714 Abdominal aortic aneurysm, without rupture, unspecified: Secondary | ICD-10-CM | POA: Diagnosis not present

## 2021-07-14 DIAGNOSIS — I771 Stricture of artery: Secondary | ICD-10-CM | POA: Diagnosis not present

## 2021-07-14 DIAGNOSIS — Z0000 Encounter for general adult medical examination without abnormal findings: Secondary | ICD-10-CM | POA: Diagnosis not present

## 2021-07-14 DIAGNOSIS — E782 Mixed hyperlipidemia: Secondary | ICD-10-CM | POA: Diagnosis not present

## 2021-07-14 DIAGNOSIS — Z23 Encounter for immunization: Secondary | ICD-10-CM | POA: Diagnosis not present

## 2021-07-14 DIAGNOSIS — Z1211 Encounter for screening for malignant neoplasm of colon: Secondary | ICD-10-CM | POA: Diagnosis not present

## 2021-07-14 DIAGNOSIS — E119 Type 2 diabetes mellitus without complications: Secondary | ICD-10-CM | POA: Diagnosis not present

## 2021-09-15 DIAGNOSIS — K5730 Diverticulosis of large intestine without perforation or abscess without bleeding: Secondary | ICD-10-CM | POA: Diagnosis not present

## 2021-09-15 DIAGNOSIS — Z8719 Personal history of other diseases of the digestive system: Secondary | ICD-10-CM | POA: Diagnosis not present

## 2021-09-15 DIAGNOSIS — Z86010 Personal history of colonic polyps: Secondary | ICD-10-CM | POA: Diagnosis not present

## 2021-11-11 ENCOUNTER — Encounter: Payer: Self-pay | Admitting: *Deleted

## 2021-11-12 ENCOUNTER — Ambulatory Visit
Admission: RE | Admit: 2021-11-12 | Discharge: 2021-11-12 | Disposition: A | Discharge status: Home / Self Care | Payer: Medicare HMO | Attending: Gastroenterology | Admitting: Gastroenterology

## 2021-11-12 ENCOUNTER — Other Ambulatory Visit: Payer: Self-pay

## 2021-11-12 ENCOUNTER — Encounter: Payer: Self-pay | Admitting: *Deleted

## 2021-11-12 ENCOUNTER — Encounter
Admission: RE | Disposition: A | Discharge status: Home / Self Care | Payer: Self-pay | Source: Home / Self Care | Attending: Gastroenterology

## 2021-11-12 ENCOUNTER — Ambulatory Visit: Payer: Medicare HMO | Admitting: Certified Registered Nurse Anesthetist

## 2021-11-12 DIAGNOSIS — K640 First degree hemorrhoids: Secondary | ICD-10-CM | POA: Insufficient documentation

## 2021-11-12 DIAGNOSIS — E785 Hyperlipidemia, unspecified: Secondary | ICD-10-CM | POA: Insufficient documentation

## 2021-11-12 DIAGNOSIS — Z7984 Long term (current) use of oral hypoglycemic drugs: Secondary | ICD-10-CM | POA: Diagnosis not present

## 2021-11-12 DIAGNOSIS — E669 Obesity, unspecified: Secondary | ICD-10-CM | POA: Diagnosis not present

## 2021-11-12 DIAGNOSIS — E119 Type 2 diabetes mellitus without complications: Secondary | ICD-10-CM | POA: Insufficient documentation

## 2021-11-12 DIAGNOSIS — D122 Benign neoplasm of ascending colon: Secondary | ICD-10-CM | POA: Diagnosis not present

## 2021-11-12 DIAGNOSIS — Z6838 Body mass index (BMI) 38.0-38.9, adult: Secondary | ICD-10-CM | POA: Insufficient documentation

## 2021-11-12 DIAGNOSIS — D123 Benign neoplasm of transverse colon: Secondary | ICD-10-CM | POA: Insufficient documentation

## 2021-11-12 DIAGNOSIS — K5730 Diverticulosis of large intestine without perforation or abscess without bleeding: Secondary | ICD-10-CM | POA: Insufficient documentation

## 2021-11-12 DIAGNOSIS — Z87891 Personal history of nicotine dependence: Secondary | ICD-10-CM | POA: Insufficient documentation

## 2021-11-12 DIAGNOSIS — K635 Polyp of colon: Secondary | ICD-10-CM | POA: Diagnosis not present

## 2021-11-12 DIAGNOSIS — I10 Essential (primary) hypertension: Secondary | ICD-10-CM | POA: Insufficient documentation

## 2021-11-12 DIAGNOSIS — Z86010 Personal history of colonic polyps: Secondary | ICD-10-CM | POA: Diagnosis not present

## 2021-11-12 DIAGNOSIS — Z1211 Encounter for screening for malignant neoplasm of colon: Secondary | ICD-10-CM | POA: Diagnosis not present

## 2021-11-12 DIAGNOSIS — K649 Unspecified hemorrhoids: Secondary | ICD-10-CM | POA: Diagnosis not present

## 2021-11-12 HISTORY — PX: COLONOSCOPY WITH PROPOFOL: SHX5780

## 2021-11-12 HISTORY — DX: Personal history of other diseases of the nervous system and sense organs: Z86.69

## 2021-11-12 HISTORY — DX: Hyperlipidemia, unspecified: E78.5

## 2021-11-12 LAB — GLUCOSE, CAPILLARY: Glucose-Capillary: 101 mg/dL — ABNORMAL HIGH (ref 70–99)

## 2021-11-12 SURGERY — COLONOSCOPY WITH PROPOFOL
Anesthesia: General

## 2021-11-12 MED ORDER — LIDOCAINE HCL (CARDIAC) PF 100 MG/5ML IV SOSY
PREFILLED_SYRINGE | INTRAVENOUS | Status: DC | PRN
  Administered 2021-11-12: 80 mg via INTRAVENOUS

## 2021-11-12 MED ORDER — PROPOFOL 10 MG/ML IV BOLUS
INTRAVENOUS | Status: DC | PRN
  Administered 2021-11-12: 20 mg via INTRAVENOUS
  Administered 2021-11-12: 60 mg via INTRAVENOUS

## 2021-11-12 MED ORDER — PROPOFOL 500 MG/50ML IV EMUL
INTRAVENOUS | Status: DC | PRN
  Administered 2021-11-12: 50 ug/kg/min via INTRAVENOUS

## 2021-11-12 MED ORDER — SODIUM CHLORIDE 0.9 % IV SOLN: INTRAVENOUS | Status: DC

## 2021-11-12 NOTE — Anesthesia Preprocedure Evaluation (Addendum)
Anesthesia Evaluation  ?Patient identified by MRN, date of birth, ID band ?Patient awake ? ? ? ?Reviewed: ?Allergy & Precautions, H&P , NPO status , Patient's Chart, lab work & pertinent test results ? ?History of Anesthesia Complications ?Negative for: history of anesthetic complications ? ?Airway ?Mallampati: II ? ?TM Distance: >3 FB ? ? ? ? Dental ? ?(+) Upper Dentures, Missing, Loose,  ?  ?Pulmonary ?neg pulmonary ROS, neg sleep apnea, neg COPD, former smoker,  ?  ?breath sounds clear to auscultation ? ? ? ? ? ? Cardiovascular ?Exercise Tolerance: Good ?hypertension, Pt. on medications ?(-) angina(-) Past MI and (-) Cardiac Stents (-) dysrhythmias  ?Rhythm:regular Rate:Normal ? ? ?  ?Neuro/Psych ?negative neurological ROS ? negative psych ROS  ? GI/Hepatic ?Neg liver ROS, h/o rectal bleeding ?  ?Endo/Other  ?diabetes, Type 2, Oral Hypoglycemic Agents ? Renal/GU ?negative Renal ROS  ?negative genitourinary ?  ?Musculoskeletal ? ?(+) Arthritis ,  ? Abdominal ?(+) + obese,   ?Peds ? Hematology ? ?(+) Blood dyscrasia, anemia , Hgb 7.8   ?Anesthesia Other Findings ?Obese ? ?Past Medical History: ?No date: Arthritis ?No date: Diabetes mellitus without complication ([place]) ?No date: Elevated lipids ?No date: Hypertension ? ?Past Surgical History: ?No date: ABDOMINAL HYSTERECTOMY ?No date: COLONOSCOPY ?08/22/2016: COLONOSCOPY WITH PROPOFOL; N/A ?    Comment:  Procedure: COLONOSCOPY WITH PROPOFOL;  Surgeon: Herbie Baltimore T ?             Vira Agar, MD;  Location: [facility];  Service:  ?             Endoscopy;  Laterality: N/A; ?No date: ltkr ?No date: needle in foot removed ?No date: REFRACTIVE SURGERY ? ?BMI   ? Body Mass Index: 38.22 kg/m?  ?  ? ? Reproductive/Obstetrics ?negative OB ROS ? ?  ? ? ? ? ? ? ? ? ? ? ? ? ? ?  ?  ? ? ? ? ? ? ? ?Anesthesia Physical ? ?Anesthesia Plan ? ?ASA: II ? ?Anesthesia Plan: General  ? ?Post-op Pain Management:   ? ?Induction: Intravenous ? ?PONV Risk Score  and Plan: Propofol infusion and TIVA ? ?Airway Management Planned: Simple Face Mask and Natural Airway ? ?Additional Equipment:  ? ?Intra-op Plan:  ? ?Post-operative Plan:  ? ?Informed Consent: I have reviewed the patients History and Physical, chart, labs and discussed the procedure including the risks, benefits and alternatives for the proposed anesthesia with the patient or authorized representative who has indicated his/her understanding and acceptance.  ? ? ? ?Dental Advisory Given ? ?Plan Discussed with: Anesthesiologist, CRNA and Surgeon ? ?Anesthesia Plan Comments:   ? ? ? ? ? ? ?Anesthesia Quick Evaluation ? ?

## 2021-11-12 NOTE — H&P (Signed)
Outpatient short stay form Pre-procedure ?11/12/2021  ?Lesly Rubenstein, MD ? ?Primary Physician: Marinda Elk, MD ? ?Reason for visit:  Screening ? ?History of present illness:   ? ?76 y/o lady with history of diverticular bleed, hypertension, DM II, and HLD here for colonoscopy for screening. No blood thinners. No abdominal surgeries. Last colonoscopy in 2017. No family history of GI malignancies. ? ? ? ?Current Facility-Administered Medications:  ?  0.9 %  sodium chloride infusion, , Intravenous, Continuous, Evon Lopezperez, Hilton Cork, MD, Last Rate: 20 mL/hr at 11/12/21 1050, New Bag at 11/12/21 1050 ? ?Medications Prior to Admission  ?Medication Sig Dispense Refill Last Dose  ? amLODipine (NORVASC) 5 MG tablet Take 1 tablet by mouth daily.   11/12/2021 at 0200  ? atorvastatin (LIPITOR) 20 MG tablet Take 1 tablet by mouth daily.   11/11/2021  ? furosemide (LASIX) 20 MG tablet Take by mouth.   11/12/2021 at 0200  ? metFORMIN (GLUCOPHAGE) 500 MG tablet Take 1 tablet by mouth 2 (two) times daily with a meal.   11/11/2021  ? metoprolol tartrate (LOPRESSOR) 25 MG tablet Take 0.5 tablets (12.5 mg total) by mouth 2 (two) times daily. 30 tablet 0 11/12/2021 at 0200  ? Multiple Vitamins-Minerals (SENTRY PO) Take by mouth daily.   Past Week  ? potassium chloride (KLOR-CON) 10 MEQ tablet Take 10 mEq by mouth daily.   Past Week  ? timolol (TIMOPTIC) 0.5 % ophthalmic solution 1 drop 2 (two) times daily.     ? atorvastatin (LIPITOR) 20 MG tablet Take 20 mg by mouth daily.     ? pantoprazole (PROTONIX) 40 MG tablet Take 1 tablet (40 mg total) by mouth daily. 30 tablet 0   ? polyethylene glycol (MIRALAX) 17 g packet Take 17 g by mouth 2 (two) times daily. 60 each 0   ? polyethylene glycol powder (GLYCOLAX/MIRALAX) 17 GM/SCOOP powder Take by mouth.     ? ? ? ?Allergies  ?Allergen Reactions  ? Codeine   ? ? ? ?Past Medical History:  ?Diagnosis Date  ? Arthritis   ? Diabetes mellitus without complication ([place])   ? Elevated lipids   ?  History of cataract   ? Hyperlipidemia   ? Hypertension   ? ? ?Review of systems:  Otherwise negative.  ? ? ?Physical Exam ? ?Gen: Alert, oriented. Appears stated age.  ?HEENT: PERRLA. ?Lungs: No respiratory distress ?CV: RRR ?Abd: soft, benign, no masses ?Ext: No edema ? ? ? ?Planned procedures: Proceed with colonoscopy. The patient understands the nature of the planned procedure, indications, risks, alternatives and potential complications including but not limited to bleeding, infection, perforation, damage to internal organs and possible oversedation/side effects from anesthesia. The patient agrees and gives consent to proceed.  ?Please refer to procedure notes for findings, recommendations and patient disposition/instructions.  ? ? ? ?Lesly Rubenstein, MD ?[facility] ? ? ? ?  ? ?

## 2021-11-12 NOTE — Interval H&P Note (Signed)
History and Physical Interval Note: ? ?11/12/2021 ?11:04 AM ? ?Rose Perry  has presented today for surgery, with the diagnosis of diverticulosis ?h/o rectal bleeding.  The various methods of treatment have been discussed with the patient and family. After consideration of risks, benefits and other options for treatment, the patient has consented to  Procedure(s) with comments: ?COLONOSCOPY WITH PROPOFOL (N/A) - DM as a surgical intervention.  The patient's history has been reviewed, patient examined, no change in status, stable for surgery.  I have reviewed the patient's chart and labs.  Questions were answered to the patient's satisfaction.   ? ? ?Hilton Cork Denim Kalmbach ? ?Ok to proceed with colonoscopy ?

## 2021-11-12 NOTE — Anesthesia Postprocedure Evaluation (Signed)
Anesthesia Post Note ? ?Patient: Rose Perry ? ?Procedure(s) Performed: COLONOSCOPY WITH PROPOFOL ? ?Patient location during evaluation: Endoscopy ?Anesthesia Type: General ?Level of consciousness: awake and alert ?Pain management: pain level controlled ?Vital Signs Assessment: post-procedure vital signs reviewed and stable ?Respiratory status: spontaneous breathing, nonlabored ventilation and respiratory function stable ?Cardiovascular status: blood pressure returned to baseline and stable ?Postop Assessment: no apparent nausea or vomiting ?Anesthetic complications: no ? ? ?No notable events documented. ? ? ?Last Vitals:  ?Vitals:  ? 11/12/21 1140 11/12/21 1150  ?BP: (!) 147/91 (!) 123/93  ?Pulse:    ?Resp:    ?Temp:    ?SpO2:    ?  ?Last Pain:  ?Vitals:  ? 11/12/21 1150  ?TempSrc:   ?PainSc: 0-No pain  ? ? ?  ?  ?  ?  ?  ?  ? ?[place] Ouch ? ? ? ? ?

## 2021-11-12 NOTE — Op Note (Addendum)
[facility] ?Gastroenterology ?Patient Name: Rose Perry ?Procedure Date: 11/12/2021 10:51 AM ?MRN: [id number] ?Account #: [id number] ?Date of Birth: August 08, 1946 ?Admit Type: Outpatient ?Age: 76 ?Room: [facility] ENDO ROOM 3 ?Gender: Female ?Note Status: Supervisor Override ?Instrument Name: Peds Colonoscope [id number] ?Procedure:             Colonoscopy ?Indications:           Rectal bleeding, Personal history of colonic polyps,  ?                       Diverticula ?Providers:             Andrey Farmer MD, MD ?Referring MD:          Precious Bard, MD (Referring MD) ?Medicines:             Monitored Anesthesia Care ?Complications:         No immediate complications. Estimated blood loss:  ?                       Minimal. ?Procedure:             Pre-Anesthesia Assessment: ?                       - Prior to the procedure, a History and Physical was  ?                       performed, and patient medications and allergies were  ?                       reviewed. The patient is competent. The risks and  ?                       benefits of the procedure and the sedation options and  ?                       risks were discussed with the patient. All questions  ?                       were answered and informed consent was obtained.  ?                       Patient identification and proposed procedure were  ?                       verified by the physician, the nurse, the  ?                       anesthesiologist, the anesthetist and the technician  ?                       in the endoscopy suite. Mental Status Examination:  ?                       alert and oriented. Airway Examination: normal  ?                       oropharyngeal airway and neck mobility. Respiratory  ?  Examination: clear to auscultation. CV Examination:  ?                       normal. Prophylactic Antibiotics: The patient does not  ?                       require prophylactic antibiotics. Prior  ?                        Anticoagulants: The patient has taken no previous  ?                       anticoagulant or antiplatelet agents. ASA Grade  ?                       Assessment: II - A patient with mild systemic disease.  ?                       After reviewing the risks and benefits, the patient  ?                       was deemed in satisfactory condition to undergo the  ?                       procedure. The anesthesia plan was to use monitored  ?                       anesthesia care (MAC). Immediately prior to  ?                       administration of medications, the patient was  ?                       re-assessed for adequacy to receive sedatives. The  ?                       heart rate, respiratory rate, oxygen saturations,  ?                       blood pressure, adequacy of pulmonary ventilation, and  ?                       response to care were monitored throughout the  ?                       procedure. The physical status of the patient was  ?                       re-assessed after the procedure. ?                       After obtaining informed consent, the colonoscope was  ?                       passed under direct vision. Throughout the procedure,  ?                       the patient's blood pressure, pulse, and oxygen  ?  saturations were monitored continuously. The  ?                       Colonoscope was introduced through the anus and  ?                       advanced to the the cecum, identified by appendiceal  ?                       orifice and ileocecal valve. The colonoscopy was  ?                       somewhat difficult due to multiple diverticula in the  ?                       colon. Successful completion of the procedure was  ?                       aided by withdrawing the scope and replacing with the  ?                       pediatric colonoscope. The patient tolerated the  ?                       procedure well. The quality of the bowel preparation  ?                        was fair. ?Findings: ?     The perianal and digital rectal examinations were normal. ?     Many small and large-mouthed diverticula were found in the sigmoid  ?     colon, descending colon, splenic flexure, transverse colon, hepatic  ?     flexure, ascending colon and cecum. ?     A 1 mm polyp was found in the ascending colon. The polyp was sessile.  ?     The polyp was removed with a jumbo cold forceps. Resection and retrieval  ?     were complete. Estimated blood loss was minimal. ?     A 1 mm polyp was found in the transverse colon. The polyp was sessile.  ?     The polyp was removed with a jumbo cold forceps. Resection and retrieval  ?     were complete. Estimated blood loss was minimal. ?     Internal hemorrhoids were found during retroflexion. The hemorrhoids  ?     were Grade I (internal hemorrhoids that do not prolapse). ?     The exam was otherwise without abnormality on direct and retroflexion  ?     views. ?Impression:            - Preparation of the colon was fair. ?                       - Diverticulosis in the sigmoid colon, in the  ?                       descending colon, at the splenic flexure, in the  ?                       transverse colon, at the hepatic flexure,  in the  ?                       ascending colon and in the cecum. ?                       - One 1 mm polyp in the ascending colon, removed with  ?                       a jumbo cold forceps. Resected and retrieved. ?                       - One 1 mm polyp in the transverse colon, removed with  ?                       a jumbo cold forceps. Resected and retrieved. ?                       - Internal hemorrhoids. ?                       - The examination was otherwise normal on direct and  ?                       retroflexion views. ?Recommendation:        - Discharge patient to home. ?                       - Resume previous diet. ?                       - Continue present medications. ?                       - Await pathology results. ?                        - Repeat colonoscopy in 1-2 years if benefits outweigh  ?                       risks because the bowel preparation was suboptimal. ?                       - Return to referring physician as previously  ?                       scheduled. ?Procedure Code(s):     --- Professional --- ?                       [phone], Colonoscopy, flexible; with biopsy, single or  ?                       multiple ?Diagnosis Code(s):     --- Professional --- ?                       Z12.11, Encounter for screening for malignant neoplasm  ?                       of colon ?  K64.0, First degree hemorrhoids ?                       K63.5, Polyp of colon ?                       K57.30, Diverticulosis of large intestine without  ?                       perforation or abscess without bleeding ?CPT copyright 2019 American Medical Association. All rights reserved. ?The codes documented in this report are preliminary and upon coder review may  ?be revised to meet current compliance requirements. ?Andrey Farmer MD, MD ?11/12/2021 11:32:42 AM ?Number of Addenda: 0 ?Note Initiated On: 11/12/2021 10:51 AM ?Scope Withdrawal Time: 0 hours 10 minutes 40 seconds  ?Total Procedure Duration: 0 hours 19 minutes 11 seconds  ?Estimated Blood Loss:  Estimated blood loss was minimal. ?     [facility] ?

## 2021-11-12 NOTE — Transfer of Care (Signed)
Immediate Anesthesia Transfer of Care Note ? ?Patient: Rose Perry ? ?Procedure(s) Performed: COLONOSCOPY WITH PROPOFOL ? ?Patient Location: PACU and Endoscopy Unit ? ?Anesthesia Type:General ? ?Level of Consciousness: drowsy and patient cooperative ? ?Airway & Oxygen Therapy: Patient Spontanous Breathing ? ?Post-op Assessment: Report given to RN and Post -op Vital signs reviewed and stable ? ?Post vital signs: Reviewed and stable ? ?Last Vitals:  ?Vitals Value Taken Time  ?BP 125/74 11/12/21 1130  ?Temp 35.9 ?C 11/12/21 1130  ?Pulse 69 11/12/21 1134  ?Resp 16 11/12/21 1134  ?SpO2 96 % 11/12/21 1134  ?Vitals shown include unvalidated device data. ? ?Last Pain:  ?Vitals:  ? 11/12/21 1130  ?TempSrc: Temporal  ?PainSc:   ?   ? ?  ? ?Complications: No notable events documented. ?

## 2021-11-15 ENCOUNTER — Encounter: Payer: Self-pay | Admitting: Gastroenterology

## 2021-11-15 LAB — SURGICAL PATHOLOGY

## 2021-11-26 ENCOUNTER — Other Ambulatory Visit (INDEPENDENT_AMBULATORY_CARE_PROVIDER_SITE_OTHER): Payer: Self-pay | Admitting: Nurse Practitioner

## 2021-11-26 DIAGNOSIS — I714 Abdominal aortic aneurysm, without rupture, unspecified: Secondary | ICD-10-CM

## 2021-11-28 NOTE — Progress Notes (Deleted)
? ? ?MRN : [id number] ? ?Rose Perry is a 76 y.o. (08/09/1946) female who presents with chief complaint of check AAA. ? ?History of Present Illness:  ? ?The patient presents to the office for evaluation of an abdominal aortic aneurysm. The aneurysm was found incidentally by CT scan done for GI bleed. Patient denies abdominal pain or unusual back pain, no other abdominal complaints.  No history of an acute onset of painful blue discoloration of the toes. ? ?However, today she is noting that she is having excruciating pain of her right lower extremity.  She said the weekend was miserable and if she had a chainsaw she would have cut her own leg off.  Pain is occurring at rest as well as with walking and with sitting.  She notes that it is in the back as well as her hip but appears to radiate down her whole leg.  There is no documented past history of claudication or rest pain symptoms of the lower extremities. ?  ?No family history of AAA.  ?  ?Patient denies amaurosis fugax or TIA symptoms.    ?The patient denies angina or shortness of breath. ?  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  ?Duplex ultrasound today shows the aneurysm is 4.3 cm in maximal diameter with the right common iliac measuring 1.5 cm and the left common iliac measuring 1.2 cm. ? ?No outpatient medications have been marked as taking for the 11/29/21 encounter (Appointment) with Delana Meyer, Dolores Lory, MD.  ? ? ?Past Medical History:  ?Diagnosis Date  ? Arthritis   ? Diabetes mellitus without complication ([place])   ? Elevated lipids   ? History of cataract   ? Hyperlipidemia   ? Hypertension   ? ? ?Past Surgical History:  ?Procedure Laterality Date  ? ABDOMINAL HYSTERECTOMY    ? COLONOSCOPY    ? COLONOSCOPY WITH PROPOFOL N/A 08/22/2016  ? Procedure: COLONOSCOPY WITH PROPOFOL;  Surgeon: Manya Silvas, MD;  Location: [facility] ENDOSCOPY;  Service: Endoscopy;  Laterality: N/A;  ?  COLONOSCOPY WITH PROPOFOL N/A 08/11/2020  ? Procedure: COLONOSCOPY WITH PROPOFOL;  Surgeon: Lesly Rubenstein, MD;  Location: [facility] ENDOSCOPY;  Service: Endoscopy;  Laterality: N/A;  ? COLONOSCOPY WITH PROPOFOL N/A 11/12/2021  ? Procedure: COLONOSCOPY WITH PROPOFOL;  Surgeon: Lesly Rubenstein, MD;  Location: [facility] ENDOSCOPY;  Service: Endoscopy;  Laterality: N/A;  DM  ? ESOPHAGOGASTRODUODENOSCOPY (EGD) WITH PROPOFOL N/A 08/11/2020  ? Procedure: ESOPHAGOGASTRODUODENOSCOPY (EGD) WITH PROPOFOL;  Surgeon: Lesly Rubenstein, MD;  Location: [facility] ENDOSCOPY;  Service: Endoscopy;  Laterality: N/A;  ? JOINT REPLACEMENT Left   ? left total knee replacement  ? ltkr    ? needle in foot removed    ? REFRACTIVE SURGERY    ? ? ?Social History ?Social History  ? ?Tobacco Use  ? Smoking status: Former  ? Smokeless tobacco: Never  ?Substance Use Topics  ? Alcohol use: No  ? Drug use: No  ? ? ?Family History ?Family History  ?Problem Relation Age of Onset  ? Hypertension Mother   ? Hyperlipidemia Mother   ? Hypertension Father   ? Coronary artery disease Brother   ? Breast cancer Neg Hx   ? ? ?Allergies  ?Allergen Reactions  ? Codeine   ? ? ? ?REVIEW OF SYSTEMS (Negative unless checked) ? ?Constitutional: '[]'$ Weight loss  '[]'$ Fever  '[]'$ Chills ?Cardiac: '[]'$ Chest pain   '[]'$ Chest pressure   '[]'$   Palpitations   '[]'$ Shortness of breath when laying flat   '[]'$ Shortness of breath with exertion. ?Vascular:  '[]'$ Pain in legs with walking   '[]'$ Pain in legs at rest  '[]'$ History of DVT   '[]'$ Phlebitis   '[]'$ Swelling in legs   '[]'$ Varicose veins   '[]'$ Non-healing ulcers ?Pulmonary:   '[]'$ Uses home oxygen   '[]'$ Productive cough   '[]'$ Hemoptysis   '[]'$ Wheeze  '[]'$ COPD   '[]'$ Asthma ?Neurologic:  '[]'$ Dizziness   '[]'$ Seizures   '[]'$ History of stroke   '[]'$ History of TIA  '[]'$ Aphasia   '[]'$ Vissual changes   '[]'$ Weakness or numbness in arm   '[]'$ Weakness or numbness in leg ?Musculoskeletal:   '[]'$ Joint swelling   '[]'$ Joint pain   '[]'$ Low back pain ?Hematologic:  '[]'$ Easy bruising  '[]'$ Easy bleeding    '[]'$ Hypercoagulable state   '[]'$ Anemic ?Gastrointestinal:  '[]'$ Diarrhea   '[]'$ Vomiting  '[]'$ Gastroesophageal reflux/heartburn   '[]'$ Difficulty swallowing. ?Genitourinary:  '[]'$ Chronic kidney disease   '[]'$ Difficult urination  '[]'$ Frequent urination   '[]'$ Blood in urine ?Skin:  '[]'$ Rashes   '[]'$ Ulcers  ?Psychological:  '[]'$ History of anxiety   '[]'$  History of major depression. ? ?Physical Examination ? ?There were no vitals filed for this visit. ?There is no height or weight on file to calculate BMI. ?Gen: WD/WN, NAD ?Head: [place]/AT, No temporalis wasting.  ?Ear/Nose/Throat: Hearing grossly intact, nares w/o erythema or drainage ?Eyes: PER, EOMI, sclera nonicteric.  ?Neck: Supple, no masses.  No bruit or JVD.  ?Pulmonary:  Good air movement, no audible wheezing, no use of accessory muscles.  ?Cardiac: RRR, normal S1, S2, no Murmurs. ?Vascular:  *** ?Vessel Right Left  ?Radial Palpable Palpable  ?Carotid Palpable Palpable  ?PT Palpable Palpable  ?DP Palpable Palpable  ?Gastrointestinal: soft, non-distended. No guarding/no peritoneal signs.  ?Musculoskeletal: M/S 5/5 throughout.  No visible deformity.  ?Neurologic: CN 2-12 intact. Pain and light touch intact in extremities.  Symmetrical.  Speech is fluent. Motor exam as listed above. ?Psychiatric: Judgment intact, Mood & affect appropriate for pt's clinical situation. ?Dermatologic: No rashes or ulcers noted.  No changes consistent with cellulitis. ? ? ?CBC ?Lab Results  ?Component Value Date  ? WBC 9.1 08/13/2020  ? HGB 8.5 (L) 08/14/2020  ? HCT 26.8 (L) 08/13/2020  ? MCV 87.3 08/13/2020  ? PLT 205 08/13/2020  ? ? ?BMET ?   ?Component Value Date/Time  ? NA 143 08/12/2020 0619  ? NA 137 08/23/2013 0519  ? K 3.5 08/12/2020 0619  ? K 3.4 (L) 08/23/2013 0519  ? CL 111 08/12/2020 0619  ? CL 103 08/23/2013 0519  ? CO2 26 08/12/2020 0619  ? CO2 30 08/23/2013 0519  ? GLUCOSE 108 (H) 08/12/2020 4098  ? GLUCOSE 168 (H) 08/23/2013 0519  ? BUN 9 08/12/2020 0619  ? BUN 12 08/23/2013 0519  ? CREATININE 1.03 (H)  08/12/2020 1191  ? CREATININE 1.17 08/23/2013 0519  ? CALCIUM 7.7 (L) 08/12/2020 4782  ? CALCIUM 8.2 (L) 08/23/2013 0519  ? GFRNONAA 57 (L) 08/12/2020 9562  ? GFRNONAA 48 (L) 08/23/2013 0519  ? GFRAA 55 (L) 08/23/2013 0519  ? ?CrCl cannot be calculated (Patient's most recent lab result is older than the maximum 21 days allowed.). ? ?COAG ?Lab Results  ?Component Value Date  ? INR 1.0 08/09/2020  ? INR 1.0 08/07/2013  ? ? ?Radiology ?No results found. ? ? ?Assessment/Plan ?There are no diagnoses linked to this encounter. ? ? ?Hortencia Pilar, MD ? ?11/28/2021 ?4:41 PM ? ?  ?

## 2021-11-29 ENCOUNTER — Ambulatory Visit (INDEPENDENT_AMBULATORY_CARE_PROVIDER_SITE_OTHER): Payer: Medicare HMO | Admitting: Vascular Surgery

## 2021-11-29 ENCOUNTER — Encounter (INDEPENDENT_AMBULATORY_CARE_PROVIDER_SITE_OTHER): Payer: Medicare HMO

## 2021-11-29 ENCOUNTER — Encounter (INDEPENDENT_AMBULATORY_CARE_PROVIDER_SITE_OTHER): Payer: Self-pay

## 2021-11-29 DIAGNOSIS — I771 Stricture of artery: Secondary | ICD-10-CM

## 2021-11-29 DIAGNOSIS — E782 Mixed hyperlipidemia: Secondary | ICD-10-CM

## 2021-11-29 DIAGNOSIS — I7143 Infrarenal abdominal aortic aneurysm, without rupture: Secondary | ICD-10-CM

## 2021-11-29 DIAGNOSIS — E113292 Type 2 diabetes mellitus with mild nonproliferative diabetic retinopathy without macular edema, left eye: Secondary | ICD-10-CM

## 2021-11-29 DIAGNOSIS — I10 Essential (primary) hypertension: Secondary | ICD-10-CM

## 2021-11-29 DIAGNOSIS — I872 Venous insufficiency (chronic) (peripheral): Secondary | ICD-10-CM

## 2022-01-04 DIAGNOSIS — E782 Mixed hyperlipidemia: Secondary | ICD-10-CM | POA: Diagnosis not present

## 2022-01-04 DIAGNOSIS — I10 Essential (primary) hypertension: Secondary | ICD-10-CM | POA: Diagnosis not present

## 2022-01-04 DIAGNOSIS — E119 Type 2 diabetes mellitus without complications: Secondary | ICD-10-CM | POA: Diagnosis not present

## 2022-01-12 DIAGNOSIS — Z0000 Encounter for general adult medical examination without abnormal findings: Secondary | ICD-10-CM | POA: Diagnosis not present

## 2022-01-12 DIAGNOSIS — I10 Essential (primary) hypertension: Secondary | ICD-10-CM | POA: Diagnosis not present

## 2022-01-12 DIAGNOSIS — E119 Type 2 diabetes mellitus without complications: Secondary | ICD-10-CM | POA: Diagnosis not present

## 2022-01-12 DIAGNOSIS — I774 Celiac artery compression syndrome: Secondary | ICD-10-CM | POA: Diagnosis not present

## 2022-01-12 DIAGNOSIS — E782 Mixed hyperlipidemia: Secondary | ICD-10-CM | POA: Diagnosis not present

## 2022-01-12 DIAGNOSIS — I714 Abdominal aortic aneurysm, without rupture, unspecified: Secondary | ICD-10-CM | POA: Diagnosis not present

## 2022-04-19 ENCOUNTER — Other Ambulatory Visit: Payer: Self-pay | Admitting: Physician Assistant

## 2022-04-19 DIAGNOSIS — Z1231 Encounter for screening mammogram for malignant neoplasm of breast: Secondary | ICD-10-CM

## 2022-05-30 ENCOUNTER — Ambulatory Visit
Admission: RE | Admit: 2022-05-30 | Discharge: 2022-05-30 | Disposition: A | Discharge status: Home / Self Care | Payer: Medicare HMO | Source: Ambulatory Visit | Attending: Physician Assistant | Admitting: Physician Assistant

## 2022-05-30 DIAGNOSIS — Z1231 Encounter for screening mammogram for malignant neoplasm of breast: Secondary | ICD-10-CM | POA: Insufficient documentation

## 2022-07-19 DIAGNOSIS — H35351 Cystoid macular degeneration, right eye: Secondary | ICD-10-CM | POA: Diagnosis not present

## 2022-07-19 DIAGNOSIS — I10 Essential (primary) hypertension: Secondary | ICD-10-CM | POA: Diagnosis not present

## 2022-07-19 DIAGNOSIS — I890 Lymphedema, not elsewhere classified: Secondary | ICD-10-CM | POA: Diagnosis not present

## 2022-07-19 DIAGNOSIS — I771 Stricture of artery: Secondary | ICD-10-CM | POA: Diagnosis not present

## 2022-07-19 DIAGNOSIS — I714 Abdominal aortic aneurysm, without rupture, unspecified: Secondary | ICD-10-CM | POA: Diagnosis not present

## 2022-07-19 DIAGNOSIS — E782 Mixed hyperlipidemia: Secondary | ICD-10-CM | POA: Diagnosis not present

## 2022-07-19 DIAGNOSIS — E119 Type 2 diabetes mellitus without complications: Secondary | ICD-10-CM | POA: Diagnosis not present

## 2022-07-19 DIAGNOSIS — Z0000 Encounter for general adult medical examination without abnormal findings: Secondary | ICD-10-CM | POA: Diagnosis not present

## 2022-10-18 DIAGNOSIS — Z1211 Encounter for screening for malignant neoplasm of colon: Secondary | ICD-10-CM | POA: Diagnosis not present

## 2022-10-18 DIAGNOSIS — E119 Type 2 diabetes mellitus without complications: Secondary | ICD-10-CM | POA: Diagnosis not present

## 2022-10-18 DIAGNOSIS — K5730 Diverticulosis of large intestine without perforation or abscess without bleeding: Secondary | ICD-10-CM | POA: Diagnosis not present

## 2022-10-18 DIAGNOSIS — K640 First degree hemorrhoids: Secondary | ICD-10-CM | POA: Diagnosis not present

## 2022-10-18 DIAGNOSIS — Z7984 Long term (current) use of oral hypoglycemic drugs: Secondary | ICD-10-CM | POA: Diagnosis not present

## 2022-10-18 DIAGNOSIS — D123 Benign neoplasm of transverse colon: Secondary | ICD-10-CM | POA: Diagnosis not present

## 2022-10-18 DIAGNOSIS — I10 Essential (primary) hypertension: Secondary | ICD-10-CM | POA: Diagnosis not present

## 2023-01-11 DIAGNOSIS — E782 Mixed hyperlipidemia: Secondary | ICD-10-CM | POA: Diagnosis not present

## 2023-01-11 DIAGNOSIS — E119 Type 2 diabetes mellitus without complications: Secondary | ICD-10-CM | POA: Diagnosis not present

## 2023-01-11 DIAGNOSIS — I10 Essential (primary) hypertension: Secondary | ICD-10-CM | POA: Diagnosis not present

## 2023-01-18 ENCOUNTER — Other Ambulatory Visit: Payer: Self-pay | Admitting: Physician Assistant

## 2023-01-18 DIAGNOSIS — I872 Venous insufficiency (chronic) (peripheral): Secondary | ICD-10-CM | POA: Diagnosis not present

## 2023-01-18 DIAGNOSIS — I771 Stricture of artery: Secondary | ICD-10-CM | POA: Diagnosis not present

## 2023-01-18 DIAGNOSIS — I10 Essential (primary) hypertension: Secondary | ICD-10-CM | POA: Diagnosis not present

## 2023-01-18 DIAGNOSIS — E782 Mixed hyperlipidemia: Secondary | ICD-10-CM | POA: Diagnosis not present

## 2023-01-18 DIAGNOSIS — Z1231 Encounter for screening mammogram for malignant neoplasm of breast: Secondary | ICD-10-CM | POA: Diagnosis not present

## 2023-01-18 DIAGNOSIS — E119 Type 2 diabetes mellitus without complications: Secondary | ICD-10-CM | POA: Diagnosis not present

## 2023-01-18 DIAGNOSIS — I714 Abdominal aortic aneurysm, without rupture, unspecified: Secondary | ICD-10-CM | POA: Diagnosis not present

## 2023-01-18 DIAGNOSIS — Z0000 Encounter for general adult medical examination without abnormal findings: Secondary | ICD-10-CM | POA: Diagnosis not present

## 2023-05-16 DIAGNOSIS — Z86010 Personal history of colonic polyps: Secondary | ICD-10-CM | POA: Diagnosis not present

## 2023-05-16 DIAGNOSIS — Z8719 Personal history of other diseases of the digestive system: Secondary | ICD-10-CM | POA: Diagnosis not present

## 2023-05-16 DIAGNOSIS — K5730 Diverticulosis of large intestine without perforation or abscess without bleeding: Secondary | ICD-10-CM | POA: Diagnosis not present

## 2023-06-01 ENCOUNTER — Ambulatory Visit
Admission: RE | Admit: 2023-06-01 | Discharge: 2023-06-01 | Disposition: A | Discharge status: Home / Self Care | Payer: Medicare HMO | Source: Ambulatory Visit | Attending: Physician Assistant | Admitting: Physician Assistant

## 2023-06-01 DIAGNOSIS — Z1231 Encounter for screening mammogram for malignant neoplasm of breast: Secondary | ICD-10-CM | POA: Insufficient documentation

## 2023-07-18 DIAGNOSIS — I10 Essential (primary) hypertension: Secondary | ICD-10-CM | POA: Diagnosis not present

## 2023-07-18 DIAGNOSIS — E119 Type 2 diabetes mellitus without complications: Secondary | ICD-10-CM | POA: Diagnosis not present

## 2023-07-18 DIAGNOSIS — E782 Mixed hyperlipidemia: Secondary | ICD-10-CM | POA: Diagnosis not present

## 2023-07-25 DIAGNOSIS — I129 Hypertensive chronic kidney disease with stage 1 through stage 4 chronic kidney disease, or unspecified chronic kidney disease: Secondary | ICD-10-CM | POA: Diagnosis not present

## 2023-07-25 DIAGNOSIS — N1831 Chronic kidney disease, stage 3a: Secondary | ICD-10-CM | POA: Diagnosis not present

## 2023-07-25 DIAGNOSIS — Z23 Encounter for immunization: Secondary | ICD-10-CM | POA: Diagnosis not present

## 2023-07-25 DIAGNOSIS — Z0000 Encounter for general adult medical examination without abnormal findings: Secondary | ICD-10-CM | POA: Diagnosis not present

## 2023-07-25 DIAGNOSIS — I714 Abdominal aortic aneurysm, without rupture, unspecified: Secondary | ICD-10-CM | POA: Diagnosis not present

## 2023-07-25 DIAGNOSIS — E782 Mixed hyperlipidemia: Secondary | ICD-10-CM | POA: Diagnosis not present

## 2023-07-25 DIAGNOSIS — E1122 Type 2 diabetes mellitus with diabetic chronic kidney disease: Secondary | ICD-10-CM | POA: Diagnosis not present

## 2023-07-25 DIAGNOSIS — Z780 Asymptomatic menopausal state: Secondary | ICD-10-CM | POA: Diagnosis not present

## 2023-07-31 ENCOUNTER — Encounter: Payer: Self-pay | Admitting: *Deleted

## 2023-08-07 DIAGNOSIS — M8588 Other specified disorders of bone density and structure, other site: Secondary | ICD-10-CM | POA: Diagnosis not present

## 2023-08-18 ENCOUNTER — Other Ambulatory Visit: Payer: Self-pay

## 2023-08-18 ENCOUNTER — Ambulatory Visit: Payer: Medicare HMO | Admitting: Anesthesiology

## 2023-08-18 ENCOUNTER — Ambulatory Visit
Admission: RE | Admit: 2023-08-18 | Discharge: 2023-08-18 | Disposition: A | Discharge status: Home / Self Care | Payer: Medicare HMO | Attending: Gastroenterology | Admitting: Gastroenterology

## 2023-08-18 ENCOUNTER — Encounter: Payer: Self-pay | Admitting: *Deleted

## 2023-08-18 ENCOUNTER — Encounter
Admission: RE | Disposition: A | Discharge status: Home / Self Care | Payer: Self-pay | Source: Home / Self Care | Attending: Gastroenterology

## 2023-08-18 DIAGNOSIS — K635 Polyp of colon: Secondary | ICD-10-CM | POA: Diagnosis not present

## 2023-08-18 DIAGNOSIS — E119 Type 2 diabetes mellitus without complications: Secondary | ICD-10-CM | POA: Diagnosis not present

## 2023-08-18 DIAGNOSIS — I10 Essential (primary) hypertension: Secondary | ICD-10-CM | POA: Diagnosis not present

## 2023-08-18 DIAGNOSIS — Z1211 Encounter for screening for malignant neoplasm of colon: Secondary | ICD-10-CM | POA: Insufficient documentation

## 2023-08-18 DIAGNOSIS — Z860101 Personal history of adenomatous and serrated colon polyps: Secondary | ICD-10-CM | POA: Diagnosis not present

## 2023-08-18 DIAGNOSIS — K648 Other hemorrhoids: Secondary | ICD-10-CM | POA: Diagnosis not present

## 2023-08-18 DIAGNOSIS — Z7984 Long term (current) use of oral hypoglycemic drugs: Secondary | ICD-10-CM | POA: Diagnosis not present

## 2023-08-18 DIAGNOSIS — Z87891 Personal history of nicotine dependence: Secondary | ICD-10-CM | POA: Diagnosis not present

## 2023-08-18 DIAGNOSIS — K640 First degree hemorrhoids: Secondary | ICD-10-CM | POA: Insufficient documentation

## 2023-08-18 DIAGNOSIS — K5730 Diverticulosis of large intestine without perforation or abscess without bleeding: Secondary | ICD-10-CM | POA: Insufficient documentation

## 2023-08-18 DIAGNOSIS — D123 Benign neoplasm of transverse colon: Secondary | ICD-10-CM | POA: Diagnosis not present

## 2023-08-18 HISTORY — PX: COLONOSCOPY WITH PROPOFOL: SHX5780

## 2023-08-18 HISTORY — DX: Lymphedema, not elsewhere classified: I89.0

## 2023-08-18 HISTORY — DX: Abdominal aortic aneurysm, without rupture, unspecified: I71.40

## 2023-08-18 HISTORY — DX: Anemia, unspecified: D64.9

## 2023-08-18 HISTORY — DX: Personal history of adenomatous and serrated colon polyps: Z86.0101

## 2023-08-18 HISTORY — DX: Primary open-angle glaucoma, bilateral, stage unspecified: H40.1130

## 2023-08-18 HISTORY — DX: Celiac artery compression syndrome: I77.4

## 2023-08-18 HISTORY — PX: POLYPECTOMY: SHX5525

## 2023-08-18 HISTORY — DX: Diverticulosis of large intestine without perforation or abscess without bleeding: K57.30

## 2023-08-18 HISTORY — DX: Personal history of other diseases of the digestive system: Z87.19

## 2023-08-18 HISTORY — DX: Cystoid macular degeneration, unspecified eye: H35.359

## 2023-08-18 HISTORY — DX: Retinal neovascularization, unspecified, unspecified eye: H35.059

## 2023-08-18 HISTORY — DX: Hypertensive retinopathy, bilateral: H35.033

## 2023-08-18 LAB — GLUCOSE, CAPILLARY: Glucose-Capillary: 103 mg/dL — ABNORMAL HIGH (ref 70–99)

## 2023-08-18 SURGERY — COLONOSCOPY WITH PROPOFOL
Anesthesia: General

## 2023-08-18 MED ORDER — LIDOCAINE HCL (CARDIAC) PF 100 MG/5ML IV SOSY
PREFILLED_SYRINGE | INTRAVENOUS | Status: DC | PRN
  Administered 2023-08-18: 50 mg via INTRAVENOUS

## 2023-08-18 MED ORDER — SODIUM CHLORIDE 0.9 % IV SOLN: INTRAVENOUS | Status: DC

## 2023-08-18 MED ORDER — PROPOFOL 10 MG/ML IV BOLUS
INTRAVENOUS | Status: DC | PRN
  Administered 2023-08-18 (×2): 40 mg via INTRAVENOUS
  Administered 2023-08-18: 100 mg via INTRAVENOUS

## 2023-08-18 NOTE — Transfer of Care (Signed)
Immediate Anesthesia Transfer of Care Note  Patient: Rose Perry  Procedure(s) Performed: COLONOSCOPY WITH PROPOFOL POLYPECTOMY  Patient Location: Endoscopy Unit  Anesthesia Type:General  Level of Consciousness: awake, alert , and oriented  Airway & Oxygen Therapy: Patient Spontanous Breathing  Post-op Assessment: Report given to RN, Post -op Vital signs reviewed and stable, and Patient moving all extremities  Post vital signs: Reviewed and stable  Last Vitals:  Vitals Value Taken Time  BP 117/55 08/18/23 1402  Temp 35.7 C 08/18/23 1401  Pulse 74 08/18/23 1403  Resp 15 08/18/23 1403  SpO2 100 % 08/18/23 1403  Vitals shown include unfiled device data.  Last Pain:  Vitals:   08/18/23 1401  TempSrc: Temporal  PainSc:          Complications: No notable events documented.

## 2023-08-18 NOTE — Op Note (Signed)
[facility]astroenterology Patient Name: Rose Perry Procedure Date: 08/18/2023 11:58 AM MRN: [id number] Account #: [id number] Date of Birth: [birth year] Admit Type: Outpatient Age: 77 Room: [facility] ENDO ROOM 3 Gender: Female Note Status: Supervisor Override Instrument Name: Peds Colonoscope [id number] Procedure:             Colonoscopy Indications:           Surveillance: History of adenomatous polyps,                         inadequate prep on last exam (<110yr), Diverticulosis of                         the colon Providers:             Eather Colas MD, MD Referring MD:          Marilynne Halsted, MD (Referring MD) Medicines:             Monitored Anesthesia Care Complications:         No immediate complications. Estimated blood loss:                         Minimal. Procedure:             Pre-Anesthesia Assessment:                        - Prior to the procedure, a History and Physical was                         performed, and patient medications and allergies were                         reviewed. The patient is competent. The risks and                         benefits of the procedure and the sedation options and                         risks were discussed with the patient. All questions                         were answered and informed consent was obtained.                         Patient identification and proposed procedure were                         verified by the physician, the nurse, the                         anesthesiologist, the anesthetist and the technician                         in the endoscopy suite. Mental Status Examination:                         alert and oriented. Airway Examination: normal  oropharyngeal airway and neck mobility. Respiratory                         Examination: clear to auscultation. CV Examination:                         normal. Prophylactic Antibiotics: The patient does not                          require prophylactic antibiotics. Prior                         Anticoagulants: The patient has taken no anticoagulant                         or antiplatelet agents. ASA Grade Assessment: III - A                         patient with severe systemic disease. After reviewing                         the risks and benefits, the patient was deemed in                         satisfactory condition to undergo the procedure. The                         anesthesia plan was to use monitored anesthesia care                         (MAC). Immediately prior to administration of                         medications, the patient was re-assessed for adequacy                         to receive sedatives. The heart rate, respiratory                         rate, oxygen saturations, blood pressure, adequacy of                         pulmonary ventilation, and response to care were                         monitored throughout the procedure. The physical                         status of the patient was re-assessed after the                         procedure.                        After obtaining informed consent, the colonoscope was                         passed under direct vision. Throughout the procedure,  the patient's blood pressure, pulse, and oxygen                         saturations were monitored continuously. The                         Colonoscope was introduced through the anus and                         advanced to the the cecum, identified by appendiceal                         orifice and ileocecal valve. The colonoscopy was                         performed without difficulty. The patient tolerated                         the procedure well. The quality of the bowel                         preparation was fair. The ileocecal valve, appendiceal                         orifice, and rectum were photographed. Findings:      The perianal and digital rectal  examinations were normal.      Multiple large-mouthed and small-mouthed diverticula were found in the       sigmoid colon, descending colon, transverse colon, hepatic flexure and       ascending colon.      A 10 mm polyp was found in the hepatic flexure. The polyp was sessile.       The polyp was removed with a cold snare. Resection and retrieval were       complete. Estimated blood loss was minimal.      A 4 mm polyp was found in the transverse colon. The polyp was sessile.       The polyp was removed with a cold snare. Resection and retrieval were       complete. Estimated blood loss was minimal.      Internal hemorrhoids were found during retroflexion. The hemorrhoids       were Grade I (internal hemorrhoids that do not prolapse).      The exam was otherwise without abnormality on direct and retroflexion       views. Impression:            - Preparation of the colon was fair.                        - Diverticulosis in the sigmoid colon, in the                         descending colon, in the transverse colon, at the                         hepatic flexure and in the ascending colon.                        - One 10 mm polyp at the hepatic flexure, removed with  a cold snare. Resected and retrieved.                        - One 4 mm polyp in the transverse colon, removed with                         a cold snare. Resected and retrieved.                        - Internal hemorrhoids.                        - The examination was otherwise normal on direct and                         retroflexion views. Recommendation:        - Discharge patient to home.                        - Resume previous diet.                        - Continue present medications.                        - Await pathology results.                        - Repeat colonoscopy is not recommended due to current                         age (41 years or older) for surveillance.                         - Return to referring physician as previously                         scheduled. Procedure Code(s):     --- Professional ---                        [phone], Colonoscopy, flexible; with removal of                         tumor(s), polyp(s), or other lesion(s) by snare                         technique Diagnosis Code(s):     --- Professional ---                        Z86.010, Personal history of colonic polyps                        K64.0, First degree hemorrhoids                        D12.3, Benign neoplasm of transverse colon (hepatic                         flexure or splenic flexure)  K57.30, Diverticulosis of large intestine without                         perforation or abscess without bleeding CPT copyright 2022 [employer]. All rights reserved. The codes documented in this report are preliminary and upon coder review may  be revised to meet current compliance requirements. Eather Colas MD, MD 08/18/2023 2:02:53 PM Number of Addenda: 0 Note Initiated On: 08/18/2023 11:58 AM Scope Withdrawal Time: 0 hours 6 minutes 0 seconds  Total Procedure Duration: 0 hours 13 minutes 0 seconds  Estimated Blood Loss:  Estimated blood loss was minimal.      [facility]

## 2023-08-18 NOTE — Anesthesia Preprocedure Evaluation (Signed)
Anesthesia Evaluation  Patient identified by MRN, date of birth, ID band Patient awake    Reviewed: Allergy & Precautions, NPO status , Patient's Chart, lab work & pertinent test results  Airway Mallampati: III  TM Distance: >3 FB Neck ROM: full    Dental  (+) Teeth Intact, Missing   Pulmonary neg pulmonary ROS, COPD, Patient abstained from smoking., former smoker   Pulmonary exam normal  + decreased breath sounds      Cardiovascular Exercise Tolerance: Good hypertension, Pt. on medications negative cardio ROS Normal cardiovascular exam Rhythm:Regular Rate:Normal     Neuro/Psych negative neurological ROS  negative psych ROS   GI/Hepatic negative GI ROS, Neg liver ROS,,,  Endo/Other  negative endocrine ROSdiabetes, Type 2, Oral Hypoglycemic Agents    Renal/GU negative Renal ROS  negative genitourinary   Musculoskeletal  (+) Arthritis ,    Abdominal  (+) + obese  Peds negative pediatric ROS (+)  Hematology negative hematology ROS (+) Blood dyscrasia, anemia   Anesthesia Other Findings Past Medical History: No date: Abdominal aortic aneurysm (AAA) (HCC) No date: Anemia No date: Arthritis No date: Celiac artery stenosis (HCC) No date: CME (cystoid macular edema)     Comment:  right No date: CNVM (choroidal neovascular membrane) No date: Diabetes mellitus without complication (HCC) No date: Diverticulosis of colon No date: Elevated lipids No date: History of cataract No date: History of rectal bleeding No date: Hx of adenomatous colonic polyps No date: Hyperlipidemia No date: Hypertension No date: Hypertensive retinopathy of both eyes No date: Lymphedema No date: Primary open angle glaucoma (POAG) of both eyes  Past Surgical History: No date: ABDOMINAL HYSTERECTOMY No date: COLONOSCOPY 08/22/2016: COLONOSCOPY WITH PROPOFOL; N/A     Comment:  Procedure: COLONOSCOPY WITH PROPOFOL;  Surgeon: Scot Jun, MD;  Location: [facility] ENDOSCOPY;  Service:               Endoscopy;  Laterality: N/A; 08/11/2020: COLONOSCOPY WITH PROPOFOL; N/A     Comment:  Procedure: COLONOSCOPY WITH PROPOFOL;  Surgeon:               Regis Bill, MD;  Location: [facility] ENDOSCOPY;                Service: Endoscopy;  Laterality: N/A; 11/12/2021: COLONOSCOPY WITH PROPOFOL; N/A     Comment:  Procedure: COLONOSCOPY WITH PROPOFOL;  Surgeon:               Regis Bill, MD;  Location: [facility] ENDOSCOPY;                Service: Endoscopy;  Laterality: N/A;  DM 08/11/2020: ESOPHAGOGASTRODUODENOSCOPY (EGD) WITH PROPOFOL; N/A     Comment:  Procedure: ESOPHAGOGASTRODUODENOSCOPY (EGD) WITH               PROPOFOL;  Surgeon: Regis Bill, MD;  Location:               [facility] ENDOSCOPY;  Service: Endoscopy;  Laterality: N/A; No date: EYE SURGERY No date: JOINT REPLACEMENT; Left     Comment:  left total knee replacement No date: ltkr No date: needle in foot removed No date: REFRACTIVE SURGERY  BMI    Body Mass Index: 35.40 kg/m      Reproductive/Obstetrics negative OB ROS  Anesthesia Physical Anesthesia Plan  ASA: 3  Anesthesia Plan: General   Post-op Pain Management:    Induction: Intravenous  PONV Risk Score and Plan: Propofol infusion and TIVA  Airway Management Planned: Natural Airway and Nasal Cannula  Additional Equipment:   Intra-op Plan:   Post-operative Plan:   Informed Consent: I have reviewed the patients History and Physical, chart, labs and discussed the procedure including the risks, benefits and alternatives for the proposed anesthesia with the patient or authorized representative who has indicated his/her understanding and acceptance.     Dental Advisory Given  Plan Discussed with: CRNA  Anesthesia Plan Comments:        Anesthesia Quick Evaluation

## 2023-08-18 NOTE — H&P (Signed)
Outpatient short stay form Pre-procedure 08/18/2023  Regis Bill, MD  Primary Physician: Patrice Paradise, MD  Reason for visit:  Surveillance  History of present illness:    77 y/o lady with history of diverticular bleed, hypertension, DM II, and HLD here for colonoscopy for history of polyps. No blood thinners. No abdominal surgeries. Last colonoscopy in 2023 with inadequate prep. No family history of GI malignancies.     Current Facility-Administered Medications:    0.9 %  sodium chloride infusion, , Intravenous, Continuous, Deneisha Dade, Rossie Muskrat, MD  Medications Prior to Admission  Medication Sig Dispense Refill Last Dose   amLODipine (NORVASC) 5 MG tablet Take 1 tablet by mouth daily.   08/18/2023 at 0600   atorvastatin (LIPITOR) 20 MG tablet Take 20 mg by mouth daily.   08/18/2023 at 0600   metFORMIN (GLUCOPHAGE) 500 MG tablet Take 1 tablet by mouth 2 (two) times daily with a meal.   08/18/2023 at 0600   metoprolol tartrate (LOPRESSOR) 25 MG tablet Take 0.5 tablets (12.5 mg total) by mouth 2 (two) times daily. 30 tablet 0 08/18/2023 at 0600   Multiple Vitamins-Minerals (SENTRY PO) Take by mouth daily.   08/18/2023 at 0600   polyethylene glycol (MIRALAX) 17 g packet Take 17 g by mouth 2 (two) times daily. 60 each 0 Past Week   potassium chloride (KLOR-CON) 10 MEQ tablet Take 10 mEq by mouth daily.   08/18/2023 at 0600   timolol (TIMOPTIC) 0.5 % ophthalmic solution 1 drop 2 (two) times daily.   08/17/2023   atorvastatin (LIPITOR) 20 MG tablet Take 1 tablet by mouth daily.      pantoprazole (PROTONIX) 40 MG tablet Take 1 tablet (40 mg total) by mouth daily. 30 tablet 0    polyethylene glycol powder (GLYCOLAX/MIRALAX) 17 GM/SCOOP powder Take by mouth. (Patient not taking: Reported on 08/18/2023)   Completed Course     Allergies  Allergen Reactions   Codeine      Past Medical History:  Diagnosis Date   Abdominal aortic aneurysm (AAA) (HCC)    Anemia    Arthritis    Celiac  artery stenosis (HCC)    CME (cystoid macular edema)    right   CNVM (choroidal neovascular membrane)    Diabetes mellitus without complication (HCC)    Diverticulosis of colon    Elevated lipids    History of cataract    History of rectal bleeding    Hx of adenomatous colonic polyps    Hyperlipidemia    Hypertension    Hypertensive retinopathy of both eyes    Lymphedema    Primary open angle glaucoma (POAG) of both eyes     Review of systems:  Otherwise negative.    Physical Exam  Gen: Alert, oriented. Appears stated age.  HEENT: PERRLA. Lungs: No respiratory distress CV: RRR Abd: soft, benign, no masses Ext: No edema    Planned procedures: Proceed with colonoscopy. The patient understands the nature of the planned procedure, indications, risks, alternatives and potential complications including but not limited to bleeding, infection, perforation, damage to internal organs and possible oversedation/side effects from anesthesia. The patient agrees and gives consent to proceed.  Please refer to procedure notes for findings, recommendations and patient disposition/instructions.     Regis Bill, MD [facility] Gastroenterology

## 2023-08-18 NOTE — Interval H&P Note (Signed)
History and Physical Interval Note:  08/18/2023 1:31 PM  Rose Perry  has presented today for surgery, with the diagnosis of h/o TA Polyps.  The various methods of treatment have been discussed with the patient and family. After consideration of risks, benefits and other options for treatment, the patient has consented to  Procedure(s): COLONOSCOPY WITH PROPOFOL (N/A) as a surgical intervention.  The patient's history has been reviewed, patient examined, no change in status, stable for surgery.  I have reviewed the patient's chart and labs.  Questions were answered to the patient's satisfaction.     Regis Bill  Ok to proceed with colonoscopy

## 2023-08-18 NOTE — Anesthesia Postprocedure Evaluation (Signed)
Anesthesia Post Note  Patient: Rose Perry  Procedure(s) Performed: COLONOSCOPY WITH PROPOFOL POLYPECTOMY  Patient location during evaluation: PACU Anesthesia Type: General Level of consciousness: awake Pain management: pain level controlled Vital Signs Assessment: post-procedure vital signs reviewed and stable Respiratory status: spontaneous breathing Cardiovascular status: stable Anesthetic complications: no   No notable events documented.   Last Vitals:  Vitals:   08/18/23 1220 08/18/23 1401  BP: (!) 156/61 (!) 117/55  Pulse: 73   Resp: 16 14  Temp: (!) 36.1 C (!) 35.7 C  SpO2: 96% 99%    Last Pain:  Vitals:   08/18/23 1401  TempSrc: Temporal  PainSc: 0-No pain                 VAN STAVEREN,Latoia Eyster

## 2023-08-22 LAB — SURGICAL PATHOLOGY

## 2024-01-12 DIAGNOSIS — Z87891 Personal history of nicotine dependence: Secondary | ICD-10-CM | POA: Diagnosis not present

## 2024-01-12 DIAGNOSIS — S90424A Blister (nonthermal), right lesser toe(s), initial encounter: Secondary | ICD-10-CM | POA: Diagnosis not present

## 2024-01-12 DIAGNOSIS — L02611 Cutaneous abscess of right foot: Secondary | ICD-10-CM | POA: Diagnosis not present

## 2024-01-12 DIAGNOSIS — E1142 Type 2 diabetes mellitus with diabetic polyneuropathy: Secondary | ICD-10-CM | POA: Diagnosis not present

## 2024-01-12 DIAGNOSIS — I739 Peripheral vascular disease, unspecified: Secondary | ICD-10-CM | POA: Diagnosis not present

## 2024-01-12 DIAGNOSIS — M79671 Pain in right foot: Secondary | ICD-10-CM | POA: Diagnosis not present

## 2024-01-12 DIAGNOSIS — E119 Type 2 diabetes mellitus without complications: Secondary | ICD-10-CM | POA: Diagnosis not present

## 2024-01-15 DIAGNOSIS — E119 Type 2 diabetes mellitus without complications: Secondary | ICD-10-CM | POA: Diagnosis not present

## 2024-01-15 DIAGNOSIS — E782 Mixed hyperlipidemia: Secondary | ICD-10-CM | POA: Diagnosis not present

## 2024-01-15 DIAGNOSIS — I10 Essential (primary) hypertension: Secondary | ICD-10-CM | POA: Diagnosis not present

## 2024-01-22 DIAGNOSIS — B351 Tinea unguium: Secondary | ICD-10-CM | POA: Diagnosis not present

## 2024-01-22 DIAGNOSIS — E782 Mixed hyperlipidemia: Secondary | ICD-10-CM | POA: Diagnosis not present

## 2024-01-22 DIAGNOSIS — M79671 Pain in right foot: Secondary | ICD-10-CM | POA: Diagnosis not present

## 2024-01-22 DIAGNOSIS — Z6835 Body mass index (BMI) 35.0-35.9, adult: Secondary | ICD-10-CM | POA: Diagnosis not present

## 2024-01-22 DIAGNOSIS — L858 Other specified epidermal thickening: Secondary | ICD-10-CM | POA: Diagnosis not present

## 2024-01-22 DIAGNOSIS — E1142 Type 2 diabetes mellitus with diabetic polyneuropathy: Secondary | ICD-10-CM | POA: Diagnosis not present

## 2024-01-22 DIAGNOSIS — H35051 Retinal neovascularization, unspecified, right eye: Secondary | ICD-10-CM | POA: Diagnosis not present

## 2024-01-22 DIAGNOSIS — Z0000 Encounter for general adult medical examination without abnormal findings: Secondary | ICD-10-CM | POA: Diagnosis not present

## 2024-01-22 DIAGNOSIS — I739 Peripheral vascular disease, unspecified: Secondary | ICD-10-CM | POA: Diagnosis not present

## 2024-01-22 DIAGNOSIS — I10 Essential (primary) hypertension: Secondary | ICD-10-CM | POA: Diagnosis not present

## 2024-01-22 DIAGNOSIS — H35351 Cystoid macular degeneration, right eye: Secondary | ICD-10-CM | POA: Diagnosis not present

## 2024-01-22 DIAGNOSIS — E119 Type 2 diabetes mellitus without complications: Secondary | ICD-10-CM | POA: Diagnosis not present

## 2024-01-22 DIAGNOSIS — Z87891 Personal history of nicotine dependence: Secondary | ICD-10-CM | POA: Diagnosis not present

## 2024-01-22 DIAGNOSIS — H35033 Hypertensive retinopathy, bilateral: Secondary | ICD-10-CM | POA: Diagnosis not present

## 2024-01-22 DIAGNOSIS — S90424D Blister (nonthermal), right lesser toe(s), subsequent encounter: Secondary | ICD-10-CM | POA: Diagnosis not present

## 2024-01-24 ENCOUNTER — Other Ambulatory Visit (INDEPENDENT_AMBULATORY_CARE_PROVIDER_SITE_OTHER): Payer: Self-pay | Admitting: Podiatry

## 2024-01-24 DIAGNOSIS — I739 Peripheral vascular disease, unspecified: Secondary | ICD-10-CM

## 2024-01-25 ENCOUNTER — Ambulatory Visit (INDEPENDENT_AMBULATORY_CARE_PROVIDER_SITE_OTHER)

## 2024-01-25 DIAGNOSIS — I739 Peripheral vascular disease, unspecified: Secondary | ICD-10-CM | POA: Diagnosis not present

## 2024-01-26 ENCOUNTER — Ambulatory Visit (INDEPENDENT_AMBULATORY_CARE_PROVIDER_SITE_OTHER): Admitting: Vascular Surgery

## 2024-01-26 ENCOUNTER — Encounter (INDEPENDENT_AMBULATORY_CARE_PROVIDER_SITE_OTHER): Payer: Self-pay | Admitting: Vascular Surgery

## 2024-01-26 VITALS — BP 128/79 | HR 79 | Resp 18 | Ht 67.0 in | Wt 224.0 lb

## 2024-01-26 DIAGNOSIS — I10 Essential (primary) hypertension: Secondary | ICD-10-CM | POA: Diagnosis not present

## 2024-01-26 DIAGNOSIS — I70218 Atherosclerosis of native arteries of extremities with intermittent claudication, other extremity: Secondary | ICD-10-CM

## 2024-01-26 DIAGNOSIS — E113292 Type 2 diabetes mellitus with mild nonproliferative diabetic retinopathy without macular edema, left eye: Secondary | ICD-10-CM

## 2024-01-26 NOTE — H&P (View-Only) (Signed)
 Subjective:    Patient ID: Rose Perry, female    DOB: 12/06/1945, 78 y.o.   MRN: [id number] Chief Complaint  Patient presents with   Follow-up    fu abnormal ABI    Rose Perry is a 78 yo female who presents to clinic today with chief complaint of right lower extremity pain with ulceration to right fifth digit toe.  Patient underwent bilateral lower extremity arterial ultrasounds with ABIs on 01/24/2024.  She returns to clinic today to discuss those findings.  Patient was notified that today's ultrasounds reveal left ABI previous = 1.16.  Left ABI today = 1.09 with Right ABI previous = 1.36  Right ABI Today = 0.64 patient endorses she has right lower extremity pain at rest as well as ambulation.  Pain worsens the longer she walks.  She is noted to have right ankle and foot swelling as well.  Patient endorses this started about 6 months ago.    Review of Systems  Constitutional: Negative.   Respiratory: Negative.    Cardiovascular: Negative.   Gastrointestinal: Negative.   Endocrine: Negative.   Genitourinary: Negative.   Musculoskeletal: Negative.   Skin:  Positive for wound.       Ulceration right fifth digit on right foot with story of gangrene  Neurological: Negative.   Psychiatric/Behavioral: Negative.    All other systems reviewed and are negative.      Objective:    Physical Exam Vitals reviewed.  Constitutional:      Appearance: Normal appearance. She is obese.  HENT:     Head: Normocephalic.  Eyes:     Pupils: Pupils are equal, round, and reactive to light.  Cardiovascular:     Rate and Rhythm: Normal rate and regular rhythm.     Pulses: Normal pulses.     Heart sounds: Normal heart sounds.  Pulmonary:     Effort: Pulmonary effort is normal.     Breath sounds: Normal breath sounds.  Abdominal:     General: Abdomen is flat. Bowel sounds are normal.     Palpations: Abdomen is soft.  Musculoskeletal:     Cervical back: Normal range of motion.     Right  lower leg: Edema present.  Skin:    General: Skin is warm and dry.     Capillary Refill: Capillary refill takes more than 3 seconds. Capillary refill slow on the right side only Neurological:     General: No focal deficit present.     Mental Status: She is alert and oriented to person, place, and time. Mental status is at baseline.  Psychiatric:        Mood and Affect: Mood normal.        Behavior: Behavior normal.        Thought Content: Thought content normal.        Judgment: Judgment normal.     BP 128/79   Pulse 79   Resp 18   Ht 5\' 7"  (1.702 m)   Wt 224 lb (101.6 kg)   BMI 35.08 kg/m   Past Medical History:  Diagnosis Date   Abdominal aortic aneurysm (AAA) (HCC)    Anemia    Arthritis    Celiac artery stenosis (HCC)    CME (cystoid macular edema)    right   CNVM (choroidal neovascular membrane)    Diabetes mellitus without complication (HCC)    Diverticulosis of colon    Elevated lipids    History of cataract    History of  rectal bleeding    Hx of adenomatous colonic polyps    Hyperlipidemia    Hypertension    Hypertensive retinopathy of both eyes    Lymphedema    Primary open angle glaucoma (POAG) of both eyes     Social History   Socioeconomic History   Marital status: Divorced    Spouse name: Not on file   Number of children: Not on file   Years of education: Not on file   Highest education level: Not on file  Occupational History   Not on file  Tobacco Use   Smoking status: Former   Smokeless tobacco: Never  Vaping Use   Vaping status: Never Used  Substance and Sexual Activity   Alcohol use: No   Drug use: No   Sexual activity: Not Currently  Other Topics Concern   Not on file  Social History Narrative   Not on file   Social Drivers of Health   Financial Resource Strain: Low Risk  (01/18/2023)   Received from [facility], [facility]   Overall Financial Resource Strain (CARDIA)    Difficulty of  Paying Living Expenses: Not hard at all  Food Insecurity: No Food Insecurity (01/18/2023)   Received from [facility], [facility]   Hunger Vital Sign    Worried About Running Out of Food in the Last Year: Never true    Ran Out of Food in the Last Year: Never true  Transportation Needs: No Transportation Needs (01/18/2023)   Received from [facility], [facility]   [facility] - Transportation    In the past 12 months, has lack of transportation kept you from medical appointments or from getting medications?: No    Lack of Transportation (Non-Medical): No  Physical Activity: Not on file  Stress: Not on file  Social Connections: Not on file  Intimate Partner Violence: Not on file    Past Surgical History:  Procedure Laterality Date   ABDOMINAL HYSTERECTOMY     COLONOSCOPY     COLONOSCOPY WITH PROPOFOL  N/A 08/22/2016   Procedure: COLONOSCOPY WITH PROPOFOL ;  Surgeon: Cassie Click, MD;  Location: [facility] ENDOSCOPY;  Service: Endoscopy;  Laterality: N/A;   COLONOSCOPY WITH PROPOFOL  N/A 08/11/2020   Procedure: COLONOSCOPY WITH PROPOFOL ;  Surgeon: Shane Darling, MD;  Location: [facility] ENDOSCOPY;  Service: Endoscopy;  Laterality: N/A;   COLONOSCOPY WITH PROPOFOL  N/A 11/12/2021   Procedure: COLONOSCOPY WITH PROPOFOL ;  Surgeon: Shane Darling, MD;  Location: [facility] ENDOSCOPY;  Service: Endoscopy;  Laterality: N/A;  DM   COLONOSCOPY WITH PROPOFOL  N/A 08/18/2023   Procedure: COLONOSCOPY WITH PROPOFOL ;  Surgeon: Shane Darling, MD;  Location: [facility] ENDOSCOPY;  Service: Endoscopy;  Laterality: N/A;   ESOPHAGOGASTRODUODENOSCOPY (EGD) WITH PROPOFOL  N/A 08/11/2020   Procedure: ESOPHAGOGASTRODUODENOSCOPY (EGD) WITH PROPOFOL ;  Surgeon: Shane Darling, MD;  Location: [facility] ENDOSCOPY;  Service: Endoscopy;  Laterality: N/A;   EYE SURGERY     JOINT REPLACEMENT Left    left total knee replacement   ltkr     needle in foot  removed     POLYPECTOMY  08/18/2023   Procedure: POLYPECTOMY;  Surgeon: Shane Darling, MD;  Location: [facility] ENDOSCOPY;  Service: Endoscopy;;   REFRACTIVE SURGERY      Family History  Problem Relation Age of Onset   Hypertension Mother    Hyperlipidemia Mother    Hypertension Father    Coronary artery disease Brother    Breast  cancer Neg Hx     Allergies  Allergen Reactions   Codeine        Latest Ref Rng & Units 08/14/2020    7:18 AM 08/13/2020    5:27 AM 08/12/2020   12:11 PM  CBC  WBC 4.0 - 10.5 K/uL  9.1    Hemoglobin 12.0 - 15.0 g/dL 8.5  8.4  8.2   Hematocrit 36.0 - 46.0 %  26.8  26.1   Platelets 150 - 400 K/uL  205         CMP     Component Value Date/Time   NA 143 08/12/2020 0619   NA 137 08/23/2013 0519   K 3.5 08/12/2020 0619   K 3.4 (L) 08/23/2013 0519   CL 111 08/12/2020 0619   CL 103 08/23/2013 0519   CO2 26 08/12/2020 0619   CO2 30 08/23/2013 0519   GLUCOSE 108 (H) 08/12/2020 0619   GLUCOSE 168 (H) 08/23/2013 0519   BUN 9 08/12/2020 0619   BUN 12 08/23/2013 0519   CREATININE 1.03 (H) 08/12/2020 0619   CREATININE 1.17 08/23/2013 0519   CALCIUM 7.7 (L) 08/12/2020 0619   CALCIUM 8.2 (L) 08/23/2013 0519   PROT 4.8 (L) 08/12/2020 0619   ALBUMIN 2.5 (L) 08/12/2020 0619   AST 17 08/12/2020 0619   ALT 10 08/12/2020 0619   ALKPHOS 40 08/12/2020 0619   BILITOT 0.6 08/12/2020 0619   GFRNONAA 57 (L) 08/12/2020 0619   GFRNONAA 48 (L) 08/23/2013 0519     No results found.     Assessment & Plan:   1. Claudication of right upper extremity due to atherosclerosis (HCC) (Primary)  Please Call Patients Grand daughter to schedule Angiogram [phone]   Recommend:  The patient has experienced increased claudication symptoms and is now describing lifestyle limiting claudication and appears to be having mild rest pain symptroms.  Given the severity of the patient's severe right lower extremity symptoms the patient should undergo angiography with  the hope for intervention.  Risk and benefits were reviewed the patient.  Indications for the procedure were reviewed.  All questions were answered, the patient agrees to proceed with right lower extremity angiography and possible intervention.   The patient should continue walking and begin a more formal exercise program.  The patient should continue antiplatelet therapy and aggressive treatment of the lipid abnormalities  The patient will follow up with me after the angiogram.  2. Hypertension, unspecified type Continue antihypertensive medications as already ordered, these medications have been reviewed and there are no changes at this time.   3. 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  Hgb A1C to be monitored as already arranged by primary service   Current Outpatient Medications on File Prior to Visit  Medication Sig Dispense Refill   amLODipine (NORVASC) 5 MG tablet Take 1 tablet by mouth daily.     atorvastatin (LIPITOR) 20 MG tablet Take 20 mg by mouth daily.     atorvastatin (LIPITOR) 20 MG tablet Take 1 tablet by mouth daily.     metFORMIN (GLUCOPHAGE) 500 MG tablet Take 1 tablet by mouth 2 (two) times daily with a meal.     metoprolol  tartrate (LOPRESSOR ) 25 MG tablet Take 0.5 tablets (12.5 mg total) by mouth 2 (two) times daily. 30 tablet 0  Multiple Vitamins-Minerals (SENTRY PO) Take by mouth daily.     pantoprazole  (PROTONIX ) 40 MG tablet Take 1 tablet (40 mg total) by mouth daily. 30 tablet 0   polyethylene glycol (MIRALAX ) 17 g packet Take 17 g by mouth 2 (two) times daily. 60 each 0   potassium chloride  (KLOR-CON ) 10 MEQ tablet Take 10 mEq by mouth daily.     timolol (TIMOPTIC) 0.5 % ophthalmic solution 1 drop 2 (two) times daily.      triamterene-hydrochlorothiazide (MAXZIDE-25) 37.5-25 MG tablet Take 1 tablet by mouth daily.     polyethylene glycol powder (GLYCOLAX /MIRALAX ) 17 GM/SCOOP powder Take by mouth. (Patient not taking: Reported on 08/18/2023)     No current facility-administered medications on file prior to visit.    There are no Patient Instructions on file for this visit. No follow-ups on file.   Annamaria Barrette, NP

## 2024-01-26 NOTE — Progress Notes (Signed)
 Subjective:    Patient ID: Rose Perry, female    DOB: 12/06/1945, 78 y.o.   MRN: [id number] Chief Complaint  Patient presents with   Follow-up    fu abnormal ABI    Rose Perry is a 78 yo female who presents to clinic today with chief complaint of right lower extremity pain with ulceration to right fifth digit toe.  Patient underwent bilateral lower extremity arterial ultrasounds with ABIs on 01/24/2024.  She returns to clinic today to discuss those findings.  Patient was notified that today's ultrasounds reveal left ABI previous = 1.16.  Left ABI today = 1.09 with Right ABI previous = 1.36  Right ABI Today = 0.64 patient endorses she has right lower extremity pain at rest as well as ambulation.  Pain worsens the longer she walks.  She is noted to have right ankle and foot swelling as well.  Patient endorses this started about 6 months ago.    Review of Systems  Constitutional: Negative.   Respiratory: Negative.    Cardiovascular: Negative.   Gastrointestinal: Negative.   Endocrine: Negative.   Genitourinary: Negative.   Musculoskeletal: Negative.   Skin:  Positive for wound.       Ulceration right fifth digit on right foot with story of gangrene  Neurological: Negative.   Psychiatric/Behavioral: Negative.    All other systems reviewed and are negative.      Objective:    Physical Exam Vitals reviewed.  Constitutional:      Appearance: Normal appearance. She is obese.  HENT:     Head: Normocephalic.  Eyes:     Pupils: Pupils are equal, round, and reactive to light.  Cardiovascular:     Rate and Rhythm: Normal rate and regular rhythm.     Pulses: Normal pulses.     Heart sounds: Normal heart sounds.  Pulmonary:     Effort: Pulmonary effort is normal.     Breath sounds: Normal breath sounds.  Abdominal:     General: Abdomen is flat. Bowel sounds are normal.     Palpations: Abdomen is soft.  Musculoskeletal:     Cervical back: Normal range of motion.     Right  lower leg: Edema present.  Skin:    General: Skin is warm and dry.     Capillary Refill: Capillary refill takes more than 3 seconds. Capillary refill slow on the right side only Neurological:     General: No focal deficit present.     Mental Status: She is alert and oriented to person, place, and time. Mental status is at baseline.  Psychiatric:        Mood and Affect: Mood normal.        Behavior: Behavior normal.        Thought Content: Thought content normal.        Judgment: Judgment normal.     BP 128/79   Pulse 79   Resp 18   Ht 5\' 7"  (1.702 m)   Wt 224 lb (101.6 kg)   BMI 35.08 kg/m   Past Medical History:  Diagnosis Date   Abdominal aortic aneurysm (AAA) (HCC)    Anemia    Arthritis    Celiac artery stenosis (HCC)    CME (cystoid macular edema)    right   CNVM (choroidal neovascular membrane)    Diabetes mellitus without complication (HCC)    Diverticulosis of colon    Elevated lipids    History of cataract    History of  rectal bleeding    Hx of adenomatous colonic polyps    Hyperlipidemia    Hypertension    Hypertensive retinopathy of both eyes    Lymphedema    Primary open angle glaucoma (POAG) of both eyes     Social History   Socioeconomic History   Marital status: Divorced    Spouse name: Not on file   Number of children: Not on file   Years of education: Not on file   Highest education level: Not on file  Occupational History   Not on file  Tobacco Use   Smoking status: Former   Smokeless tobacco: Never  Vaping Use   Vaping status: Never Used  Substance and Sexual Activity   Alcohol use: No   Drug use: No   Sexual activity: Not Currently  Other Topics Concern   Not on file  Social History Narrative   Not on file   Social Drivers of Health   Financial Resource Strain: Low Risk  (01/18/2023)   Received from [facility], [facility]   Overall Financial Resource Strain (CARDIA)    Difficulty of  Paying Living Expenses: Not hard at all  Food Insecurity: No Food Insecurity (01/18/2023)   Received from [facility], [facility]   Hunger Vital Sign    Worried About Running Out of Food in the Last Year: Never true    Ran Out of Food in the Last Year: Never true  Transportation Needs: No Transportation Needs (01/18/2023)   Received from [facility], [facility]   [facility] - Transportation    In the past 12 months, has lack of transportation kept you from medical appointments or from getting medications?: No    Lack of Transportation (Non-Medical): No  Physical Activity: Not on file  Stress: Not on file  Social Connections: Not on file  Intimate Partner Violence: Not on file    Past Surgical History:  Procedure Laterality Date   ABDOMINAL HYSTERECTOMY     COLONOSCOPY     COLONOSCOPY WITH PROPOFOL  N/A 08/22/2016   Procedure: COLONOSCOPY WITH PROPOFOL ;  Surgeon: Cassie Click, MD;  Location: [facility] ENDOSCOPY;  Service: Endoscopy;  Laterality: N/A;   COLONOSCOPY WITH PROPOFOL  N/A 08/11/2020   Procedure: COLONOSCOPY WITH PROPOFOL ;  Surgeon: Shane Darling, MD;  Location: [facility] ENDOSCOPY;  Service: Endoscopy;  Laterality: N/A;   COLONOSCOPY WITH PROPOFOL  N/A 11/12/2021   Procedure: COLONOSCOPY WITH PROPOFOL ;  Surgeon: Shane Darling, MD;  Location: [facility] ENDOSCOPY;  Service: Endoscopy;  Laterality: N/A;  DM   COLONOSCOPY WITH PROPOFOL  N/A 08/18/2023   Procedure: COLONOSCOPY WITH PROPOFOL ;  Surgeon: Shane Darling, MD;  Location: [facility] ENDOSCOPY;  Service: Endoscopy;  Laterality: N/A;   ESOPHAGOGASTRODUODENOSCOPY (EGD) WITH PROPOFOL  N/A 08/11/2020   Procedure: ESOPHAGOGASTRODUODENOSCOPY (EGD) WITH PROPOFOL ;  Surgeon: Shane Darling, MD;  Location: [facility] ENDOSCOPY;  Service: Endoscopy;  Laterality: N/A;   EYE SURGERY     JOINT REPLACEMENT Left    left total knee replacement   ltkr     needle in foot  removed     POLYPECTOMY  08/18/2023   Procedure: POLYPECTOMY;  Surgeon: Shane Darling, MD;  Location: [facility] ENDOSCOPY;  Service: Endoscopy;;   REFRACTIVE SURGERY      Family History  Problem Relation Age of Onset   Hypertension Mother    Hyperlipidemia Mother    Hypertension Father    Coronary artery disease Brother    Breast  cancer Neg Hx     Allergies  Allergen Reactions   Codeine        Latest Ref Rng & Units 08/14/2020    7:18 AM 08/13/2020    5:27 AM 08/12/2020   12:11 PM  CBC  WBC 4.0 - 10.5 K/uL  9.1    Hemoglobin 12.0 - 15.0 g/dL 8.5  8.4  8.2   Hematocrit 36.0 - 46.0 %  26.8  26.1   Platelets 150 - 400 K/uL  205         CMP     Component Value Date/Time   NA 143 08/12/2020 0619   NA 137 08/23/2013 0519   K 3.5 08/12/2020 0619   K 3.4 (L) 08/23/2013 0519   CL 111 08/12/2020 0619   CL 103 08/23/2013 0519   CO2 26 08/12/2020 0619   CO2 30 08/23/2013 0519   GLUCOSE 108 (H) 08/12/2020 0619   GLUCOSE 168 (H) 08/23/2013 0519   BUN 9 08/12/2020 0619   BUN 12 08/23/2013 0519   CREATININE 1.03 (H) 08/12/2020 0619   CREATININE 1.17 08/23/2013 0519   CALCIUM 7.7 (L) 08/12/2020 0619   CALCIUM 8.2 (L) 08/23/2013 0519   PROT 4.8 (L) 08/12/2020 0619   ALBUMIN 2.5 (L) 08/12/2020 0619   AST 17 08/12/2020 0619   ALT 10 08/12/2020 0619   ALKPHOS 40 08/12/2020 0619   BILITOT 0.6 08/12/2020 0619   GFRNONAA 57 (L) 08/12/2020 0619   GFRNONAA 48 (L) 08/23/2013 0519     No results found.     Assessment & Plan:   1. Claudication of right upper extremity due to atherosclerosis (HCC) (Primary)  Please Call Patients Grand daughter to schedule Angiogram [phone]   Recommend:  The patient has experienced increased claudication symptoms and is now describing lifestyle limiting claudication and appears to be having mild rest pain symptroms.  Given the severity of the patient's severe right lower extremity symptoms the patient should undergo angiography with  the hope for intervention.  Risk and benefits were reviewed the patient.  Indications for the procedure were reviewed.  All questions were answered, the patient agrees to proceed with right lower extremity angiography and possible intervention.   The patient should continue walking and begin a more formal exercise program.  The patient should continue antiplatelet therapy and aggressive treatment of the lipid abnormalities  The patient will follow up with me after the angiogram.  2. Hypertension, unspecified type Continue antihypertensive medications as already ordered, these medications have been reviewed and there are no changes at this time.   3. 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  Hgb A1C to be monitored as already arranged by primary service   Current Outpatient Medications on File Prior to Visit  Medication Sig Dispense Refill   amLODipine (NORVASC) 5 MG tablet Take 1 tablet by mouth daily.     atorvastatin (LIPITOR) 20 MG tablet Take 20 mg by mouth daily.     atorvastatin (LIPITOR) 20 MG tablet Take 1 tablet by mouth daily.     metFORMIN (GLUCOPHAGE) 500 MG tablet Take 1 tablet by mouth 2 (two) times daily with a meal.     metoprolol  tartrate (LOPRESSOR ) 25 MG tablet Take 0.5 tablets (12.5 mg total) by mouth 2 (two) times daily. 30 tablet 0  Multiple Vitamins-Minerals (SENTRY PO) Take by mouth daily.     pantoprazole  (PROTONIX ) 40 MG tablet Take 1 tablet (40 mg total) by mouth daily. 30 tablet 0   polyethylene glycol (MIRALAX ) 17 g packet Take 17 g by mouth 2 (two) times daily. 60 each 0   potassium chloride  (KLOR-CON ) 10 MEQ tablet Take 10 mEq by mouth daily.     timolol (TIMOPTIC) 0.5 % ophthalmic solution 1 drop 2 (two) times daily.      triamterene-hydrochlorothiazide (MAXZIDE-25) 37.5-25 MG tablet Take 1 tablet by mouth daily.     polyethylene glycol powder (GLYCOLAX /MIRALAX ) 17 GM/SCOOP powder Take by mouth. (Patient not taking: Reported on 08/18/2023)     No current facility-administered medications on file prior to visit.    There are no Patient Instructions on file for this visit. No follow-ups on file.   Annamaria Barrette, NP

## 2024-01-29 LAB — VAS US ABI WITH/WO TBI
Left ABI: 1.09
Right ABI: 0.64

## 2024-01-31 ENCOUNTER — Telehealth (INDEPENDENT_AMBULATORY_CARE_PROVIDER_SITE_OTHER): Payer: Self-pay

## 2024-01-31 NOTE — Telephone Encounter (Signed)
 Spoke with the patient's granddaughter and the patient is scheduled with Dr. Prescilla Brod for a RLE angio on 02/20/24 with a 11:00 am arrival time to the [facility]. Pre-procedure instructions were discussed and will be mailed.

## 2024-02-20 ENCOUNTER — Other Ambulatory Visit: Payer: Self-pay

## 2024-02-20 ENCOUNTER — Encounter: Payer: Self-pay | Admitting: Vascular Surgery

## 2024-02-20 ENCOUNTER — Encounter
Admission: RE | Disposition: A | Discharge status: Home / Self Care | Payer: Self-pay | Source: Home / Self Care | Attending: Vascular Surgery

## 2024-02-20 ENCOUNTER — Ambulatory Visit
Admission: RE | Admit: 2024-02-20 | Discharge: 2024-02-20 | Disposition: A | Discharge status: Home / Self Care | Attending: Vascular Surgery | Admitting: Vascular Surgery

## 2024-02-20 DIAGNOSIS — I10 Essential (primary) hypertension: Secondary | ICD-10-CM | POA: Diagnosis not present

## 2024-02-20 DIAGNOSIS — E1152 Type 2 diabetes mellitus with diabetic peripheral angiopathy with gangrene: Secondary | ICD-10-CM | POA: Diagnosis not present

## 2024-02-20 DIAGNOSIS — I70299 Other atherosclerosis of native arteries of extremities, unspecified extremity: Secondary | ICD-10-CM | POA: Diagnosis present

## 2024-02-20 DIAGNOSIS — L97919 Non-pressure chronic ulcer of unspecified part of right lower leg with unspecified severity: Secondary | ICD-10-CM | POA: Diagnosis not present

## 2024-02-20 DIAGNOSIS — E113292 Type 2 diabetes mellitus with mild nonproliferative diabetic retinopathy without macular edema, left eye: Secondary | ICD-10-CM | POA: Insufficient documentation

## 2024-02-20 DIAGNOSIS — I70235 Atherosclerosis of native arteries of right leg with ulceration of other part of foot: Secondary | ICD-10-CM | POA: Insufficient documentation

## 2024-02-20 DIAGNOSIS — I70239 Atherosclerosis of native arteries of right leg with ulceration of unspecified site: Secondary | ICD-10-CM

## 2024-02-20 DIAGNOSIS — E11621 Type 2 diabetes mellitus with foot ulcer: Secondary | ICD-10-CM | POA: Diagnosis not present

## 2024-02-20 DIAGNOSIS — I700 Atherosclerosis of aorta: Secondary | ICD-10-CM | POA: Diagnosis not present

## 2024-02-20 DIAGNOSIS — Z87891 Personal history of nicotine dependence: Secondary | ICD-10-CM | POA: Insufficient documentation

## 2024-02-20 DIAGNOSIS — L97518 Non-pressure chronic ulcer of other part of right foot with other specified severity: Secondary | ICD-10-CM | POA: Diagnosis not present

## 2024-02-20 DIAGNOSIS — Z7984 Long term (current) use of oral hypoglycemic drugs: Secondary | ICD-10-CM | POA: Insufficient documentation

## 2024-02-20 HISTORY — PX: LOWER EXTREMITY ANGIOGRAPHY: CATH118251

## 2024-02-20 LAB — GLUCOSE, CAPILLARY: Glucose-Capillary: 114 mg/dL — ABNORMAL HIGH (ref 70–99)

## 2024-02-20 LAB — CREATININE, SERUM
Creatinine, Ser: 1.22 mg/dL — ABNORMAL HIGH (ref 0.44–1.00)
GFR, Estimated: 45 mL/min — ABNORMAL LOW (ref >60)

## 2024-02-20 LAB — BUN: BUN: 22 mg/dL (ref 8–23)

## 2024-02-20 SURGERY — LOWER EXTREMITY ANGIOGRAPHY
Anesthesia: Moderate Sedation | Site: Leg Lower | Laterality: Right

## 2024-02-20 MED ORDER — MIDAZOLAM HCL 5 MG/5ML IJ SOLN
INTRAMUSCULAR | Status: AC
  Filled 2024-02-20: qty 5

## 2024-02-20 MED ORDER — MIDAZOLAM HCL 2 MG/ML PO SYRP: 8.0000 mg | ORAL_SOLUTION | Freq: Once | ORAL | Status: DC | PRN

## 2024-02-20 MED ORDER — ONDANSETRON HCL 4 MG/2ML IJ SOLN: 4.0000 mg | Freq: Four times a day (QID) | INTRAMUSCULAR | Status: DC | PRN

## 2024-02-20 MED ORDER — IODIXANOL 320 MG/ML IV SOLN
INTRAVENOUS | Status: DC | PRN
  Administered 2024-02-20: 80 mL via INTRA_ARTERIAL

## 2024-02-20 MED ORDER — CEFAZOLIN SODIUM-DEXTROSE 2-4 GM/100ML-% IV SOLN
2.0000 g | INTRAVENOUS | Status: AC
  Administered 2024-02-20: 2 g via INTRAVENOUS

## 2024-02-20 MED ORDER — FENTANYL CITRATE (PF) 100 MCG/2ML IJ SOLN
INTRAMUSCULAR | Status: DC | PRN
Start: 2024-02-20 — End: 2024-02-20
  Administered 2024-02-20: 50 ug via INTRAVENOUS
  Administered 2024-02-20: 25 ug via INTRAVENOUS
  Administered 2024-02-20 (×2): 12.5 ug via INTRAVENOUS
  Administered 2024-02-20: 25 ug via INTRAVENOUS

## 2024-02-20 MED ORDER — CLOPIDOGREL BISULFATE 300 MG PO TABS
300.0000 mg | ORAL_TABLET | ORAL | Status: AC
  Administered 2024-02-20: 300 mg via ORAL

## 2024-02-20 MED ORDER — HEPARIN (PORCINE) IN NACL 2000-0.9 UNIT/L-% IV SOLN
INTRAVENOUS | Status: DC | PRN
  Administered 2024-02-20: 1000 mL

## 2024-02-20 MED ORDER — CLOPIDOGREL BISULFATE 75 MG PO TABS: 75.0000 mg | ORAL_TABLET | Freq: Every day | ORAL | 4 refills | Status: DC

## 2024-02-20 MED ORDER — ACETAMINOPHEN 325 MG PO TABS
650.0000 mg | ORAL_TABLET | ORAL | Status: DC | PRN
Start: 2024-02-20 — End: 2024-02-20

## 2024-02-20 MED ORDER — ASPIRIN 325 MG PO TABS
325.0000 mg | ORAL_TABLET | ORAL | Status: AC
  Administered 2024-02-20: 325 mg via ORAL
  Filled 2024-02-20: qty 1

## 2024-02-20 MED ORDER — CEFAZOLIN SODIUM-DEXTROSE 2-4 GM/100ML-% IV SOLN
INTRAVENOUS | Status: AC
  Filled 2024-02-20: qty 100

## 2024-02-20 MED ORDER — SODIUM CHLORIDE 0.9 % IV SOLN: INTRAVENOUS | Status: DC

## 2024-02-20 MED ORDER — ASPIRIN 325 MG PO TBEC
DELAYED_RELEASE_TABLET | ORAL | Status: AC
  Filled 2024-02-20: qty 1

## 2024-02-20 MED ORDER — HEPARIN SODIUM (PORCINE) 1000 UNIT/ML IJ SOLN
INTRAMUSCULAR | Status: AC
Start: 2024-02-20 — End: ?
  Filled 2024-02-20: qty 10

## 2024-02-20 MED ORDER — OXYCODONE HCL 5 MG PO TABS: 5.0000 mg | ORAL_TABLET | ORAL | Status: DC | PRN

## 2024-02-20 MED ORDER — ASPIRIN 81 MG PO TBEC: 81.0000 mg | DELAYED_RELEASE_TABLET | Freq: Every day | ORAL | 1 refills | Status: DC

## 2024-02-20 MED ORDER — LIDOCAINE-EPINEPHRINE (PF) 1 %-1:200000 IJ SOLN
INTRAMUSCULAR | Status: DC | PRN
  Administered 2024-02-20: 10 mL

## 2024-02-20 MED ORDER — SODIUM CHLORIDE 0.9 % IV SOLN: 250.0000 mL | INTRAVENOUS | Status: DC | PRN

## 2024-02-20 MED ORDER — METHYLPREDNISOLONE SODIUM SUCC 125 MG IJ SOLR: 125.0000 mg | Freq: Once | INTRAMUSCULAR | Status: DC | PRN

## 2024-02-20 MED ORDER — HYDROMORPHONE HCL 1 MG/ML IJ SOLN: 1.0000 mg | Freq: Once | INTRAMUSCULAR | Status: DC | PRN

## 2024-02-20 MED ORDER — FENTANYL CITRATE PF 50 MCG/ML IJ SOSY
PREFILLED_SYRINGE | INTRAMUSCULAR | Status: AC
  Filled 2024-02-20: qty 1

## 2024-02-20 MED ORDER — CLOPIDOGREL BISULFATE 75 MG PO TABS
ORAL_TABLET | ORAL | Status: AC
  Filled 2024-02-20: qty 4

## 2024-02-20 MED ORDER — SODIUM CHLORIDE 0.9% FLUSH: 3.0000 mL | INTRAVENOUS | Status: DC | PRN

## 2024-02-20 MED ORDER — SODIUM CHLORIDE 0.9% FLUSH: 3.0000 mL | Freq: Two times a day (BID) | INTRAVENOUS | Status: DC

## 2024-02-20 MED ORDER — FENTANYL CITRATE (PF) 100 MCG/2ML IJ SOLN
INTRAMUSCULAR | Status: AC
Start: 2024-02-20 — End: ?
  Filled 2024-02-20: qty 2

## 2024-02-20 MED ORDER — DIPHENHYDRAMINE HCL 50 MG/ML IJ SOLN: 50.0000 mg | Freq: Once | INTRAMUSCULAR | Status: DC | PRN

## 2024-02-20 MED ORDER — MIDAZOLAM HCL 2 MG/2ML IJ SOLN
INTRAMUSCULAR | Status: DC | PRN
  Administered 2024-02-20 (×5): 1 mg via INTRAVENOUS

## 2024-02-20 MED ORDER — HYDRALAZINE HCL 20 MG/ML IJ SOLN: 5.0000 mg | INTRAMUSCULAR | Status: DC | PRN

## 2024-02-20 MED ORDER — HEPARIN SODIUM (PORCINE) 1000 UNIT/ML IJ SOLN
INTRAMUSCULAR | Status: DC | PRN
Start: 2024-02-20 — End: 2024-02-20
  Administered 2024-02-20: 7000 [IU] via INTRAVENOUS

## 2024-02-20 MED ORDER — FAMOTIDINE 20 MG PO TABS: 40.0000 mg | ORAL_TABLET | Freq: Once | ORAL | Status: DC | PRN

## 2024-02-20 MED ORDER — LABETALOL HCL 5 MG/ML IV SOLN: 10.0000 mg | INTRAVENOUS | Status: DC | PRN

## 2024-02-20 MED ORDER — MORPHINE SULFATE (PF) 4 MG/ML IV SOLN: 2.0000 mg | INTRAVENOUS | Status: DC | PRN

## 2024-02-20 SURGICAL SUPPLY — 26 items
BALLOON JADE .0142.5 X 40 (BALLOONS) IMPLANT
BALLOON LUTONIX 018 4X60X130 (BALLOONS) IMPLANT
BALLOON ULTRSC 014 2.5X200X150 (BALLOONS) IMPLANT
BALLOON ULTRSCR 014 3.5X40X150 (BALLOONS) IMPLANT
BALLOON ULTRVRSE 2X300X150 (BALLOONS) IMPLANT
CATH 0.018 NAVICROSS ANG 135 (CATHETERS) IMPLANT
CATH ANGIO 5F PIGTAIL 65CM (CATHETERS) IMPLANT
CATH BEACON 5 .035 65 RIM TIP (CATHETERS) IMPLANT
CATH CXI SUPP 2.6F 150 ANG (CATHETERS) IMPLANT
CATH VERT 5FR 125CM (CATHETERS) IMPLANT
COVER PROBE ULTRASOUND 5X96 (MISCELLANEOUS) IMPLANT
DEVICE PRESTO INFLATION (MISCELLANEOUS) IMPLANT
DEVICE STARCLOSE SE CLOSURE (Vascular Products) IMPLANT
GLIDEWIRE ADV .014X300CM (WIRE) IMPLANT
GOWN STRL REUS W/ TWL LRG LVL3 (GOWN DISPOSABLE) ×1 IMPLANT
NDL ENTRY 21GA 7CM ECHOTIP (NEEDLE) IMPLANT
NEEDLE ENTRY 21GA 7CM ECHOTIP (NEEDLE) ×1 IMPLANT
PACK ANGIOGRAPHY (CUSTOM PROCEDURE TRAY) ×1 IMPLANT
PANNUS RETENTION SYSTEM 2 PAD (MISCELLANEOUS) IMPLANT
SET INTRO CAPELLA COAXIAL (SET/KITS/TRAYS/PACK) IMPLANT
SHEATH BRITE TIP 5FRX11 (SHEATH) IMPLANT
SHEATH RAABE 6FRX70 (SHEATH) IMPLANT
STENT ESPRIT BTK 2.5X38 SCAFF (Permanent Stent) IMPLANT
SYR MEDRAD MARK 7 150ML (SYRINGE) IMPLANT
TUBING CONTRAST HIGH PRESS 72 (TUBING) IMPLANT
WIRE J 3MM .035X145CM (WIRE) IMPLANT

## 2024-02-20 NOTE — Interval H&P Note (Signed)
 History and Physical Interval Note:  02/20/2024 12:49 PM  Rose Perry  has presented today for surgery, with the diagnosis of RLE Angio   ASO w ulceration.  The various methods of treatment have been discussed with the patient and family. After consideration of risks, benefits and other options for treatment, the patient has consented to  Procedure(s): Lower Extremity Angiography (Right) as a surgical intervention.  The patient's history has been reviewed, patient examined, no change in status, stable for surgery.  I have reviewed the patient's chart and labs.  Questions were answered to the patient's satisfaction.     Devon Fogo

## 2024-02-20 NOTE — Op Note (Signed)
 [facility]  Percutaneous Study/Intervention Procedural Note   Date of Surgery: 02/20/2024  Surgeon:  Jackquelyn Mass, MD.  Pre-operative Diagnosis: Atherosclerotic occlusive disease with ulceration right lower extremity  Post-operative diagnosis:  Same  Procedure(s) Performed:             1.  Introduction catheter into right lower extremity 3rd order catheter placement              2.    Contrast injection right lower extremity for distal runoff             3.  Percutaneous transluminal angioplasty and stent placement right peroneal artery.             4.  Star close closure left common femoral arteriotomy  Anesthesia: Conscious sedation was administered under my direct supervision by the interventional radiology RN. IV Versed plus fentanyl were utilized. Continuous ECG, pulse oximetry and blood pressure was monitored throughout the entire procedure.  Conscious sedation was for a total of 106 minutes.  Sheath: 6 [place] Rabie left common femoral retrograde  Contrast: 80 cc  Fluoroscopy Time: 23.2 minutes  Indications:  Brodie Cannon presents with wound of the right lower extremity.  Physical examination and basis studies demonstrate significant atherosclerotic occlusive disease.  This places the patient at increased risk for limb loss.  Angiography with the hope for intervention is recommended for limb salvage.  The risks and benefits are reviewed all questions answered patient agrees to proceed.  Procedure:  EMILYROSE DARRAH is a 78 y.o. y.o. female who was identified and appropriate procedural time out was performed.  The patient was then placed supine on the table and prepped and draped in the usual sterile fashion.    Ultrasound was placed in the sterile sleeve and the left groin was evaluated the left common femoral artery was echolucent and pulsatile indicating patency.  Image was recorded for the permanent record and under real-time visualization a  microneedle was inserted into the common femoral artery microwire followed by a micro-sheath.  A J-wire was then advanced through the micro-sheath and a  5 [place] sheath was then inserted over a J-wire. J-wire was then advanced and a 5 French pigtail catheter was positioned at the level of T12. AP projection of the aorta was then obtained. Pigtail catheter was repositioned to above the bifurcation and a LAO view of the pelvis was obtained.  Subsequently a rim catheter with the stiff angle Glidewire was used to cross the aortic bifurcation the catheter wire were advanced down into the right distal external iliac artery. Oblique view of the femoral bifurcation was then obtained and subsequently the wire was reintroduced and the pigtail catheter negotiated into the SFA representing third order catheter placement. Distal runoff was then performed.  7000 units of heparin was then given and allowed to circulate and a 6 [place] Rabie sheath was advanced up and over the bifurcation and positioned in the femoral artery  KMP  catheter and stiff angle Glidewire were then negotiated down into the distal popliteal.  Distal runoff was then completed by hand injection through the catheter.  A 0.014 wire was then reintroduced and a 3.5 mm x 40 mm ultra score balloon was used to angioplasty the tibioperoneal trunk. Inflation was to 10 atmospheres for approximately 2 minutes. Follow-up imaging demonstrated patency with adequate preparation of the vessel for a drug-coated balloon.  At this point I reintroduced a CXI catheter using several different wires of  I was able to negotiate wiring removed distal peroneal occlusion.  Hand-injection of contrast through the catheter demonstrated intraluminal position within the plantar arteries.  Wire was reintroduced.  Initially a 2 x 30 cm Ultraverse balloon was used to angioplasty the.  Stairs were 1 minute.  Next follow-up imaging 2.5 mm x 200 mm ultra score was utilized to treat the distal  peroneal artery.  Follow-up imaging demonstrated greater than 70% stenosis at the edge of the previous occlusion otherwise the peroneal artery quite good with less than 10% residual stenosis filling the plantar arteries.  Given this finding I would like to use 2.5 mm x 38 mm Esprit stent.  It was advanced across this stenosis in the mid peroneal inflated to 14 atm for 30 seconds.  Next a 2.5 Jade balloon was advanced and inflated to 24 atm for approximately 30 seconds.  Follow-up imaging demonstrated wide patency of the peroneal with less than 10% residual stenosis.  The tibioperoneal trunk was then readmitted with significant stenosis and a 4 mm x 60 mm Lutonix drug-eluting balloon was advanced across the tibioperoneal trunk and inflated to 10 atm for 1.  Follow-up imaging demonstrated less than 10% residual stenosis with preservation of the peroneal runoff to the filling of the plantar arch.  After review of these images the sheath is pulled into the left external iliac oblique of the common femoral is obtained and a Star close device deployed. There no immediate complications.   Findings:  The abdominal aorta is opacified with a bolus injection contrast. Renal arteries are single and widely patent right. The aorta itself has diffuse disease but no hemodynamically significant lesions. The common and external iliac arteries are widely patent bilaterally.  The right common femoral is widely patent as is the profunda femoris.  The SFA and popliteal artery demonstrate diffuse atherosclerotic changes but there are no hemodynamically significant stenoses.  The trifurcation is heavily diseased with occlusion of the anterior tibial and posterior tibial.  Tibioperoneal trunk demonstrates a greater than 90% stenosis with most disease.  The peroneal is patent at its origin and down to its midportion where it occludes.  There is reconstitution at the ankle with several large collaterals predominantly to the plantar  arteries.  Successful crossing of the occluded segment of the peroneal had been achieved it was angioplasty first to 2 mm then 2.5.  Greater than 70% residual stenosis right at the level of the occlusion.  This was 2.5 mm x 38 mm Esprit stent.  Following stent placement there is less than 10% residual stenosis following contrast dye.  Following angioplasty of the tibioperoneal trunk there is now wide patency with less than 10% residual.    Summary: Successful recanalization right lower extremity for limb salvage                        Disposition: Patient was taken to the recovery room in stable condition having tolerated the procedure well.  Jae Bruck, Ninette Basque 02/20/2024,3:57 PM

## 2024-02-21 ENCOUNTER — Telehealth (INDEPENDENT_AMBULATORY_CARE_PROVIDER_SITE_OTHER): Payer: Self-pay | Admitting: Vascular Surgery

## 2024-02-21 NOTE — Telephone Encounter (Signed)
 LVM for pt TCB and schedule appt  Follow up with Schnier, Ninette Basque, MD (Vascular Surgery) in 4 weeks (03/19/2024); follow up after procedure with an ABI

## 2024-02-22 ENCOUNTER — Encounter: Payer: Self-pay | Admitting: Vascular Surgery

## 2024-03-20 ENCOUNTER — Other Ambulatory Visit (INDEPENDENT_AMBULATORY_CARE_PROVIDER_SITE_OTHER): Payer: Self-pay | Admitting: Vascular Surgery

## 2024-03-20 DIAGNOSIS — Z9889 Other specified postprocedural states: Secondary | ICD-10-CM

## 2024-03-21 ENCOUNTER — Other Ambulatory Visit (INDEPENDENT_AMBULATORY_CARE_PROVIDER_SITE_OTHER)

## 2024-03-21 ENCOUNTER — Encounter (INDEPENDENT_AMBULATORY_CARE_PROVIDER_SITE_OTHER): Payer: Self-pay | Admitting: Nurse Practitioner

## 2024-03-21 ENCOUNTER — Ambulatory Visit (INDEPENDENT_AMBULATORY_CARE_PROVIDER_SITE_OTHER): Admitting: Nurse Practitioner

## 2024-03-21 VITALS — BP 138/75 | HR 67 | Resp 16 | Ht 67.0 in | Wt 233.0 lb

## 2024-03-21 DIAGNOSIS — I739 Peripheral vascular disease, unspecified: Secondary | ICD-10-CM | POA: Diagnosis not present

## 2024-03-21 DIAGNOSIS — I10 Essential (primary) hypertension: Secondary | ICD-10-CM

## 2024-03-21 DIAGNOSIS — I890 Lymphedema, not elsewhere classified: Secondary | ICD-10-CM

## 2024-03-21 DIAGNOSIS — Z9889 Other specified postprocedural states: Secondary | ICD-10-CM

## 2024-03-21 DIAGNOSIS — E113292 Type 2 diabetes mellitus with mild nonproliferative diabetic retinopathy without macular edema, left eye: Secondary | ICD-10-CM

## 2024-03-25 LAB — VAS US ABI WITH/WO TBI
Left ABI: 0.97
Right ABI: 0.71

## 2024-03-25 NOTE — Progress Notes (Signed)
 Subjective:    Patient ID: Rose Perry, female    DOB: [birth year], 78 y.o.   MRN: [id number] Chief Complaint  Patient presents with   Follow-up    4 week follow up ABI     The patient returns to the office for followup and review status post angiogram with intervention on 02/20/2024.   Procedure: Procedure(s) Performed:             1.  Introduction catheter into right lower extremity 3rd order catheter placement              2.    Contrast injection right lower extremity for distal runoff             3.  Percutaneous transluminal angioplasty and stent placement right peroneal artery.             4.  Star close closure left common femoral arteriotomy   The patient notes improvement in the lower extremity symptoms. No interval shortening of the patient's claudication distance or rest pain symptoms. No new ulcers or wounds have occurred since the last visit.  The patient's wound has healed.  There have been no significant changes to the patient's overall health care.  No documented history of amaurosis fugax or recent TIA symptoms. There are no recent neurological changes noted. No documented history of DVT, PE or superficial thrombophlebitis. The patient denies recent episodes of angina or shortness of breath.   ABI's Rt=0.71 and Lt=0.97  (previous ABI's Rt=0.64 and Lt=1.09) Duplex US  of the monophasic tibial waveforms on the right however angiogram does not the her vessels are heavily calcified with occlusion of her anterior tibial and posterior tibial arteries.  She has one-vessel runoff in the peroneal    Review of Systems     Objective:   Physical Exam  BP 138/75 (BP Location: Right Arm, Patient Position: Sitting, Cuff Size: Normal)   Pulse 67   Resp 16   Ht 5' 7 (1.702 m)   Wt 233 lb (105.7 kg)   BMI 36.49 kg/m   Past Medical History:  Diagnosis Date   Celiac artery stenosis (HCC)    CME (cystoid macular edema)    right   CNVM (choroidal neovascular  membrane)    Diabetes mellitus without complication (HCC)    Diverticulosis of colon    Elevated lipids    History of cataract    History of rectal bleeding    Hx of adenomatous colonic polyps    Hyperlipidemia    Hypertension    Hypertensive retinopathy of both eyes    Lymphedema    Primary open angle glaucoma (POAG) of both eyes     Social History   Socioeconomic History   Marital status: Divorced    Spouse name: Not on file   Number of children: Not on file   Years of education: Not on file   Highest education level: Not on file  Occupational History   Not on file  Tobacco Use   Smoking status: Former   Smokeless tobacco: Never  Vaping Use   Vaping status: Never Used  Substance and Sexual Activity   Alcohol use: No   Drug use: No   Sexual activity: Not Currently  Other Topics Concern   Not on file  Social History Narrative   There are several medical conditions patient states she has never had. This RN was deleting some when the patient asked me to stop so she could have list. The ones  I deleted are AAA, arthritis, and anemia.   Social Drivers of Corporate investment banker Strain: Low Risk  (01/18/2023)   Received from [facility]   Overall Financial Resource Strain (CARDIA)    Difficulty of Paying Living Expenses: Not hard at all  Food Insecurity: No Food Insecurity (01/18/2023)   Received from [facility]   Hunger Vital Sign    Within the past 12 months, you worried that your food would run out before you got the money to buy more.: Never true    Within the past 12 months, the food you bought just didn't last and you didn't have money to get more.: Never true  Transportation Needs: No Transportation Needs (01/18/2023)   Received from [facility] - Transportation    In the past 12 months, has lack of transportation kept you from medical appointments or from getting medications?: No    Lack of  Transportation (Non-Medical): No  Physical Activity: Not on file  Stress: Not on file  Social Connections: Not on file  Intimate Partner Violence: Not on file    Past Surgical History:  Procedure Laterality Date   ABDOMINAL HYSTERECTOMY     COLONOSCOPY     COLONOSCOPY WITH PROPOFOL  N/A 08/22/2016   Procedure: COLONOSCOPY WITH PROPOFOL ;  Surgeon: Lamar ONEIDA Holmes, MD;  Location: [facility] ENDOSCOPY;  Service: Endoscopy;  Laterality: N/A;   COLONOSCOPY WITH PROPOFOL  N/A 08/11/2020   Procedure: COLONOSCOPY WITH PROPOFOL ;  Surgeon: Maryruth Ole ONEIDA, MD;  Location: [facility] ENDOSCOPY;  Service: Endoscopy;  Laterality: N/A;   COLONOSCOPY WITH PROPOFOL  N/A 11/12/2021   Procedure: COLONOSCOPY WITH PROPOFOL ;  Surgeon: Maryruth Ole ONEIDA, MD;  Location: [facility] ENDOSCOPY;  Service: Endoscopy;  Laterality: N/A;  DM   COLONOSCOPY WITH PROPOFOL  N/A 08/18/2023   Procedure: COLONOSCOPY WITH PROPOFOL ;  Surgeon: Maryruth Ole ONEIDA, MD;  Location: [facility] ENDOSCOPY;  Service: Endoscopy;  Laterality: N/A;   ESOPHAGOGASTRODUODENOSCOPY (EGD) WITH PROPOFOL  N/A 08/11/2020   Procedure: ESOPHAGOGASTRODUODENOSCOPY (EGD) WITH PROPOFOL ;  Surgeon: Maryruth Ole ONEIDA, MD;  Location: [facility] ENDOSCOPY;  Service: Endoscopy;  Laterality: N/A;   EYE SURGERY     JOINT REPLACEMENT Left    left total knee replacement   LOWER EXTREMITY ANGIOGRAPHY Right 02/20/2024   Procedure: Lower Extremity Angiography;  Surgeon: Jama Cordella [name], MD;  Location: [facility] INVASIVE CV LAB;  Service: Cardiovascular;  Laterality: Right;   ltkr     needle in foot removed     POLYPECTOMY  08/18/2023   Procedure: POLYPECTOMY;  Surgeon: Maryruth Ole ONEIDA, MD;  Location: [facility] ENDOSCOPY;  Service: Endoscopy;;   REFRACTIVE SURGERY      Family History  Problem Relation Age of Onset   Hypertension Mother    Hyperlipidemia Mother    Hypertension Father    Coronary artery disease Brother    Breast cancer Neg Hx     Allergies  Allergen Reactions   Codeine         Latest Ref Rng & Units 08/14/2020    7:18 AM 08/13/2020    5:27 AM 08/12/2020   12:11 PM  CBC  WBC 4.0 - 10.5 K/uL  9.1    Hemoglobin 12.0 - 15.0 g/dL 8.5  8.4  8.2   Hematocrit 36.0 - 46.0 %  26.8  26.1   Platelets 150 - 400 K/uL  205        CMP     Component Value Date/Time   NA 143 08/12/2020 0619  NA 137 08/23/2013 0519   K 3.5 08/12/2020 0619   K 3.4 (L) 08/23/2013 0519   CL 111 08/12/2020 0619   CL 103 08/23/2013 0519   CO2 26 08/12/2020 0619   CO2 30 08/23/2013 0519   GLUCOSE 108 (H) 08/12/2020 0619   GLUCOSE 168 (H) 08/23/2013 0519   BUN 22 02/20/2024 1141   BUN 12 08/23/2013 0519   CREATININE 1.22 (H) 02/20/2024 1141   CREATININE 1.17 08/23/2013 0519   CALCIUM 7.7 (L) 08/12/2020 0619   CALCIUM 8.2 (L) 08/23/2013 0519   PROT 4.8 (L) 08/12/2020 0619   ALBUMIN 2.5 (L) 08/12/2020 0619   AST 17 08/12/2020 0619   ALT 10 08/12/2020 0619   ALKPHOS 40 08/12/2020 0619   BILITOT 0.6 08/12/2020 0619   GFRNONAA 45 (L) 02/20/2024 1141   GFRNONAA 48 (L) 08/23/2013 0519     VAS US  ABI WITH/WO TBI Result Date: 01/29/2024  LOWER EXTREMITY DOPPLER STUDY [name]  Date of Exam:   01/25/2024 Medical Rec #: [id number]       Accession #:    [id number] Date of Birth: [birth year]      Patient Gender: F Patient Age:   40 years Exam Location:  [facility] Procedure:      VAS US  ABI WITH/WO TBI Referring Phys: --------------------------------------------------------------------------------  Indications: Peripheral artery disease. Right little toe slow healing wound  Comparison Study: 05/2021 Performing Technologist: Jerel Croak RVT  Examination Guidelines: A complete evaluation includes at minimum, Doppler waveform signals and systolic blood pressure reading at the level of bilateral brachial, anterior tibial, and posterior tibial arteries, when vessel segments are accessible. Bilateral testing is considered an integral part of a complete examination.  Photoelectric Plethysmograph (PPG) waveforms and toe systolic pressure readings are included as required and additional duplex testing as needed. Limited examinations for reoccurring indications may be performed as noted.  ABI Findings: +---------+------------------+-----+-------------------+--------+ Right    Rt Pressure (mmHg)IndexWaveform           Comment  +---------+------------------+-----+-------------------+--------+ Brachial 143                                                +---------+------------------+-----+-------------------+--------+ PTA      92                0.64 dampened monophasic         +---------+------------------+-----+-------------------+--------+ DP       71                0.50 dampened monophasic         +---------+------------------+-----+-------------------+--------+ Great Toe49                0.34 Abnormal                    +---------+------------------+-----+-------------------+--------+ +---------+------------------+-----+----------+-------+ Left     Lt Pressure (mmHg)IndexWaveform  Comment +---------+------------------+-----+----------+-------+ Brachial 139                                      +---------+------------------+-----+----------+-------+ PTA      156               1.09 monophasic        +---------+------------------+-----+----------+-------+ DP       135  0.94 monophasic        +---------+------------------+-----+----------+-------+ Great Toe126               0.88 Normal            +---------+------------------+-----+----------+-------+ +-------+-----------+-----------+------------+------------+ ABI/TBIToday's ABIToday's TBIPrevious ABIPrevious TBI +-------+-----------+-----------+------------+------------+ Right  .64        .34        1.36        .69          +-------+-----------+-----------+------------+------------+ Left   1.09       .88        1.16        .94           +-------+-----------+-----------+------------+------------+ Right ABIs and TBIs appear decreased compared to prior study on 05/2021.  Summary: Right: Resting right ankle-brachial index indicates moderate right lower extremity arterial disease. The right toe-brachial index is abnormal. Left: Resting left ankle-brachial index is within normal range. The left toe-brachial index is normal. *See table(s) above for measurements and observations.  Electronically signed by Cordella Shawl MD on 01/29/2024 at 8:42:38 AM.    Final        Assessment & Plan:   1. Peripheral arterial disease with history of revascularization (HCC) (Primary) Recommend:  The patient is status post successful angiogram with intervention.  The patient reports that the claudication symptoms and leg pain has improved.   The patient denies lifestyle limiting changes at this point in time.  No further invasive studies, angiography or surgery at this time. The patient should continue walking and begin a more formal exercise program.  The patient should continue antiplatelet therapy and aggressive treatment of the lipid abnormalities  Continued surveillance is indicated as atherosclerosis is likely to progress with time.    Patient should undergo noninvasive studies as ordered. The patient will follow up with me to review the studies.   2. 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  Hgb A1C to be monitored as already arranged by primary service  3. Hypertension, unspecified type Continue antihypertensive medications as already ordered, these medications have been reviewed and there are no changes at this time.   4. Lymphedema We had a long discussion regarding conservative therapies  including use of medical grade compression, elevation and activity.  Will review her progress when she returns for follow-up.  Current Outpatient Medications on File Prior to Visit  Medication Sig Dispense Refill   amLODipine (NORVASC) 5 MG tablet Take 1 tablet by mouth daily.     atorvastatin (LIPITOR) 20 MG tablet Take 20 mg by mouth daily.     Multiple Vitamins-Minerals (SENTRY PO) Take by mouth daily.     potassium chloride  (KLOR-CON ) 10 MEQ tablet Take 10 mEq by mouth daily.     timolol (TIMOPTIC) 0.5 % ophthalmic solution 1 drop 2 (two) times daily.     triamterene-hydrochlorothiazide (MAXZIDE-25) 37.5-25 MG tablet Take 1 tablet by mouth daily.     aspirin  EC 81 MG tablet Take 1 tablet (81 mg total) by mouth daily. Swallow whole. 150 tablet 1   atorvastatin (LIPITOR) 20 MG tablet Take 1 tablet by mouth daily.     clopidogrel  (PLAVIX ) 75 MG tablet Take 1 tablet (75 mg total) by mouth daily. 30 tablet 4   metFORMIN (GLUCOPHAGE) 500 MG tablet Take  1 tablet by mouth 2 (two) times daily with a meal.     metoprolol  tartrate (LOPRESSOR ) 25 MG tablet Take 0.5 tablets (12.5 mg total) by mouth 2 (two) times daily. (Patient not taking: Reported on 03/21/2024) 30 tablet 0   pantoprazole  (PROTONIX ) 40 MG tablet Take 1 tablet (40 mg total) by mouth daily. (Patient not taking: Reported on 03/21/2024) 30 tablet 0   polyethylene glycol (MIRALAX ) 17 g packet Take 17 g by mouth 2 (two) times daily. (Patient not taking: Reported on 03/21/2024) 60 each 0   polyethylene glycol powder (GLYCOLAX /MIRALAX ) 17 GM/SCOOP powder Take by mouth. (Patient not taking: Reported on 03/21/2024)     No current facility-administered medications on file prior to visit.    There are no Patient Instructions on file for this visit. No follow-ups on file.   Elizaveta Mattice E Cipriano Millikan, NP

## 2024-04-18 ENCOUNTER — Telehealth (INDEPENDENT_AMBULATORY_CARE_PROVIDER_SITE_OTHER): Payer: Self-pay

## 2024-04-18 NOTE — Telephone Encounter (Signed)
 Patient reach out to the office stating that she is having right ankle swelling after being up and moving a length of time. Patient was advised to wear compression put on in the morning remove at night. Patient will also elevate while sitting to help with swelling. Patient will contact the office if she having any other symptoms.

## 2024-04-22 DIAGNOSIS — H40051 Ocular hypertension, right eye: Secondary | ICD-10-CM | POA: Diagnosis not present

## 2024-04-22 DIAGNOSIS — H34811 Central retinal vein occlusion, right eye, with macular edema: Secondary | ICD-10-CM | POA: Diagnosis not present

## 2024-04-22 DIAGNOSIS — E113392 Type 2 diabetes mellitus with moderate nonproliferative diabetic retinopathy without macular edema, left eye: Secondary | ICD-10-CM | POA: Diagnosis not present

## 2024-05-28 DIAGNOSIS — E113392 Type 2 diabetes mellitus with moderate nonproliferative diabetic retinopathy without macular edema, left eye: Secondary | ICD-10-CM | POA: Diagnosis not present

## 2024-05-28 DIAGNOSIS — H34811 Central retinal vein occlusion, right eye, with macular edema: Secondary | ICD-10-CM | POA: Diagnosis not present

## 2024-05-28 DIAGNOSIS — H25813 Combined forms of age-related cataract, bilateral: Secondary | ICD-10-CM | POA: Diagnosis not present

## 2024-05-28 DIAGNOSIS — H02403 Unspecified ptosis of bilateral eyelids: Secondary | ICD-10-CM | POA: Diagnosis not present

## 2024-06-14 ENCOUNTER — Other Ambulatory Visit (INDEPENDENT_AMBULATORY_CARE_PROVIDER_SITE_OTHER): Payer: Self-pay | Admitting: Vascular Surgery

## 2024-06-14 DIAGNOSIS — I739 Peripheral vascular disease, unspecified: Secondary | ICD-10-CM

## 2024-06-17 ENCOUNTER — Ambulatory Visit (INDEPENDENT_AMBULATORY_CARE_PROVIDER_SITE_OTHER)

## 2024-06-17 ENCOUNTER — Ambulatory Visit (INDEPENDENT_AMBULATORY_CARE_PROVIDER_SITE_OTHER): Admitting: Nurse Practitioner

## 2024-06-17 ENCOUNTER — Encounter (INDEPENDENT_AMBULATORY_CARE_PROVIDER_SITE_OTHER): Payer: Self-pay | Admitting: Nurse Practitioner

## 2024-06-17 VITALS — BP 145/78 | HR 79 | Resp 18 | Wt 236.4 lb

## 2024-06-17 DIAGNOSIS — Z9889 Other specified postprocedural states: Secondary | ICD-10-CM | POA: Diagnosis not present

## 2024-06-17 DIAGNOSIS — E113292 Type 2 diabetes mellitus with mild nonproliferative diabetic retinopathy without macular edema, left eye: Secondary | ICD-10-CM

## 2024-06-17 DIAGNOSIS — I739 Peripheral vascular disease, unspecified: Secondary | ICD-10-CM

## 2024-06-17 DIAGNOSIS — I890 Lymphedema, not elsewhere classified: Secondary | ICD-10-CM

## 2024-06-17 DIAGNOSIS — M79604 Pain in right leg: Secondary | ICD-10-CM | POA: Diagnosis not present

## 2024-06-17 DIAGNOSIS — I10 Essential (primary) hypertension: Secondary | ICD-10-CM

## 2024-06-17 NOTE — Progress Notes (Signed)
 Subjective:    Patient ID: Rose Perry, female    DOB: 26-Apr-1946, 78 y.o.   MRN: [id number] Chief Complaint  Patient presents with   Follow-up    3 month abi follow up    The patient returns to the office for followup and review status post angiogram with intervention on 02/20/2024.   Procedure: Procedure(s) Performed:             1.  Introduction catheter into right lower extremity 3rd order catheter placement              2.    Contrast injection right lower extremity for distal runoff             3.  Percutaneous transluminal angioplasty and stent placement right peroneal artery.             4.  Star close closure left common femoral arteriotomy   The patient notes improvement in the lower extremity symptoms. No interval shortening of the patient's claudication distance or rest pain symptoms. No new ulcers or wounds have occurred since the last visit.  The patient's wound has healed.  Her largest complaint however is of pain that radiates from her hip/buttocks area down her leg.  In addition she notes that she is experiencing some significant swelling of the right lower extremity.  She endorses wearing compression but it is mild and that she only wears it sporadically.  She also notes that she dangles her legs on a fairly consistent basis off the bed.  She notes that when she elevates her legs however the swelling does improve.  There have been no significant changes to the patient's overall health care.  No documented history of amaurosis fugax or recent TIA symptoms. There are no recent neurological changes noted. No documented history of DVT, PE or superficial thrombophlebitis. The patient denies recent episodes of angina or shortness of breath.   ABI's Rt=0.76 and Lt=0.82  (previous ABI's Rt=0.71 and Lt=0.97) Duplex US  of the monophasic/biphasic tibial waveforms on the right however angiogram does not the her vessels are heavily calcified with occlusion of her anterior  tibial and posterior tibial arteries.  She has one-vessel runoff in the peroneal    Review of Systems  Cardiovascular:  Positive for leg swelling.  Musculoskeletal:  Positive for arthralgias.  Neurological:  Positive for weakness.  All other systems reviewed and are negative.      Objective:   Physical Exam Vitals reviewed.  HENT:     Head: Normocephalic.  Cardiovascular:     Rate and Rhythm: Normal rate.     Pulses:          Dorsalis pedis pulses are detected w/ Doppler on the right side and detected w/ Doppler on the left side.       Posterior tibial pulses are detected w/ Doppler on the right side and detected w/ Doppler on the left side.  Pulmonary:     Effort: Pulmonary effort is normal.  Musculoskeletal:     Right lower leg: Edema present.  Skin:    General: Skin is warm and dry.  Neurological:     Mental Status: She is alert and oriented to person, place, and time.  Psychiatric:        Mood and Affect: Mood normal.        Behavior: Behavior normal.        Thought Content: Thought content normal.        Judgment: Judgment normal.  BP (!) 145/78   Pulse 79   Resp 18   Wt 236 lb 6.4 oz (107.2 kg)   BMI 37.03 kg/m   Past Medical History:  Diagnosis Date   Celiac artery stenosis    CME (cystoid macular edema)    right   CNVM (choroidal neovascular membrane)    Diabetes mellitus without complication (HCC)    Diverticulosis of colon    Elevated lipids    History of cataract    History of rectal bleeding    Hx of adenomatous colonic polyps    Hyperlipidemia    Hypertension    Hypertensive retinopathy of both eyes    Lymphedema    Primary open angle glaucoma (POAG) of both eyes     Social History   Socioeconomic History   Marital status: Divorced    Spouse name: Not on file   Number of children: Not on file   Years of education: Not on file   Highest education level: Not on file  Occupational History   Not on file  Tobacco Use   Smoking  status: Former   Smokeless tobacco: Never  Vaping Use   Vaping status: Never Used  Substance and Sexual Activity   Alcohol use: No   Drug use: No   Sexual activity: Not Currently  Other Topics Concern   Not on file  Social History Narrative   There are several medical conditions patient states she has never had. This RN was deleting some when the patient asked me to stop so she could have list. The ones I deleted are AAA, arthritis, and anemia.   Social Drivers of Corporate investment banker Strain: Low Risk  (01/18/2023)   Received from [facility]   Overall Financial Resource Strain (CARDIA)    Difficulty of Paying Living Expenses: Not hard at all  Food Insecurity: No Food Insecurity (01/18/2023)   Received from [facility]   Hunger Vital Sign    Within the past 12 months, you worried that your food would run out before you got the money to buy more.: Never true    Within the past 12 months, the food you bought just didn't last and you didn't have money to get more.: Never true  Transportation Needs: No Transportation Needs (01/18/2023)   Received from [facility] - Transportation    In the past 12 months, has lack of transportation kept you from medical appointments or from getting medications?: No    Lack of Transportation (Non-Medical): No  Physical Activity: Not on file  Stress: Not on file  Social Connections: Not on file  Intimate Partner Violence: Not on file    Past Surgical History:  Procedure Laterality Date   ABDOMINAL HYSTERECTOMY     COLONOSCOPY     COLONOSCOPY WITH PROPOFOL  N/A 08/22/2016   Procedure: COLONOSCOPY WITH PROPOFOL ;  Surgeon: Lamar ONEIDA Holmes, MD;  Location: [facility] ENDOSCOPY;  Service: Endoscopy;  Laterality: N/A;   COLONOSCOPY WITH PROPOFOL  N/A 08/11/2020   Procedure: COLONOSCOPY WITH PROPOFOL ;  Surgeon: Maryruth Ole ONEIDA, MD;  Location: [facility] ENDOSCOPY;  Service: Endoscopy;  Laterality:  N/A;   COLONOSCOPY WITH PROPOFOL  N/A 11/12/2021   Procedure: COLONOSCOPY WITH PROPOFOL ;  Surgeon: Maryruth Ole ONEIDA, MD;  Location: [facility] ENDOSCOPY;  Service: Endoscopy;  Laterality: N/A;  DM   COLONOSCOPY WITH PROPOFOL  N/A 08/18/2023   Procedure: COLONOSCOPY WITH PROPOFOL ;  Surgeon: Maryruth Ole ONEIDA, MD;  Location: [facility] ENDOSCOPY;  Service:  Endoscopy;  Laterality: N/A;   ESOPHAGOGASTRODUODENOSCOPY (EGD) WITH PROPOFOL  N/A 08/11/2020   Procedure: ESOPHAGOGASTRODUODENOSCOPY (EGD) WITH PROPOFOL ;  Surgeon: Maryruth Ole DASEN, MD;  Location: [facility] ENDOSCOPY;  Service: Endoscopy;  Laterality: N/A;   EYE SURGERY     JOINT REPLACEMENT Left    left total knee replacement   LOWER EXTREMITY ANGIOGRAPHY Right 02/20/2024   Procedure: Lower Extremity Angiography;  Surgeon: Jama Cordella [name], MD;  Location: [facility] INVASIVE CV LAB;  Service: Cardiovascular;  Laterality: Right;   ltkr     needle in foot removed     POLYPECTOMY  08/18/2023   Procedure: POLYPECTOMY;  Surgeon: Maryruth Ole DASEN, MD;  Location: [facility] ENDOSCOPY;  Service: Endoscopy;;   REFRACTIVE SURGERY      Family History  Problem Relation Age of Onset   Hypertension Mother    Hyperlipidemia Mother    Hypertension Father    Coronary artery disease Brother    Breast cancer Neg Hx     Allergies  Allergen Reactions   Codeine        Latest Ref Rng & Units 08/14/2020    7:18 AM 08/13/2020    5:27 AM 08/12/2020   12:11 PM  CBC  WBC 4.0 - 10.5 K/uL  9.1    Hemoglobin 12.0 - 15.0 g/dL 8.5  8.4  8.2   Hematocrit 36.0 - 46.0 %  26.8  26.1   Platelets 150 - 400 K/uL  205        CMP     Component Value Date/Time   NA 143 08/12/2020 0619   NA 137 08/23/2013 0519   K 3.5 08/12/2020 0619   K 3.4 (L) 08/23/2013 0519   CL 111 08/12/2020 0619   CL 103 08/23/2013 0519   CO2 26 08/12/2020 0619   CO2 30 08/23/2013 0519   GLUCOSE 108 (H) 08/12/2020 0619   GLUCOSE 168 (H) 08/23/2013 0519   BUN 22 02/20/2024 1141   BUN 12 08/23/2013  0519   CREATININE 1.22 (H) 02/20/2024 1141   CREATININE 1.17 08/23/2013 0519   CALCIUM 7.7 (L) 08/12/2020 0619   CALCIUM 8.2 (L) 08/23/2013 0519   PROT 4.8 (L) 08/12/2020 0619   ALBUMIN 2.5 (L) 08/12/2020 0619   AST 17 08/12/2020 0619   ALT 10 08/12/2020 0619   ALKPHOS 40 08/12/2020 0619   BILITOT 0.6 08/12/2020 0619   GFRNONAA 45 (L) 02/20/2024 1141   GFRNONAA 48 (L) 08/23/2013 0519     VAS US  ABI WITH/WO TBI Result Date: 01/29/2024  LOWER EXTREMITY DOPPLER STUDY [name]  Date of Exam:   01/25/2024 Medical Rec #: [id number]       Accession #:    [id number] Date of Birth: 1945-09-17      Patient Gender: F Patient Age:   70 years Exam Location:  [facility] Procedure:      VAS US  ABI WITH/WO TBI Referring Phys: --------------------------------------------------------------------------------  Indications: Peripheral artery disease. Right little toe slow healing wound  Comparison Study: 05/2021 Performing Technologist: Jerel Croak RVT  Examination Guidelines: A complete evaluation includes at minimum, Doppler waveform signals and systolic blood pressure reading at the level of bilateral brachial, anterior tibial, and posterior tibial arteries, when vessel segments are accessible. Bilateral testing is considered an integral part of a complete examination. Photoelectric Plethysmograph (PPG) waveforms and toe systolic pressure readings are included as required and additional duplex testing as needed. Limited examinations for reoccurring indications may be performed as noted.  ABI Findings: +---------+------------------+-----+-------------------+--------+  Right    Rt Pressure (mmHg)IndexWaveform           Comment  +---------+------------------+-----+-------------------+--------+ Brachial 143                                                +---------+------------------+-----+-------------------+--------+ PTA      92                0.64 dampened monophasic          +---------+------------------+-----+-------------------+--------+ DP       71                0.50 dampened monophasic         +---------+------------------+-----+-------------------+--------+ Great Toe49                0.34 Abnormal                    +---------+------------------+-----+-------------------+--------+ +---------+------------------+-----+----------+-------+ Left     Lt Pressure (mmHg)IndexWaveform  Comment +---------+------------------+-----+----------+-------+ Brachial 139                                      +---------+------------------+-----+----------+-------+ PTA      156               1.09 monophasic        +---------+------------------+-----+----------+-------+ DP       135               0.94 monophasic        +---------+------------------+-----+----------+-------+ Great Toe126               0.88 Normal            +---------+------------------+-----+----------+-------+ +-------+-----------+-----------+------------+------------+ ABI/TBIToday's ABIToday's TBIPrevious ABIPrevious TBI +-------+-----------+-----------+------------+------------+ Right  .64        .34        1.36        .69          +-------+-----------+-----------+------------+------------+ Left   1.09       .88        1.16        .94          +-------+-----------+-----------+------------+------------+ Right ABIs and TBIs appear decreased compared to prior study on 05/2021.  Summary: Right: Resting right ankle-brachial index indicates moderate right lower extremity arterial disease. The right toe-brachial index is abnormal. Left: Resting left ankle-brachial index is within normal range. The left toe-brachial index is normal. *See table(s) above for measurements and observations.  Electronically signed by Cordella Shawl MD on 01/29/2024 at 8:42:38 AM.    Final        Assessment & Plan:   1. Peripheral arterial disease with history of revascularization (HCC)  (Primary) Recommend:  The patient is status post successful angiogram with intervention.  The patient still continues to have pain in the leg however her iliac and larger vessels are widely patent.  There was no role for intervention and it would not cause the pain that she is describing.  No further invasive studies, angiography or surgery at this time. The patient should continue walking and begin a more formal exercise program.  The patient should continue antiplatelet therapy and aggressive treatment of the lipid abnormalities  Continued surveillance is indicated as atherosclerosis is likely to progress with time.  Patient should undergo noninvasive studies as ordered. The patient will follow up with me to review the studies.   2. 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  Hgb A1C to be monitored as already arranged by primary service  3. Hypertension, unspecified type Continue antihypertensive medications as already ordered, these medications have been reviewed and there are no changes at this time.   4. Lymphedema We had a long discussion regarding conservative therapies including use of medical grade compression, elevation and activity.  Based on this discussion it seems that she has not truly been adhering to conservative therapy exactly as discussed.  We will allow the patient time to try to adhere to conservative therapy to see if this helps with improvement.  We have also measured her leg and give her measurements to try to find proper compression so that she can actually wear it on a day-to-day basis.   5. Leg pain, diffuse, right Based on the patient's description of pain I suspect it may be either hip or sciatic pain.  Will refer  patient to orthopedic surgery for evaluation.  Current Outpatient Medications on File Prior to Visit  Medication Sig Dispense Refill   amLODipine (NORVASC) 5 MG tablet Take 1 tablet by mouth daily.     aspirin  EC 81 MG tablet Take 1 tablet (81 mg total) by mouth daily. Swallow whole. 150 tablet 1   atorvastatin (LIPITOR) 20 MG tablet Take 20 mg by mouth daily.     atorvastatin (LIPITOR) 20 MG tablet Take 1 tablet by mouth daily.     clopidogrel  (PLAVIX ) 75 MG tablet Take 1 tablet (75 mg total) by mouth daily. 30 tablet 4   metFORMIN (GLUCOPHAGE) 500 MG tablet Take 1 tablet by mouth 2 (two) times daily with a meal.     Multiple Vitamins-Minerals (SENTRY PO) Take by mouth daily.     potassium chloride  (KLOR-CON ) 10 MEQ tablet Take 10 mEq by mouth daily.     timolol (TIMOPTIC) 0.5 % ophthalmic solution 1 drop 2 (two) times daily.     triamterene-hydrochlorothiazide (MAXZIDE-25) 37.5-25 MG tablet Take 1 tablet by mouth daily.     metoprolol  tartrate (LOPRESSOR ) 25 MG tablet Take 0.5 tablets (12.5 mg total) by mouth 2 (two) times daily. (Patient not taking: Reported on 06/17/2024) 30 tablet 0   pantoprazole  (PROTONIX ) 40 MG tablet Take 1 tablet (40 mg total) by mouth daily. (Patient not taking: Reported on 06/17/2024) 30 tablet 0   polyethylene glycol (MIRALAX ) 17 g packet Take 17 g by mouth 2 (two) times daily. (Patient not taking: Reported on 06/17/2024) 60 each 0   polyethylene glycol powder (GLYCOLAX /MIRALAX ) 17 GM/SCOOP powder Take by mouth. (Patient not taking: Reported on 06/17/2024)     No current facility-administered medications on file prior to visit.    There are no Patient Instructions on file for this visit. No follow-ups on file.   Wilberth Damon E Mackenzee Becvar, NP

## 2024-06-24 LAB — VAS US ABI WITH/WO TBI
Left ABI: 0.82
Right ABI: 0.76

## 2024-07-09 ENCOUNTER — Other Ambulatory Visit: Payer: Self-pay | Admitting: Physician Assistant

## 2024-07-09 DIAGNOSIS — Z1231 Encounter for screening mammogram for malignant neoplasm of breast: Secondary | ICD-10-CM

## 2024-07-25 ENCOUNTER — Inpatient Hospital Stay: Admitting: Anesthesiology

## 2024-07-25 ENCOUNTER — Other Ambulatory Visit: Payer: Self-pay | Admitting: Physician Assistant

## 2024-07-25 ENCOUNTER — Emergency Department: Admitting: Anesthesiology

## 2024-07-25 ENCOUNTER — Inpatient Hospital Stay

## 2024-07-25 ENCOUNTER — Other Ambulatory Visit: Payer: Self-pay

## 2024-07-25 ENCOUNTER — Ambulatory Visit
Admission: RE | Admit: 2024-07-25 | Discharge: 2024-07-25 | Disposition: A | Discharge status: Home / Self Care | Source: Ambulatory Visit | Attending: Physician Assistant | Admitting: Physician Assistant

## 2024-07-25 ENCOUNTER — Inpatient Hospital Stay
Admission: EM | Admit: 2024-07-25 | Discharge: 2024-08-06 | DRG: 213 | Disposition: A | Discharge status: Skilled Nursing Facility | Source: Ambulatory Visit

## 2024-07-25 ENCOUNTER — Encounter: Payer: Self-pay | Admitting: Vascular Surgery

## 2024-07-25 ENCOUNTER — Encounter
Admission: EM | Disposition: A | Discharge status: Skilled Nursing Facility | Payer: Self-pay | Source: Ambulatory Visit | Attending: Internal Medicine

## 2024-07-25 DIAGNOSIS — I713 Abdominal aortic aneurysm, ruptured, unspecified: Secondary | ICD-10-CM | POA: Diagnosis present

## 2024-07-25 DIAGNOSIS — J9691 Respiratory failure, unspecified with hypoxia: Secondary | ICD-10-CM | POA: Diagnosis not present

## 2024-07-25 DIAGNOSIS — R918 Other nonspecific abnormal finding of lung field: Secondary | ICD-10-CM | POA: Diagnosis not present

## 2024-07-25 DIAGNOSIS — R000 Tachycardia, unspecified: Secondary | ICD-10-CM | POA: Diagnosis present

## 2024-07-25 DIAGNOSIS — E782 Mixed hyperlipidemia: Secondary | ICD-10-CM | POA: Diagnosis not present

## 2024-07-25 DIAGNOSIS — R630 Anorexia: Secondary | ICD-10-CM | POA: Insufficient documentation

## 2024-07-25 DIAGNOSIS — Z515 Encounter for palliative care: Secondary | ICD-10-CM | POA: Diagnosis not present

## 2024-07-25 DIAGNOSIS — Z8249 Family history of ischemic heart disease and other diseases of the circulatory system: Secondary | ICD-10-CM

## 2024-07-25 DIAGNOSIS — J811 Chronic pulmonary edema: Secondary | ICD-10-CM | POA: Diagnosis present

## 2024-07-25 DIAGNOSIS — E8770 Fluid overload, unspecified: Secondary | ICD-10-CM | POA: Diagnosis present

## 2024-07-25 DIAGNOSIS — Z87891 Personal history of nicotine dependence: Secondary | ICD-10-CM

## 2024-07-25 DIAGNOSIS — Z7984 Long term (current) use of oral hypoglycemic drugs: Secondary | ICD-10-CM | POA: Diagnosis not present

## 2024-07-25 DIAGNOSIS — D696 Thrombocytopenia, unspecified: Secondary | ICD-10-CM | POA: Diagnosis present

## 2024-07-25 DIAGNOSIS — Y712 Prosthetic and other implants, materials and accessory cardiovascular devices associated with adverse incidents: Secondary | ICD-10-CM | POA: Diagnosis not present

## 2024-07-25 DIAGNOSIS — R0989 Other specified symptoms and signs involving the circulatory and respiratory systems: Secondary | ICD-10-CM | POA: Diagnosis not present

## 2024-07-25 DIAGNOSIS — R1084 Generalized abdominal pain: Secondary | ICD-10-CM | POA: Insufficient documentation

## 2024-07-25 DIAGNOSIS — Z79899 Other long term (current) drug therapy: Secondary | ICD-10-CM

## 2024-07-25 DIAGNOSIS — R112 Nausea with vomiting, unspecified: Secondary | ICD-10-CM | POA: Diagnosis not present

## 2024-07-25 DIAGNOSIS — R571 Hypovolemic shock: Secondary | ICD-10-CM | POA: Diagnosis not present

## 2024-07-25 DIAGNOSIS — R578 Other shock: Secondary | ICD-10-CM

## 2024-07-25 DIAGNOSIS — Z6837 Body mass index (BMI) 37.0-37.9, adult: Secondary | ICD-10-CM

## 2024-07-25 DIAGNOSIS — I2489 Other forms of acute ischemic heart disease: Secondary | ICD-10-CM | POA: Diagnosis not present

## 2024-07-25 DIAGNOSIS — R188 Other ascites: Secondary | ICD-10-CM | POA: Diagnosis present

## 2024-07-25 DIAGNOSIS — N179 Acute kidney failure, unspecified: Secondary | ICD-10-CM | POA: Diagnosis not present

## 2024-07-25 DIAGNOSIS — I50810 Right heart failure, unspecified: Secondary | ICD-10-CM | POA: Diagnosis not present

## 2024-07-25 DIAGNOSIS — I700 Atherosclerosis of aorta: Secondary | ICD-10-CM | POA: Diagnosis not present

## 2024-07-25 DIAGNOSIS — I509 Heart failure, unspecified: Secondary | ICD-10-CM | POA: Diagnosis not present

## 2024-07-25 DIAGNOSIS — I82442 Acute embolism and thrombosis of left tibial vein: Secondary | ICD-10-CM | POA: Diagnosis not present

## 2024-07-25 DIAGNOSIS — Z96652 Presence of left artificial knee joint: Secondary | ICD-10-CM | POA: Diagnosis present

## 2024-07-25 DIAGNOSIS — J90 Pleural effusion, not elsewhere classified: Secondary | ICD-10-CM | POA: Diagnosis present

## 2024-07-25 DIAGNOSIS — J9811 Atelectasis: Secondary | ICD-10-CM | POA: Diagnosis present

## 2024-07-25 DIAGNOSIS — G9341 Metabolic encephalopathy: Secondary | ICD-10-CM | POA: Diagnosis present

## 2024-07-25 DIAGNOSIS — I745 Embolism and thrombosis of iliac artery: Secondary | ICD-10-CM | POA: Diagnosis present

## 2024-07-25 DIAGNOSIS — J189 Pneumonia, unspecified organism: Secondary | ICD-10-CM | POA: Diagnosis not present

## 2024-07-25 DIAGNOSIS — Z794 Long term (current) use of insulin: Secondary | ICD-10-CM | POA: Diagnosis not present

## 2024-07-25 DIAGNOSIS — E872 Acidosis, unspecified: Secondary | ICD-10-CM | POA: Diagnosis present

## 2024-07-25 DIAGNOSIS — I10 Essential (primary) hypertension: Secondary | ICD-10-CM | POA: Diagnosis not present

## 2024-07-25 DIAGNOSIS — I82411 Acute embolism and thrombosis of right femoral vein: Secondary | ICD-10-CM | POA: Diagnosis not present

## 2024-07-25 DIAGNOSIS — Z7902 Long term (current) use of antithrombotics/antiplatelets: Secondary | ICD-10-CM | POA: Diagnosis not present

## 2024-07-25 DIAGNOSIS — R0602 Shortness of breath: Secondary | ICD-10-CM | POA: Diagnosis not present

## 2024-07-25 DIAGNOSIS — E1169 Type 2 diabetes mellitus with other specified complication: Secondary | ICD-10-CM | POA: Diagnosis present

## 2024-07-25 DIAGNOSIS — I3139 Other pericardial effusion (noninflammatory): Secondary | ICD-10-CM | POA: Diagnosis present

## 2024-07-25 DIAGNOSIS — I82431 Acute embolism and thrombosis of right popliteal vein: Secondary | ICD-10-CM | POA: Diagnosis not present

## 2024-07-25 DIAGNOSIS — D62 Acute posthemorrhagic anemia: Secondary | ICD-10-CM | POA: Diagnosis not present

## 2024-07-25 DIAGNOSIS — Z7189 Other specified counseling: Secondary | ICD-10-CM | POA: Diagnosis not present

## 2024-07-25 DIAGNOSIS — R079 Chest pain, unspecified: Secondary | ICD-10-CM | POA: Diagnosis not present

## 2024-07-25 DIAGNOSIS — I517 Cardiomegaly: Secondary | ICD-10-CM | POA: Diagnosis not present

## 2024-07-25 DIAGNOSIS — E785 Hyperlipidemia, unspecified: Secondary | ICD-10-CM | POA: Diagnosis present

## 2024-07-25 DIAGNOSIS — E8721 Acute metabolic acidosis: Secondary | ICD-10-CM | POA: Diagnosis not present

## 2024-07-25 DIAGNOSIS — K429 Umbilical hernia without obstruction or gangrene: Secondary | ICD-10-CM | POA: Diagnosis not present

## 2024-07-25 DIAGNOSIS — Z7982 Long term (current) use of aspirin: Secondary | ICD-10-CM

## 2024-07-25 DIAGNOSIS — Y95 Nosocomial condition: Secondary | ICD-10-CM | POA: Diagnosis present

## 2024-07-25 DIAGNOSIS — I2699 Other pulmonary embolism without acute cor pulmonale: Secondary | ICD-10-CM | POA: Diagnosis not present

## 2024-07-25 DIAGNOSIS — I723 Aneurysm of iliac artery: Secondary | ICD-10-CM | POA: Diagnosis not present

## 2024-07-25 DIAGNOSIS — J9601 Acute respiratory failure with hypoxia: Secondary | ICD-10-CM | POA: Diagnosis not present

## 2024-07-25 DIAGNOSIS — H401130 Primary open-angle glaucoma, bilateral, stage unspecified: Secondary | ICD-10-CM | POA: Diagnosis present

## 2024-07-25 DIAGNOSIS — Z4682 Encounter for fitting and adjustment of non-vascular catheter: Secondary | ICD-10-CM | POA: Diagnosis not present

## 2024-07-25 DIAGNOSIS — I4891 Unspecified atrial fibrillation: Secondary | ICD-10-CM | POA: Diagnosis not present

## 2024-07-25 DIAGNOSIS — Z7901 Long term (current) use of anticoagulants: Secondary | ICD-10-CM | POA: Diagnosis not present

## 2024-07-25 DIAGNOSIS — R0902 Hypoxemia: Secondary | ICD-10-CM | POA: Diagnosis not present

## 2024-07-25 DIAGNOSIS — Z95828 Presence of other vascular implants and grafts: Secondary | ICD-10-CM | POA: Diagnosis not present

## 2024-07-25 DIAGNOSIS — R0600 Dyspnea, unspecified: Secondary | ICD-10-CM | POA: Diagnosis not present

## 2024-07-25 DIAGNOSIS — I2602 Saddle embolus of pulmonary artery with acute cor pulmonale: Secondary | ICD-10-CM | POA: Diagnosis not present

## 2024-07-25 DIAGNOSIS — Z86711 Personal history of pulmonary embolism: Secondary | ICD-10-CM

## 2024-07-25 DIAGNOSIS — Z8679 Personal history of other diseases of the circulatory system: Secondary | ICD-10-CM

## 2024-07-25 DIAGNOSIS — J9600 Acute respiratory failure, unspecified whether with hypoxia or hypercapnia: Secondary | ICD-10-CM | POA: Diagnosis not present

## 2024-07-25 DIAGNOSIS — I7132 Juxtarenal abdominal aortic aneurysm, ruptured: Secondary | ICD-10-CM | POA: Diagnosis not present

## 2024-07-25 DIAGNOSIS — T82310A Breakdown (mechanical) of aortic (bifurcation) graft (replacement), initial encounter: Secondary | ICD-10-CM | POA: Diagnosis not present

## 2024-07-25 DIAGNOSIS — R10A1 Flank pain, right side: Secondary | ICD-10-CM | POA: Diagnosis present

## 2024-07-25 DIAGNOSIS — I480 Paroxysmal atrial fibrillation: Secondary | ICD-10-CM | POA: Diagnosis present

## 2024-07-25 DIAGNOSIS — G931 Anoxic brain damage, not elsewhere classified: Secondary | ICD-10-CM | POA: Diagnosis present

## 2024-07-25 DIAGNOSIS — E119 Type 2 diabetes mellitus without complications: Secondary | ICD-10-CM | POA: Diagnosis not present

## 2024-07-25 DIAGNOSIS — Z7401 Bed confinement status: Secondary | ICD-10-CM | POA: Diagnosis not present

## 2024-07-25 DIAGNOSIS — I7133 Infrarenal abdominal aortic aneurysm, ruptured: Secondary | ICD-10-CM | POA: Diagnosis not present

## 2024-07-25 DIAGNOSIS — Z860101 Personal history of adenomatous and serrated colon polyps: Secondary | ICD-10-CM

## 2024-07-25 DIAGNOSIS — I5A Non-ischemic myocardial injury (non-traumatic): Secondary | ICD-10-CM | POA: Diagnosis present

## 2024-07-25 DIAGNOSIS — G9340 Encephalopathy, unspecified: Secondary | ICD-10-CM | POA: Diagnosis not present

## 2024-07-25 DIAGNOSIS — I7143 Infrarenal abdominal aortic aneurysm, without rupture: Secondary | ICD-10-CM | POA: Diagnosis not present

## 2024-07-25 DIAGNOSIS — Z90710 Acquired absence of both cervix and uterus: Secondary | ICD-10-CM

## 2024-07-25 DIAGNOSIS — E66812 Obesity, class 2: Secondary | ICD-10-CM | POA: Diagnosis present

## 2024-07-25 DIAGNOSIS — Z83438 Family history of other disorder of lipoprotein metabolism and other lipidemia: Secondary | ICD-10-CM

## 2024-07-25 DIAGNOSIS — Z452 Encounter for adjustment and management of vascular access device: Secondary | ICD-10-CM | POA: Diagnosis not present

## 2024-07-25 DIAGNOSIS — R911 Solitary pulmonary nodule: Secondary | ICD-10-CM | POA: Diagnosis present

## 2024-07-25 DIAGNOSIS — I2692 Saddle embolus of pulmonary artery without acute cor pulmonale: Secondary | ICD-10-CM | POA: Diagnosis not present

## 2024-07-25 DIAGNOSIS — E876 Hypokalemia: Secondary | ICD-10-CM | POA: Diagnosis present

## 2024-07-25 HISTORY — PX: ENDOVASCULAR STENT GRAFT (AAA): CATH118280

## 2024-07-25 HISTORY — PX: ENDOVASCULAR STENT GRAFT REPAIR: CATH118379

## 2024-07-25 LAB — CBC
HCT: 42.9 % (ref 36.0–46.0)
HCT: 21.5 % — ABNORMAL LOW (ref 36.0–46.0)
Hemoglobin: 13.5 g/dL (ref 12.0–15.0)
Hemoglobin: 6.9 g/dL — ABNORMAL LOW (ref 12.0–15.0)
MCH: 25.7 pg — ABNORMAL LOW (ref 26.0–34.0)
MCH: 27.8 pg (ref 26.0–34.0)
MCHC: 31.5 g/dL (ref 30.0–36.0)
MCHC: 32.1 g/dL (ref 30.0–36.0)
MCV: 81.7 fL (ref 80.0–100.0)
MCV: 86.7 fL (ref 80.0–100.0)
Platelets: 283 K/uL (ref 150–400)
Platelets: 128 K/uL — ABNORMAL LOW (ref 150–400)
RBC: 5.25 MIL/uL — ABNORMAL HIGH (ref 3.87–5.11)
RBC: 2.48 MIL/uL — ABNORMAL LOW (ref 3.87–5.11)
RDW: 14.1 % (ref 11.5–15.5)
RDW: 15 % (ref 11.5–15.5)
WBC: 9.8 K/uL (ref 4.0–10.5)
WBC: 9.1 K/uL (ref 4.0–10.5)
nRBC: 0 % (ref 0.0–0.2)
nRBC: 0 % (ref 0.0–0.2)

## 2024-07-25 LAB — HEMOGLOBIN A1C
Hgb A1c MFr Bld: 6.5 % — ABNORMAL HIGH (ref 4.8–5.6)
Mean Plasma Glucose: 139.85 mg/dL

## 2024-07-25 LAB — BLOOD GAS, ARTERIAL
Acid-base deficit: 7 mmol/L — ABNORMAL HIGH (ref 0.0–2.0)
Acid-base deficit: 6.6 mmol/L — ABNORMAL HIGH (ref 0.0–2.0)
Bicarbonate: 20.2 mmol/L (ref 20.0–28.0)
Bicarbonate: 19.1 mmol/L — ABNORMAL LOW (ref 20.0–28.0)
FIO2: 100 %
FIO2: 50 %
MECHVT: 450 mL
MECHVT: 450 mL
Mechanical Rate: 20
Mechanical Rate: 24
O2 Saturation: 99.3 %
O2 Saturation: 98.6 %
PEEP: 5 cmH2O
PEEP: 5 cmH2O
Patient temperature: 37
Patient temperature: 37
pCO2 arterial: 46 mmHg (ref 32–48)
pCO2 arterial: 38 mmHg (ref 32–48)
pH, Arterial: 7.25 — ABNORMAL LOW (ref 7.35–7.45)
pH, Arterial: 7.31 — ABNORMAL LOW (ref 7.35–7.45)
pO2, Arterial: 227 mmHg — ABNORMAL HIGH (ref 83–108)
pO2, Arterial: 111 mmHg — ABNORMAL HIGH (ref 83–108)

## 2024-07-25 LAB — DIC (DISSEMINATED INTRAVASCULAR COAGULATION)PANEL
D-Dimer, Quant: 3.86 ug{FEU}/mL — ABNORMAL HIGH (ref 0.00–0.50)
Fibrinogen: 97 mg/dL — CL (ref 210–475)
INR: 2.2 — ABNORMAL HIGH (ref 0.8–1.2)
Platelets: 125 K/uL — ABNORMAL LOW (ref 150–400)
Prothrombin Time: 25.6 s — ABNORMAL HIGH (ref 11.4–15.2)
Smear Review: NONE SEEN
aPTT: >200 s (ref 24–36)

## 2024-07-25 LAB — BASIC METABOLIC PANEL WITH GFR
Anion gap: 13 (ref 5–15)
BUN: 13 mg/dL (ref 8–23)
CO2: 18 mmol/L — ABNORMAL LOW (ref 22–32)
Calcium: 6.4 mg/dL — CL (ref 8.9–10.3)
Chloride: 111 mmol/L (ref 98–111)
Creatinine, Ser: 0.71 mg/dL (ref 0.44–1.00)
GFR, Estimated: >60 mL/min (ref >60)
Glucose, Bld: 349 mg/dL — ABNORMAL HIGH (ref 70–99)
Potassium: 3.6 mmol/L (ref 3.5–5.1)
Sodium: 142 mmol/L (ref 135–145)

## 2024-07-25 LAB — COMPREHENSIVE METABOLIC PANEL WITH GFR
ALT: 14 U/L (ref 0–44)
AST: 20 U/L (ref 15–41)
Albumin: 4 g/dL (ref 3.5–5.0)
Alkaline Phosphatase: 83 U/L (ref 38–126)
Anion gap: 13 (ref 5–15)
BUN: 16 mg/dL (ref 8–23)
CO2: 27 mmol/L (ref 22–32)
Calcium: 9.7 mg/dL (ref 8.9–10.3)
Chloride: 99 mmol/L (ref 98–111)
Creatinine, Ser: 0.9 mg/dL (ref 0.44–1.00)
GFR, Estimated: >60 mL/min (ref >60)
Glucose, Bld: 136 mg/dL — ABNORMAL HIGH (ref 70–99)
Potassium: 3.6 mmol/L (ref 3.5–5.1)
Sodium: 139 mmol/L (ref 135–145)
Total Bilirubin: 0.4 mg/dL (ref 0.0–1.2)
Total Protein: 7.2 g/dL (ref 6.5–8.1)

## 2024-07-25 LAB — URINALYSIS, ROUTINE W REFLEX MICROSCOPIC
Bilirubin Urine: NEGATIVE
Glucose, UA: NEGATIVE mg/dL
Hgb urine dipstick: NEGATIVE
Ketones, ur: 5 mg/dL — AB
Leukocytes,Ua: NEGATIVE
Nitrite: NEGATIVE
Protein, ur: NEGATIVE mg/dL
Specific Gravity, Urine: 1.03 (ref 1.005–1.030)
pH: 7 (ref 5.0–8.0)

## 2024-07-25 LAB — GLUCOSE, CAPILLARY
Glucose-Capillary: 127 mg/dL — ABNORMAL HIGH (ref 70–99)
Glucose-Capillary: 213 mg/dL — ABNORMAL HIGH (ref 70–99)
Glucose-Capillary: 265 mg/dL — ABNORMAL HIGH (ref 70–99)

## 2024-07-25 LAB — HEMOGLOBIN AND HEMATOCRIT, BLOOD
HCT: 24.6 % — ABNORMAL LOW (ref 36.0–46.0)
Hemoglobin: 8.5 g/dL — ABNORMAL LOW (ref 12.0–15.0)

## 2024-07-25 LAB — POCT I-STAT CREATININE: Creatinine, Ser: 1 mg/dL (ref 0.44–1.00)

## 2024-07-25 LAB — TROPONIN T, HIGH SENSITIVITY
Troponin T High Sensitivity: 20 ng/L — ABNORMAL HIGH (ref 0–19)
Troponin T High Sensitivity: <15 ng/L (ref 0–19)

## 2024-07-25 LAB — LIPASE, BLOOD: Lipase: 17 U/L (ref 11–51)

## 2024-07-25 LAB — MRSA NEXT GEN BY PCR, NASAL
MRSA by PCR Next Gen: NOT DETECTED
MRSA by PCR Next Gen: NOT DETECTED

## 2024-07-25 LAB — MASSIVE TRANSFUSION PROTOCOL ORDER (BLOOD BANK NOTIFICATION)

## 2024-07-25 SURGERY — ENDOVASCULAR STENT GRAFT (AAA)
Anesthesia: Moderate Sedation

## 2024-07-25 MED ORDER — CALCIUM CHLORIDE 10 % IV SOLN
INTRAVENOUS | Status: DC | PRN
Start: 2024-07-25 — End: 2024-07-25
  Administered 2024-07-25: .25 g via INTRAVENOUS

## 2024-07-25 MED ORDER — MORPHINE SULFATE (PF) 2 MG/ML IV SOLN
2.0000 mg | INTRAVENOUS | Status: DC | PRN
  Administered 2024-07-26 – 2024-07-31 (×4): 2 mg via INTRAVENOUS
  Administered 2024-08-02: 4 mg via INTRAVENOUS
  Filled 2024-07-25 (×4): qty 1
  Filled 2024-07-25: qty 2
  Filled 2024-07-25: qty 1

## 2024-07-25 MED ORDER — HEPARIN SODIUM (PORCINE) 1000 UNIT/ML IJ SOLN
INTRAMUSCULAR | Status: DC | PRN
  Administered 2024-07-25: 2000 [IU] via INTRAVENOUS
  Administered 2024-07-25: 4000 [IU] via INTRAVENOUS

## 2024-07-25 MED ORDER — SODIUM CHLORIDE 0.9 % IV SOLN: INTRAVENOUS | Status: DC

## 2024-07-25 MED ORDER — ONDANSETRON HCL 4 MG/2ML IJ SOLN
4.0000 mg | Freq: Once | INTRAMUSCULAR | Status: AC
  Administered 2024-07-25: 4 mg via INTRAVENOUS

## 2024-07-25 MED ORDER — HEPARIN (PORCINE) IN NACL 2000-0.9 UNIT/L-% IV SOLN
INTRAVENOUS | Status: DC | PRN
  Administered 2024-07-25: 1000 mL

## 2024-07-25 MED ORDER — CALCIUM GLUCONATE-NACL 2-0.675 GM/100ML-% IV SOLN
2.0000 g | Freq: Once | INTRAVENOUS | Status: AC
  Administered 2024-07-25: 2000 mg via INTRAVENOUS
  Filled 2024-07-25: qty 100

## 2024-07-25 MED ORDER — PHENYLEPHRINE HCL (PRESSORS) 10 MG/ML IV SOLN
INTRAVENOUS | Status: DC | PRN
  Administered 2024-07-25 (×10): 160 ug via INTRAVENOUS

## 2024-07-25 MED ORDER — MORPHINE SULFATE (PF) 4 MG/ML IV SOLN
4.0000 mg | Freq: Once | INTRAVENOUS | Status: AC
  Administered 2024-07-25: 4 mg via INTRAVENOUS
  Filled 2024-07-25: qty 1

## 2024-07-25 MED ORDER — CALCIUM GLUCONATE-NACL 2-0.675 GM/100ML-% IV SOLN: 2.0000 g | Freq: Once | INTRAVENOUS | Status: DC

## 2024-07-25 MED ORDER — FENTANYL 2500MCG IN NS 250ML (10MCG/ML) PREMIX INFUSION
0.0000 ug/h | INTRAVENOUS | Status: DC
  Administered 2024-07-25: 50 ug/h via INTRAVENOUS

## 2024-07-25 MED ORDER — VASOPRESSIN 20 UNIT/ML IV SOLN
INTRAVENOUS | Status: DC | PRN
  Administered 2024-07-25: 4 [IU] via INTRAVENOUS
  Administered 2024-07-25: 8 [IU] via INTRAVENOUS
  Administered 2024-07-25: 4 [IU] via INTRAVENOUS
  Administered 2024-07-25: 8 [IU] via INTRAVENOUS
  Administered 2024-07-25: 4 [IU] via INTRAVENOUS

## 2024-07-25 MED ORDER — HEPARIN (PORCINE) IN NACL 1000-0.9 UT/500ML-% IV SOLN
INTRAVENOUS | Status: DC | PRN
  Administered 2024-07-25: 1500 mL

## 2024-07-25 MED ORDER — EPINEPHRINE 1 MG/10ML IV SOSY
PREFILLED_SYRINGE | INTRAVENOUS | Status: DC | PRN
  Administered 2024-07-25: 80 ug via INTRAVENOUS
  Administered 2024-07-25: 20 ug via INTRAVENOUS

## 2024-07-25 MED ORDER — FENTANYL BOLUS VIA INFUSION
25.0000 ug | INTRAVENOUS | Status: DC | PRN
  Administered 2024-07-26: 50 ug via INTRAVENOUS

## 2024-07-25 MED ORDER — OXYCODONE HCL 5 MG PO TABS: 5.0000 mg | ORAL_TABLET | ORAL | Status: DC | PRN

## 2024-07-25 MED ORDER — IOHEXOL 300 MG/ML  SOLN
100.0000 mL | Freq: Once | INTRAMUSCULAR | Status: AC | PRN
  Administered 2024-07-25: 100 mL via INTRAVENOUS

## 2024-07-25 MED ORDER — SODIUM CHLORIDE 0.9 % IV BOLUS
500.0000 mL | Freq: Once | INTRAVENOUS | Status: AC
  Administered 2024-07-25: 500 mL via INTRAVENOUS

## 2024-07-25 MED ORDER — EPHEDRINE SULFATE-NACL 50-0.9 MG/10ML-% IV SOSY
PREFILLED_SYRINGE | INTRAVENOUS | Status: DC | PRN
  Administered 2024-07-25: 5 mg via INTRAVENOUS

## 2024-07-25 MED ORDER — SODIUM CHLORIDE 0.9% IV SOLUTION: Freq: Once | INTRAVENOUS | Status: DC

## 2024-07-25 MED ORDER — SODIUM CHLORIDE 0.9% FLUSH
10.0000 mL | Freq: Two times a day (BID) | INTRAVENOUS | Status: DC
  Administered 2024-07-25 – 2024-07-26 (×2): 40 mL
  Administered 2024-07-26 – 2024-07-27 (×2): 10 mL

## 2024-07-25 MED ORDER — ACETAMINOPHEN 325 MG RE SUPP: 325.0000 mg | RECTAL | Status: DC | PRN

## 2024-07-25 MED ORDER — ONDANSETRON HCL 4 MG/2ML IJ SOLN
INTRAMUSCULAR | Status: AC
  Filled 2024-07-25: qty 2

## 2024-07-25 MED ORDER — PHENYLEPHRINE HCL-NACL 20-0.9 MG/250ML-% IV SOLN
INTRAVENOUS | Status: DC | PRN
  Administered 2024-07-25: 50 ug/min via INTRAVENOUS

## 2024-07-25 MED ORDER — INSULIN ASPART 100 UNIT/ML IJ SOLN
0.0000 [IU] | Freq: Every day | INTRAMUSCULAR | Status: DC
  Administered 2024-07-25: 2 [IU] via SUBCUTANEOUS
  Filled 2024-07-25: qty 2
  Filled 2024-07-25: qty 3

## 2024-07-25 MED ORDER — SODIUM CHLORIDE 0.9 % IV SOLN
INTRAVENOUS | Status: DC | PRN
Start: 2024-07-25 — End: 2024-07-25

## 2024-07-25 MED ORDER — FENTANYL 2500MCG IN NS 250ML (10MCG/ML) PREMIX INFUSION
INTRAVENOUS | Status: AC
  Filled 2024-07-25: qty 250

## 2024-07-25 MED ORDER — DOCUSATE SODIUM 100 MG PO CAPS
100.0000 mg | ORAL_CAPSULE | Freq: Every day | ORAL | Status: DC
  Administered 2024-07-26 – 2024-08-06 (×11): 100 mg via ORAL
  Filled 2024-07-25 (×11): qty 1

## 2024-07-25 MED ORDER — PANTOPRAZOLE SODIUM 40 MG IV SOLR
40.0000 mg | Freq: Every day | INTRAVENOUS | Status: DC
  Administered 2024-07-25 – 2024-07-30 (×6): 40 mg via INTRAVENOUS
  Filled 2024-07-25 (×6): qty 10

## 2024-07-25 MED ORDER — IODIXANOL 320 MG/ML IV SOLN
INTRAVENOUS | Status: DC | PRN
  Administered 2024-07-25: 85 mL via INTRA_ARTERIAL

## 2024-07-25 MED ORDER — INSULIN ASPART 100 UNIT/ML IJ SOLN
0.0000 [IU] | Freq: Three times a day (TID) | INTRAMUSCULAR | Status: DC
  Administered 2024-07-25: 8 [IU] via SUBCUTANEOUS
  Administered 2024-07-26 (×2): 2 [IU] via SUBCUTANEOUS
  Administered 2024-07-26: 3 [IU] via SUBCUTANEOUS
  Administered 2024-07-27 (×2): 2 [IU] via SUBCUTANEOUS
  Administered 2024-07-27: 3 [IU] via SUBCUTANEOUS
  Administered 2024-07-28 – 2024-07-31 (×6): 2 [IU] via SUBCUTANEOUS
  Administered 2024-08-01: 3 [IU] via SUBCUTANEOUS
  Administered 2024-08-02: 2 [IU] via SUBCUTANEOUS
  Administered 2024-08-02 – 2024-08-04 (×3): 3 [IU] via SUBCUTANEOUS
  Administered 2024-08-05: 2 [IU] via SUBCUTANEOUS
  Administered 2024-08-05: 3 [IU] via SUBCUTANEOUS
  Administered 2024-08-05 – 2024-08-06 (×3): 2 [IU] via SUBCUTANEOUS
  Administered 2024-08-06: 10 [IU] via SUBCUTANEOUS
  Filled 2024-07-25 (×2): qty 2
  Filled 2024-07-25: qty 3
  Filled 2024-07-25 (×2): qty 2
  Filled 2024-07-25: qty 3
  Filled 2024-07-25 (×4): qty 2
  Filled 2024-07-25: qty 3
  Filled 2024-07-25 (×2): qty 2
  Filled 2024-07-25: qty 3
  Filled 2024-07-25: qty 8
  Filled 2024-07-25 (×2): qty 3
  Filled 2024-07-25 (×6): qty 2
  Filled 2024-07-25: qty 3

## 2024-07-25 MED ORDER — CEFAZOLIN SODIUM-DEXTROSE 2-4 GM/100ML-% IV SOLN
2.0000 g | Freq: Three times a day (TID) | INTRAVENOUS | Status: AC
  Administered 2024-07-25 – 2024-07-26 (×2): 2 g via INTRAVENOUS
  Filled 2024-07-25 (×2): qty 100

## 2024-07-25 MED ORDER — ACETAMINOPHEN 325 MG PO TABS: 325.0000 mg | ORAL_TABLET | ORAL | Status: DC | PRN

## 2024-07-25 MED ORDER — CHLORHEXIDINE GLUCONATE CLOTH 2 % EX PADS
6.0000 | MEDICATED_PAD | Freq: Every day | CUTANEOUS | Status: DC
  Administered 2024-07-25 – 2024-07-28 (×4): 6 via TOPICAL

## 2024-07-25 MED ORDER — FENTANYL CITRATE (PF) 100 MCG/2ML IJ SOLN: 25.0000 ug | Freq: Once | INTRAMUSCULAR | Status: DC

## 2024-07-25 MED ORDER — ROCURONIUM BROMIDE 100 MG/10ML IV SOLN
INTRAVENOUS | Status: DC | PRN
  Administered 2024-07-25: 50 mg via INTRAVENOUS

## 2024-07-25 MED ORDER — NOREPINEPHRINE 4 MG/250ML-% IV SOLN
0.0000 ug/min | INTRAVENOUS | Status: DC
  Administered 2024-07-25: 4 ug/min via INTRAVENOUS
  Administered 2024-07-25: 5 ug/min via INTRAVENOUS
  Filled 2024-07-25: qty 250

## 2024-07-25 MED ORDER — ETOMIDATE 2 MG/ML IV SOLN
INTRAVENOUS | Status: DC | PRN
  Administered 2024-07-25: 20 mg via INTRAVENOUS

## 2024-07-25 MED ORDER — POTASSIUM CHLORIDE CRYS ER 20 MEQ PO TBCR: 40.0000 meq | EXTENDED_RELEASE_TABLET | Freq: Every day | ORAL | Status: DC | PRN

## 2024-07-25 MED ORDER — MIDAZOLAM HCL (PF) 2 MG/2ML IJ SOLN
INTRAMUSCULAR | Status: DC | PRN
  Administered 2024-07-25 (×4): 2 mg via INTRAVENOUS

## 2024-07-25 MED ORDER — ALBUMIN HUMAN 5 % IV SOLN
INTRAVENOUS | Status: DC | PRN
Start: 2024-07-25 — End: 2024-07-25

## 2024-07-25 MED ORDER — CALCIUM CHLORIDE 10 % IV SOLN
INTRAVENOUS | Status: AC
  Filled 2024-07-25: qty 10

## 2024-07-25 MED ORDER — NOREPINEPHRINE 4 MG/250ML-% IV SOLN
INTRAVENOUS | Status: AC
  Administered 2024-07-25: 2 ug/min via INTRAVENOUS
  Filled 2024-07-25: qty 250

## 2024-07-25 MED ORDER — VASOPRESSIN 20 UNIT/ML IV SOLN
INTRAVENOUS | Status: AC
  Filled 2024-07-25: qty 2

## 2024-07-25 MED ORDER — SUCCINYLCHOLINE CHLORIDE 200 MG/10ML IV SOSY
PREFILLED_SYRINGE | INTRAVENOUS | Status: DC | PRN
  Administered 2024-07-25: 200 mg via INTRAVENOUS

## 2024-07-25 MED ORDER — PHENOL 1.4 % MT LIQD: 1.0000 | OROMUCOSAL | Status: DC | PRN

## 2024-07-25 MED ORDER — CEFAZOLIN SODIUM-DEXTROSE 2-4 GM/100ML-% IV SOLN
2.0000 g | INTRAVENOUS | Status: AC
  Administered 2024-07-25: 2 g via INTRAVENOUS

## 2024-07-25 MED ORDER — EPINEPHRINE HCL 5 MG/250ML IV SOLN IN NS
INTRAVENOUS | Status: AC
  Filled 2024-07-25: qty 250

## 2024-07-25 MED ORDER — SODIUM CHLORIDE 0.9% FLUSH: 10.0000 mL | INTRAVENOUS | Status: DC | PRN

## 2024-07-25 SURGICAL SUPPLY — 44 items
CATH ACCU-VU SIZ PIG 5F 70CM (CATHETERS) IMPLANT
CATH BALLN CODA 9X100X32 (BALLOONS) IMPLANT
CATH BEACON 5 .035 65 KMP TIP (CATHETERS) IMPLANT
CATH BEACON 5 .035 65 RIM TIP (CATHETERS) IMPLANT
CATH MICROCATH PRGRT 2.8F 110 (CATHETERS) IMPLANT
CATH VS15FR (CATHETERS) IMPLANT
CLOSURE PERCLOSE PROSTYLE (Vascular Products) IMPLANT
COIL 400 COMPLEX SOFT 8X35CM (Vascular Products) IMPLANT
COIL 400 COMPLEX SOFT 8X60CM (Vascular Products) IMPLANT
COIL 400 COMPLEX STD 8X25CM (Vascular Products) IMPLANT
COVER DRAPE FLUORO 36X44 (DRAPES) IMPLANT
DERMABOND ADVANCED .7 DNX12 (GAUZE/BANDAGES/DRESSINGS) IMPLANT
DEVICE OCCLUSION PODJ45 (Vascular Products) IMPLANT
DEVICE PRESTO INFLATION (MISCELLANEOUS) IMPLANT
DEVICE SAFEGUARD 24CM (GAUZE/BANDAGES/DRESSINGS) IMPLANT
DEVICE TORQUE (MISCELLANEOUS) IMPLANT
DRAPE BRACHIAL (DRAPES) IMPLANT
EXCLUDER EXT ENDO 28X4.5 16F (Endovascular Graft) IMPLANT
EXCLUDER TNK 28.5X14.5X12 16F (Endovascular Graft) IMPLANT
GLIDEWIRE STIFF .35X180X3 HYDR (WIRE) IMPLANT
HANDLE DETACHMENT COIL (MISCELLANEOUS) IMPLANT
KIT CATH DIALYSIS TRI 20X13 (CATHETERS) IMPLANT
KIT DIALYSIS CATH TRI 30X13 (CATHETERS) IMPLANT
LEG CONTRALATERAL 16X16X9.5 (Endovascular Graft) IMPLANT
NDL ENTRY 21GA 7CM ECHOTIP (NEEDLE) IMPLANT
NEEDLE ENTRY 21GA 7CM ECHOTIP (NEEDLE) IMPLANT
PACK ANGIOGRAPHY (CUSTOM PROCEDURE TRAY) ×2 IMPLANT
PACK BASIN MAJOR ARMC (MISCELLANEOUS) IMPLANT
SET INTRO CAPELLA COAXIAL (SET/KITS/TRAYS/PACK) IMPLANT
SHEATH BRITE TIP 6FRX11 (SHEATH) IMPLANT
SHEATH BRITE TIP 8FRX11 (SHEATH) IMPLANT
SHEATH DRYSEAL FLEX 12FR 33CM (SHEATH) IMPLANT
SHEATH DRYSEAL FLEX 16FR 33CM (SHEATH) IMPLANT
STENT GRAFT CONTRALAT 16X12X14 (Vascular Products) IMPLANT
STENT LIFESTREAM 7X26X80 (Permanent Stent) IMPLANT
SUT MNCRL AB 4-0 PS2 18 (SUTURE) IMPLANT
SUT PROLENE 5 0 RB 1 DA (SUTURE) IMPLANT
SUT PROLENE 6 0 BV (SUTURE) IMPLANT
SUT SILK 2-0 18XBRD TIE 12 (SUTURE) IMPLANT
SUT SILK 3-0 18XBRD TIE 12 (SUTURE) IMPLANT
TUBING CONTRAST HIGH PRESS 72 (TUBING) IMPLANT
WIRE AMPLATZ SSTIFF .035X260CM (WIRE) IMPLANT
WIRE J 3MM .035X145CM (WIRE) IMPLANT
WIRE SUPRACORE 300CM (WIRE) IMPLANT

## 2024-07-25 NOTE — Consult Note (Signed)
 Hospital Consult    Reason for Consult:  Ruptured AAA Requesting Physician:  Dr Linda Budge MD  MRN #:  [id number]  History of Present Illness: This is a 78 y.o. female who presents to [facility] emergency department with 2 days of bad abdominal pain. Patient endorses it started 2 weeks ago and has progressively gotten worse.   Upon work up patient underwent Ct of the abdomen and pelvis showing AAA with measuring up to 7 CM's. There is also a right iliac aneurysm measuring 3.5 cm. Vascular Surgery consulted to evaluate in and emergent situation.   Past Medical History:  Diagnosis Date   Celiac artery stenosis    CME (cystoid macular edema)    right   CNVM (choroidal neovascular membrane)    Diabetes mellitus without complication (HCC)    Diverticulosis of colon    Elevated lipids    History of cataract    History of rectal bleeding    Hx of adenomatous colonic polyps    Hyperlipidemia    Hypertension    Hypertensive retinopathy of both eyes    Lymphedema    Primary open angle glaucoma (POAG) of both eyes     Past Surgical History:  Procedure Laterality Date   ABDOMINAL HYSTERECTOMY     COLONOSCOPY     COLONOSCOPY WITH PROPOFOL  N/A 08/22/2016   Procedure: COLONOSCOPY WITH PROPOFOL ;  Surgeon: Lamar ONEIDA Holmes, MD;  Location: [facility] ENDOSCOPY;  Service: Endoscopy;  Laterality: N/A;   COLONOSCOPY WITH PROPOFOL  N/A 08/11/2020   Procedure: COLONOSCOPY WITH PROPOFOL ;  Surgeon: Maryruth Ole ONEIDA, MD;  Location: [facility] ENDOSCOPY;  Service: Endoscopy;  Laterality: N/A;   COLONOSCOPY WITH PROPOFOL  N/A 11/12/2021   Procedure: COLONOSCOPY WITH PROPOFOL ;  Surgeon: Maryruth Ole ONEIDA, MD;  Location: [facility] ENDOSCOPY;  Service: Endoscopy;  Laterality: N/A;  DM   COLONOSCOPY WITH PROPOFOL  N/A 08/18/2023   Procedure: COLONOSCOPY WITH PROPOFOL ;  Surgeon: Maryruth Ole ONEIDA, MD;  Location: [facility] ENDOSCOPY;  Service: Endoscopy;  Laterality: N/A;   ESOPHAGOGASTRODUODENOSCOPY (EGD) WITH PROPOFOL  N/A  08/11/2020   Procedure: ESOPHAGOGASTRODUODENOSCOPY (EGD) WITH PROPOFOL ;  Surgeon: Maryruth Ole ONEIDA, MD;  Location: [facility] ENDOSCOPY;  Service: Endoscopy;  Laterality: N/A;   EYE SURGERY     JOINT REPLACEMENT Left    left total knee replacement   LOWER EXTREMITY ANGIOGRAPHY Right 02/20/2024   Procedure: Lower Extremity Angiography;  Surgeon: Jama Cordella [name], MD;  Location: [facility] INVASIVE CV LAB;  Service: Cardiovascular;  Laterality: Right;   ltkr     needle in foot removed     POLYPECTOMY  08/18/2023   Procedure: POLYPECTOMY;  Surgeon: Maryruth Ole ONEIDA, MD;  Location: [facility] ENDOSCOPY;  Service: Endoscopy;;   REFRACTIVE SURGERY      Allergies  Allergen Reactions   Codeine     Prior to Admission medications   Medication Sig Start Date End Date Taking? Authorizing Provider  amLODipine (NORVASC) 5 MG tablet Take 1 tablet by mouth daily. 10/07/20   [provider]  aspirin  EC 81 MG tablet Take 1 tablet (81 mg total) by mouth daily. Swallow whole. 02/21/24 02/20/25  Schnier, Cordella [name], MD  atorvastatin (LIPITOR) 20 MG tablet Take 20 mg by mouth daily.    [provider]  atorvastatin (LIPITOR) 20 MG tablet Take 1 tablet by mouth daily. 05/10/21   [provider]  clopidogrel  (PLAVIX ) 75 MG tablet Take 1 tablet (75 mg total) by mouth daily. 02/21/24   Schnier, Cordella [name], MD  metFORMIN (GLUCOPHAGE) 500 MG tablet Take 1 tablet  by mouth 2 (two) times daily with a meal. 06/15/20   [provider]  metoprolol  tartrate (LOPRESSOR ) 25 MG tablet Take 0.5 tablets (12.5 mg total) by mouth 2 (two) times daily. Patient not taking: Reported on 06/17/2024 08/13/20   Josette Ade, MD  Multiple Vitamins-Minerals (SENTRY PO) Take by mouth daily.    [provider]  pantoprazole  (PROTONIX ) 40 MG tablet Take 1 tablet (40 mg total) by mouth daily. Patient not taking: Reported on 06/17/2024 08/13/20 01/25/25  Josette Ade, MD  polyethylene glycol (MIRALAX ) 17 g packet  Take 17 g by mouth 2 (two) times daily. Patient not taking: Reported on 06/17/2024 08/14/20   Josette Ade, MD  polyethylene glycol powder (GLYCOLAX /MIRALAX ) 17 GM/SCOOP powder Take by mouth. Patient not taking: Reported on 06/17/2024 03/25/21   [provider]  potassium chloride  (KLOR-CON ) 10 MEQ tablet Take 10 mEq by mouth daily. 05/11/20   [provider]  timolol (TIMOPTIC) 0.5 % ophthalmic solution 1 drop 2 (two) times daily.    [provider]  triamterene-hydrochlorothiazide (MAXZIDE-25) 37.5-25 MG tablet Take 1 tablet by mouth daily. 10/07/23   [provider]    Social History   Socioeconomic History   Marital status: Divorced    Spouse name: Not on file   Number of children: Not on file   Years of education: Not on file   Highest education level: Not on file  Occupational History   Not on file  Tobacco Use   Smoking status: Former   Smokeless tobacco: Never  Vaping Use   Vaping status: Never Used  Substance and Sexual Activity   Alcohol use: No   Drug use: No   Sexual activity: Not Currently  Other Topics Concern   Not on file  Social History Narrative   There are several medical conditions patient states she has never had. This RN was deleting some when the patient asked me to stop so she could have list. The ones I deleted are AAA, arthritis, and anemia.   Social Drivers of Corporate Investment Banker Strain: Low Risk  (01/18/2023)   Received from [facility]   Overall Financial Resource Strain (CARDIA)    Difficulty of Paying Living Expenses: Not hard at all  Food Insecurity: No Food Insecurity (01/18/2023)   Received from [facility]   Hunger Vital Sign    Within the past 12 months, you worried that your food would run out before you got the money to buy more.: Never true    Within the past 12 months, the food you bought just didn't last and you didn't have money to get more.: Never true   Transportation Needs: No Transportation Needs (01/18/2023)   Received from [facility] - Transportation    In the past 12 months, has lack of transportation kept you from medical appointments or from getting medications?: No    Lack of Transportation (Non-Medical): No  Physical Activity: Not on file  Stress: Not on file  Social Connections: Not on file  Intimate Partner Violence: Not on file     Family History  Problem Relation Age of Onset   Hypertension Mother    Hyperlipidemia Mother    Hypertension Father    Coronary artery disease Brother    Breast cancer Neg Hx     ROS: Otherwise negative unless mentioned in HPI  Physical Examination  Vitals:   07/25/24 1250 07/25/24 1255  BP: (!) 84/70 ROLLEN)  81/62  Pulse: 94 94  Resp: 17 18  Temp:    SpO2: 98% 98%   Body mass index is 37.02 kg/m.  General:  WDWN in NAD Gait: Not observed HENT: WNL, normocephalic Pulmonary: normal non-labored breathing, without Rales, rhonchi,  wheezing Cardiac: regular, without  Murmurs, rubs or gallops; without carotid bruits Abdomen: Positive bowel sounds on auscultation, soft, NT/ND, no masses Skin: without rashes Vascular Exam/Pulses: Positive pulses throughout, weak bilateral lower extremity palpable pulses. Lower extremities are cool to touch. Decreased capillary refill.  Extremities: with ischemic changes, without Gangrene , without cellulitis; without open wounds;  Musculoskeletal: no muscle wasting or atrophy  Neurologic: A&O X 3;  No focal weakness or paresthesias are detected; speech is fluent/normal Psychiatric:  The pt has Normal affect. Lymph:  Unremarkable  CBC    Component Value Date/Time   WBC 9.8 07/25/2024 1236   RBC 5.25 (H) 07/25/2024 1236   HGB 13.5 07/25/2024 1236   HGB 11.9 (L) 08/22/2013 0455   HCT 42.9 07/25/2024 1236   HCT 43.8 08/07/2013 0925   PLT 283 07/25/2024 1236   PLT 217 08/22/2013 0455   MCV 81.7 07/25/2024 1236   MCV 78  (L) 08/07/2013 0925   MCH 25.7 (L) 07/25/2024 1236   MCHC 31.5 07/25/2024 1236   RDW 14.1 07/25/2024 1236   RDW 14.4 08/07/2013 0925   LYMPHSABS 2.0 08/13/2020 0527   MONOABS 0.5 08/13/2020 0527   EOSABS 0.4 08/13/2020 0527   BASOSABS 0.0 08/13/2020 0527    BMET    Component Value Date/Time   NA 143 08/12/2020 0619   NA 137 08/23/2013 0519   K 3.5 08/12/2020 0619   K 3.4 (L) 08/23/2013 0519   CL 111 08/12/2020 0619   CL 103 08/23/2013 0519   CO2 26 08/12/2020 0619   CO2 30 08/23/2013 0519   GLUCOSE 108 (H) 08/12/2020 0619   GLUCOSE 168 (H) 08/23/2013 0519   BUN 22 02/20/2024 1141   BUN 12 08/23/2013 0519   CREATININE 1.00 07/25/2024 1136   CREATININE 1.17 08/23/2013 0519   CALCIUM 7.7 (L) 08/12/2020 0619   CALCIUM 8.2 (L) 08/23/2013 0519   GFRNONAA 45 (L) 02/20/2024 1141   GFRNONAA 48 (L) 08/23/2013 0519   GFRAA 55 (L) 08/23/2013 0519    COAGS: Lab Results  Component Value Date   INR 1.0 08/09/2020   INR 1.0 08/07/2013     Non-Invasive Vascular Imaging:   EXAM:07/25/2024 CT ABDOMEN AND PELVIS WITH CONTRAST   TECHNIQUE: Multidetector CT imaging of the abdomen and pelvis was performed using the standard protocol following bolus administration of intravenous contrast.   RADIATION DOSE REDUCTION: This exam was performed according to the departmental dose-optimization program which includes automated exposure control, adjustment of the mA and/or kV according to patient size and/or use of iterative reconstruction technique.   CONTRAST:  OMNIPAQUE  IOHEXOL  300 MG/ML  SOLN   COMPARISON:  CTA abdomen pelvis 08/10/2020   FINDINGS: Lower chest: No acute abnormality.   Hepatobiliary: No focal liver abnormality is seen. No gallstones, gallbladder wall thickening, or biliary dilatation.   Pancreas: Unremarkable. No pancreatic ductal dilatation or surrounding inflammatory changes.   Spleen: Normal in size without focal abnormality.   Adrenals/Urinary  Tract: Adrenal glands are unremarkable. Kidneys are normal, without renal calculi, focal lesion, or hydronephrosis. Bladder is unremarkable.   Stomach/Bowel: Bowel shows no evidence of obstruction, ileus, inflammation or lesion. The appendix is normal. No free intraperitoneal air.   Vascular/Lymphatic: No lymphadenopathy.   Significant  interval enlargement infrarenal abdominal aortic aneurysm now measuring approximately 5.7 x 6.9 cm in maximum transverse dimensions with maximum oblique diameter of 7.0 cm. There is evidence to suggest acute focal leak from the right anterior aspect of the aneurysm with retroperitoneal stranding anterior to the aneurysm and to the right of the aneurysm also abutting the posterior duodenum and anterior aspect of the IVC. No dissection. Mural thrombus present in the aneurysm sac.   Interval enlargement proximal right common iliac artery aneurysm now measuring up to 3.5 cm.   Reproductive: Status post hysterectomy. No adnexal masses.   Other: Umbilical hernia containing fat.   Musculoskeletal: No acute or significant osseous findings.   IMPRESSION: 1. Interval enlargement of infrarenal abdominal aortic aneurysm now measuring up to 7 cm in greatest diameter. Evidence of acute focal retroperitoneal stranding anterior and to the right of the aneurysm consistent with focal hemorrhage/leak from the aneurysm. 2. Interval enlargement of right common iliac artery aneurysm now measuring up to 3.5 cm. 3. These results were called to the ordering clinician emergently by the CT Department and the patient will be transported to the Emergency Department.  Statin:  Yes.   Beta Blocker:  Yes.   Aspirin :  Yes.   ACEI:  No. ARB:  No. CCB use:  Yes Other antiplatelets/anticoagulants:  Yes.   Plavix  75 mg Daily    ASSESSMENT/PLAN: This is a 78 y.o. female who presents to [facility] emergency department with 2 days of worsening abdominal pain. Patient endorses pain  started 2 weeks ago but got progressively worse fast over the past 2 days. Upon work up she is noted to have a 7.0 CM AAA on CT abdomen and Pelvis. She is also noted to have a right iliac aneurysm of 3.5 cm.   Vascular surgery has seen the patient emergently in the emergency room and upon evaluation the patient needs to be taken to the vascular lab for AAA repair. I received consent from the patients granddaughter at the bedside to proceed with Endovascular AAA repair. Patient will be taken to the vascular lab immediately. This is an emergent case.   I discussed in detail with the patient and her grand daughter the procedure, benefits, risks and complications. Both the patient and the grand daughter agree to proceed. I answered all there questions. Patient has been on ASA 81 mg daily and Plavix  75 mg daily.       -I discussed the case in detail with Dr Cordella Shawl MD and he agrees with the plan.    [name] [facility] 07/25/2024 1:01 PM

## 2024-07-25 NOTE — H&P (View-Only) (Signed)
 Hospital Consult    Reason for Consult:  Ruptured AAA Requesting Physician:  Dr Linda Budge MD  MRN #:  [id number]  History of Present Illness: This is a 78 y.o. female who presents to [facility] emergency department with 2 days of bad abdominal pain. Patient endorses it started 2 weeks ago and has progressively gotten worse.   Upon work up patient underwent Ct of the abdomen and pelvis showing AAA with measuring up to 7 CM's. There is also a right iliac aneurysm measuring 3.5 cm. Vascular Surgery consulted to evaluate in and emergent situation.   Past Medical History:  Diagnosis Date   Celiac artery stenosis    CME (cystoid macular edema)    right   CNVM (choroidal neovascular membrane)    Diabetes mellitus without complication (HCC)    Diverticulosis of colon    Elevated lipids    History of cataract    History of rectal bleeding    Hx of adenomatous colonic polyps    Hyperlipidemia    Hypertension    Hypertensive retinopathy of both eyes    Lymphedema    Primary open angle glaucoma (POAG) of both eyes     Past Surgical History:  Procedure Laterality Date   ABDOMINAL HYSTERECTOMY     COLONOSCOPY     COLONOSCOPY WITH PROPOFOL  N/A 08/22/2016   Procedure: COLONOSCOPY WITH PROPOFOL ;  Surgeon: Lamar ONEIDA Holmes, MD;  Location: [facility] ENDOSCOPY;  Service: Endoscopy;  Laterality: N/A;   COLONOSCOPY WITH PROPOFOL  N/A 08/11/2020   Procedure: COLONOSCOPY WITH PROPOFOL ;  Surgeon: Maryruth Ole ONEIDA, MD;  Location: [facility] ENDOSCOPY;  Service: Endoscopy;  Laterality: N/A;   COLONOSCOPY WITH PROPOFOL  N/A 11/12/2021   Procedure: COLONOSCOPY WITH PROPOFOL ;  Surgeon: Maryruth Ole ONEIDA, MD;  Location: [facility] ENDOSCOPY;  Service: Endoscopy;  Laterality: N/A;  DM   COLONOSCOPY WITH PROPOFOL  N/A 08/18/2023   Procedure: COLONOSCOPY WITH PROPOFOL ;  Surgeon: Maryruth Ole ONEIDA, MD;  Location: [facility] ENDOSCOPY;  Service: Endoscopy;  Laterality: N/A;   ESOPHAGOGASTRODUODENOSCOPY (EGD) WITH PROPOFOL  N/A  08/11/2020   Procedure: ESOPHAGOGASTRODUODENOSCOPY (EGD) WITH PROPOFOL ;  Surgeon: Maryruth Ole ONEIDA, MD;  Location: [facility] ENDOSCOPY;  Service: Endoscopy;  Laterality: N/A;   EYE SURGERY     JOINT REPLACEMENT Left    left total knee replacement   LOWER EXTREMITY ANGIOGRAPHY Right 02/20/2024   Procedure: Lower Extremity Angiography;  Surgeon: Jama Cordella [name], MD;  Location: [facility] INVASIVE CV LAB;  Service: Cardiovascular;  Laterality: Right;   ltkr     needle in foot removed     POLYPECTOMY  08/18/2023   Procedure: POLYPECTOMY;  Surgeon: Maryruth Ole ONEIDA, MD;  Location: [facility] ENDOSCOPY;  Service: Endoscopy;;   REFRACTIVE SURGERY      Allergies  Allergen Reactions   Codeine     Prior to Admission medications   Medication Sig Start Date End Date Taking? Authorizing Provider  amLODipine (NORVASC) 5 MG tablet Take 1 tablet by mouth daily. 10/07/20   [provider]  aspirin  EC 81 MG tablet Take 1 tablet (81 mg total) by mouth daily. Swallow whole. 02/21/24 02/20/25  Schnier, Cordella [name], MD  atorvastatin (LIPITOR) 20 MG tablet Take 20 mg by mouth daily.    [provider]  atorvastatin (LIPITOR) 20 MG tablet Take 1 tablet by mouth daily. 05/10/21   [provider]  clopidogrel  (PLAVIX ) 75 MG tablet Take 1 tablet (75 mg total) by mouth daily. 02/21/24   Schnier, Cordella [name], MD  metFORMIN (GLUCOPHAGE) 500 MG tablet Take 1 tablet  by mouth 2 (two) times daily with a meal. 06/15/20   [provider]  metoprolol  tartrate (LOPRESSOR ) 25 MG tablet Take 0.5 tablets (12.5 mg total) by mouth 2 (two) times daily. Patient not taking: Reported on 06/17/2024 08/13/20   Josette Ade, MD  Multiple Vitamins-Minerals (SENTRY PO) Take by mouth daily.    [provider]  pantoprazole  (PROTONIX ) 40 MG tablet Take 1 tablet (40 mg total) by mouth daily. Patient not taking: Reported on 06/17/2024 08/13/20 01/25/25  Josette Ade, MD  polyethylene glycol (MIRALAX ) 17 g packet  Take 17 g by mouth 2 (two) times daily. Patient not taking: Reported on 06/17/2024 08/14/20   Josette Ade, MD  polyethylene glycol powder (GLYCOLAX /MIRALAX ) 17 GM/SCOOP powder Take by mouth. Patient not taking: Reported on 06/17/2024 03/25/21   [provider]  potassium chloride  (KLOR-CON ) 10 MEQ tablet Take 10 mEq by mouth daily. 05/11/20   [provider]  timolol (TIMOPTIC) 0.5 % ophthalmic solution 1 drop 2 (two) times daily.    [provider]  triamterene-hydrochlorothiazide (MAXZIDE-25) 37.5-25 MG tablet Take 1 tablet by mouth daily. 10/07/23   [provider]    Social History   Socioeconomic History   Marital status: Divorced    Spouse name: Not on file   Number of children: Not on file   Years of education: Not on file   Highest education level: Not on file  Occupational History   Not on file  Tobacco Use   Smoking status: Former   Smokeless tobacco: Never  Vaping Use   Vaping status: Never Used  Substance and Sexual Activity   Alcohol use: No   Drug use: No   Sexual activity: Not Currently  Other Topics Concern   Not on file  Social History Narrative   There are several medical conditions patient states she has never had. This RN was deleting some when the patient asked me to stop so she could have list. The ones I deleted are AAA, arthritis, and anemia.   Social Drivers of Corporate Investment Banker Strain: Low Risk  (01/18/2023)   Received from [facility]   Overall Financial Resource Strain (CARDIA)    Difficulty of Paying Living Expenses: Not hard at all  Food Insecurity: No Food Insecurity (01/18/2023)   Received from [facility]   Hunger Vital Sign    Within the past 12 months, you worried that your food would run out before you got the money to buy more.: Never true    Within the past 12 months, the food you bought just didn't last and you didn't have money to get more.: Never true   Transportation Needs: No Transportation Needs (01/18/2023)   Received from [facility] - Transportation    In the past 12 months, has lack of transportation kept you from medical appointments or from getting medications?: No    Lack of Transportation (Non-Medical): No  Physical Activity: Not on file  Stress: Not on file  Social Connections: Not on file  Intimate Partner Violence: Not on file     Family History  Problem Relation Age of Onset   Hypertension Mother    Hyperlipidemia Mother    Hypertension Father    Coronary artery disease Brother    Breast cancer Neg Hx     ROS: Otherwise negative unless mentioned in HPI  Physical Examination  Vitals:   07/25/24 1250 07/25/24 1255  BP: (!) 84/70 ROLLEN)  81/62  Pulse: 94 94  Resp: 17 18  Temp:    SpO2: 98% 98%   Body mass index is 37.02 kg/m.  General:  WDWN in NAD Gait: Not observed HENT: WNL, normocephalic Pulmonary: normal non-labored breathing, without Rales, rhonchi,  wheezing Cardiac: regular, without  Murmurs, rubs or gallops; without carotid bruits Abdomen: Positive bowel sounds on auscultation, soft, NT/ND, no masses Skin: without rashes Vascular Exam/Pulses: Positive pulses throughout, weak bilateral lower extremity palpable pulses. Lower extremities are cool to touch. Decreased capillary refill.  Extremities: with ischemic changes, without Gangrene , without cellulitis; without open wounds;  Musculoskeletal: no muscle wasting or atrophy  Neurologic: A&O X 3;  No focal weakness or paresthesias are detected; speech is fluent/normal Psychiatric:  The pt has Normal affect. Lymph:  Unremarkable  CBC    Component Value Date/Time   WBC 9.8 07/25/2024 1236   RBC 5.25 (H) 07/25/2024 1236   HGB 13.5 07/25/2024 1236   HGB 11.9 (L) 08/22/2013 0455   HCT 42.9 07/25/2024 1236   HCT 43.8 08/07/2013 0925   PLT 283 07/25/2024 1236   PLT 217 08/22/2013 0455   MCV 81.7 07/25/2024 1236   MCV 78  (L) 08/07/2013 0925   MCH 25.7 (L) 07/25/2024 1236   MCHC 31.5 07/25/2024 1236   RDW 14.1 07/25/2024 1236   RDW 14.4 08/07/2013 0925   LYMPHSABS 2.0 08/13/2020 0527   MONOABS 0.5 08/13/2020 0527   EOSABS 0.4 08/13/2020 0527   BASOSABS 0.0 08/13/2020 0527    BMET    Component Value Date/Time   NA 143 08/12/2020 0619   NA 137 08/23/2013 0519   K 3.5 08/12/2020 0619   K 3.4 (L) 08/23/2013 0519   CL 111 08/12/2020 0619   CL 103 08/23/2013 0519   CO2 26 08/12/2020 0619   CO2 30 08/23/2013 0519   GLUCOSE 108 (H) 08/12/2020 0619   GLUCOSE 168 (H) 08/23/2013 0519   BUN 22 02/20/2024 1141   BUN 12 08/23/2013 0519   CREATININE 1.00 07/25/2024 1136   CREATININE 1.17 08/23/2013 0519   CALCIUM 7.7 (L) 08/12/2020 0619   CALCIUM 8.2 (L) 08/23/2013 0519   GFRNONAA 45 (L) 02/20/2024 1141   GFRNONAA 48 (L) 08/23/2013 0519   GFRAA 55 (L) 08/23/2013 0519    COAGS: Lab Results  Component Value Date   INR 1.0 08/09/2020   INR 1.0 08/07/2013     Non-Invasive Vascular Imaging:   EXAM:07/25/2024 CT ABDOMEN AND PELVIS WITH CONTRAST   TECHNIQUE: Multidetector CT imaging of the abdomen and pelvis was performed using the standard protocol following bolus administration of intravenous contrast.   RADIATION DOSE REDUCTION: This exam was performed according to the departmental dose-optimization program which includes automated exposure control, adjustment of the mA and/or kV according to patient size and/or use of iterative reconstruction technique.   CONTRAST:  OMNIPAQUE  IOHEXOL  300 MG/ML  SOLN   COMPARISON:  CTA abdomen pelvis 08/10/2020   FINDINGS: Lower chest: No acute abnormality.   Hepatobiliary: No focal liver abnormality is seen. No gallstones, gallbladder wall thickening, or biliary dilatation.   Pancreas: Unremarkable. No pancreatic ductal dilatation or surrounding inflammatory changes.   Spleen: Normal in size without focal abnormality.   Adrenals/Urinary  Tract: Adrenal glands are unremarkable. Kidneys are normal, without renal calculi, focal lesion, or hydronephrosis. Bladder is unremarkable.   Stomach/Bowel: Bowel shows no evidence of obstruction, ileus, inflammation or lesion. The appendix is normal. No free intraperitoneal air.   Vascular/Lymphatic: No lymphadenopathy.   Significant  interval enlargement infrarenal abdominal aortic aneurysm now measuring approximately 5.7 x 6.9 cm in maximum transverse dimensions with maximum oblique diameter of 7.0 cm. There is evidence to suggest acute focal leak from the right anterior aspect of the aneurysm with retroperitoneal stranding anterior to the aneurysm and to the right of the aneurysm also abutting the posterior duodenum and anterior aspect of the IVC. No dissection. Mural thrombus present in the aneurysm sac.   Interval enlargement proximal right common iliac artery aneurysm now measuring up to 3.5 cm.   Reproductive: Status post hysterectomy. No adnexal masses.   Other: Umbilical hernia containing fat.   Musculoskeletal: No acute or significant osseous findings.   IMPRESSION: 1. Interval enlargement of infrarenal abdominal aortic aneurysm now measuring up to 7 cm in greatest diameter. Evidence of acute focal retroperitoneal stranding anterior and to the right of the aneurysm consistent with focal hemorrhage/leak from the aneurysm. 2. Interval enlargement of right common iliac artery aneurysm now measuring up to 3.5 cm. 3. These results were called to the ordering clinician emergently by the CT Department and the patient will be transported to the Emergency Department.  Statin:  Yes.   Beta Blocker:  Yes.   Aspirin :  Yes.   ACEI:  No. ARB:  No. CCB use:  Yes Other antiplatelets/anticoagulants:  Yes.   Plavix  75 mg Daily    ASSESSMENT/PLAN: This is a 78 y.o. female who presents to [facility] emergency department with 2 days of worsening abdominal pain. Patient endorses pain  started 2 weeks ago but got progressively worse fast over the past 2 days. Upon work up she is noted to have a 7.0 CM AAA on CT abdomen and Pelvis. She is also noted to have a right iliac aneurysm of 3.5 cm.   Vascular surgery has seen the patient emergently in the emergency room and upon evaluation the patient needs to be taken to the vascular lab for AAA repair. I received consent from the patients granddaughter at the bedside to proceed with Endovascular AAA repair. Patient will be taken to the vascular lab immediately. This is an emergent case.   I discussed in detail with the patient and her grand daughter the procedure, benefits, risks and complications. Both the patient and the grand daughter agree to proceed. I answered all there questions. Patient has been on ASA 81 mg daily and Plavix  75 mg daily.       -I discussed the case in detail with Dr Cordella Shawl MD and he agrees with the plan.    [name] [facility] 07/25/2024 1:01 PM

## 2024-07-25 NOTE — Anesthesia Procedure Notes (Signed)
 Procedure Name: Intubation Date/Time: 07/25/2024 1:11 PM  Performed by: Stevan Fairy POUR, MDPre-anesthesia Checklist: Patient identified, Patient being monitored, Timeout performed, Emergency Drugs available and Suction available Patient Re-evaluated:Patient Re-evaluated prior to induction Oxygen Delivery Method: Circle system utilized Preoxygenation: Pre-oxygenation with 100% oxygen Induction Type: IV induction Ventilation: Mask ventilation without difficulty Laryngoscope Size: Mac and 3 Grade View: Grade I Tube type: Oral Tube size: 7.0 mm Number of attempts: 1 Airway Equipment and Method: Stylet Placement Confirmation: ETT inserted through vocal cords under direct vision, positive ETCO2 and breath sounds checked- equal and bilateral Secured at: 21 cm Tube secured with: Tape Dental Injury: Teeth and Oropharynx as per pre-operative assessment

## 2024-07-25 NOTE — Plan of Care (Signed)
  Problem: Education: Goal: Knowledge of General Education information will improve Description: Including pain rating scale, medication(s)/side effects and non-pharmacologic comfort measures Outcome: Progressing   Problem: Health Behavior/Discharge Planning: Goal: Ability to manage health-related needs will improve Outcome: Progressing   Problem: Clinical Measurements: Goal: Ability to maintain clinical measurements within normal limits will improve Outcome: Progressing Goal: Will remain free from infection Outcome: Progressing Goal: Diagnostic test results will improve Outcome: Progressing Goal: Respiratory complications will improve Outcome: Progressing Goal: Cardiovascular complication will be avoided Outcome: Progressing   Problem: Activity: Goal: Risk for activity intolerance will decrease Outcome: Progressing   Problem: Nutrition: Goal: Adequate nutrition will be maintained Outcome: Progressing   Problem: Coping: Goal: Level of anxiety will decrease Outcome: Progressing   Problem: Elimination: Goal: Will not experience complications related to bowel motility Outcome: Progressing Goal: Will not experience complications related to urinary retention Outcome: Progressing   Problem: Pain Managment: Goal: General experience of comfort will improve and/or be controlled Outcome: Progressing   Problem: Safety: Goal: Ability to remain free from injury will improve Outcome: Progressing   Problem: Skin Integrity: Goal: Risk for impaired skin integrity will decrease Outcome: Progressing   Problem: Education: Goal: Ability to describe self-care measures that may prevent or decrease complications (Diabetes Survival Skills Education) will improve Outcome: Progressing Goal: Individualized Educational Video(s) Outcome: Progressing   Problem: Coping: Goal: Ability to adjust to condition or change in health will improve Outcome: Progressing   Problem: Fluid  Volume: Goal: Ability to maintain a balanced intake and output will improve Outcome: Progressing   Problem: Health Behavior/Discharge Planning: Goal: Ability to identify and utilize available resources and services will improve Outcome: Progressing Goal: Ability to manage health-related needs will improve Outcome: Progressing   Problem: Metabolic: Goal: Ability to maintain appropriate glucose levels will improve Outcome: Progressing   Problem: Nutritional: Goal: Maintenance of adequate nutrition will improve Outcome: Progressing Goal: Progress toward achieving an optimal weight will improve Outcome: Progressing   Problem: Skin Integrity: Goal: Risk for impaired skin integrity will decrease Outcome: Progressing   Problem: Tissue Perfusion: Goal: Adequacy of tissue perfusion will improve Outcome: Progressing   Problem: Education: Goal: Knowledge of discharge needs will improve Outcome: Progressing   Problem: Clinical Measurements: Goal: Postoperative complications will be avoided or minimized Outcome: Progressing   Problem: Respiratory: Goal: Will achieve and/or maintain a regular respiratory rate, without signs or symptoms of dyspnea Outcome: Progressing   Problem: Skin Integrity: Goal: Demonstration of wound healing without infection will improve Outcome: Progressing

## 2024-07-25 NOTE — Progress Notes (Signed)
 Massive infusion protocol was initiated during admission to the ICU. The patient has received 1 unit of pack red blood cells, 1 unit of platelets, 2 units of plasma. Night shift RN will provide the patient will 2 units of creo. Labs to be rechecked after the patient has received all of the order blood products. 4 mg of calcium have been given. The patient is following commands at this time by moving both upper and lower extremities. Levophed is currently infusing.   Blood pressure is being monitored through arterial line. The patient is currently on a ventilator. The patient has a foley catheter in place. The patient is hypothermic at this time.    The patient received 6 units of blood during her procedure, 1 unit of platelets, 3 units of plasma.

## 2024-07-25 NOTE — H&P (Signed)
 NAME:  Rose Perry, MRN:  [id number], DOB:  December 22, 1945, LOS: 0 ADMISSION DATE:  07/25/2024, CONSULTATION DATE:  07/25/24 REFERRING MD:  Dr. Cordella Shawl, CHIEF COMPLAINT:  Abdominal pain   History of Present Illness:  [name] is a 78 y.o. female who presented to [facility] ED from [facility] walk-in clinic for worsening abdominal pain x 2 days, to which a STAT Abd/Pelvis CT was ordered, resulting with concern for possible ruptured aneurysm. The patient was transported to [facility] ED for further workup. She reported the abdominal pain has been present for 2 weeks but significantly increased over the past two days. Denied shortness of breath and chest pain.  ED Course: Vitals on arrival: Temp 97.8, BP 149/65, HR 74, RR 15, SpO2 95%  Medications Administered: Cefazolin  preoperatively Morphine  NS bolus Zofran  NS gtt  Pertinent Labs/Diagnostics: Initial CBC unremarkable BMP unremarkable Troponin: 20 Lipase 17 11/13 CT abdomen/pelvis: AAA measuring 7cm, as well as a right iliac aneurysm measuring 3.5cm.   Vascular surgery emergently consulted. Upon further evaluation they decided to take her to the vascular lab for AAA repair. While waiting to go to the OR, she developed severe hypotension (80/69) and tachycardia requiring initiating of norepinephrine, as well as IVF resuscitation. She was emergently taken to the vascular lab, with worsening hypotension requiring phenylephrine , epinephrine  and vasopressin. Massive Transfusion Protocol was initiated and she was mechanically intubated. A total of 6mg  Versed  was given, 3 grams on albumin, calcium chloride 0.25g, 6units pRBC, 2 units of FFP and 1 unit of platelets. Repeat Hgb was 6.9.   PCCM was consulted for ICU admission due to hypotension requiring vasopressors and mechanical ventilation.  Pertinent  Medical History  Hypertension Type II Diabetes Mellitus Hyperlipidemia Celiac Artery Stenosis  Significant Hospital  Events: Including procedures, antibiotic start and stop dates in addition to other pertinent events   11/13: CT Abd/pelvis identified 7mm AAA concern for rupture, pt decompensated and was emergently taken to the vascular lab. Placed on levo, later requiring vaso, neo and epi during the procedure. MTP initiated, repeat Hgb was 6.9 (previous 13.5). Admitted to the ICU for mechanical ventilation management and hypotension requiring vasopressors. Started on fentanyl  gtt.   Interim History / Subjective:  See above listed under Significant Hospital Events.  Objective    Blood pressure (!) 81/62, pulse 94, temperature 97.8 F (36.6 C), temperature source Oral, resp. rate 18, height 5' 7 (1.702 m), weight 107.2 kg, SpO2 98%.        Intake/Output Summary (Last 24 hours) at 07/25/2024 1533 Last data filed at 07/25/2024 1454 Gross per 24 hour  Intake 8513 ml  Output 550 ml  Net 7963 ml   Filed Weights   07/25/24 1210  Weight: 107.2 kg    Examination: General: sedated and intubated, in NAD HENT: atraumatic, normocephalic, supple, no JVD Lungs: clear to auscultation bilaterally, normal effort, non-labored Cardiovascular: NSR, S1 S2, no m/r/g Abdomen: soft, mild distention, no ttp/rebound/guarding, bowel sounds x 4 Extremities: cool, dry, intact, radial pulses 2+, distal pulses 1+, trace edema Neuro: sedated and intubated, no focal neuro deficits, PERRL GU: deferred  Resolved problem list   Assessment and Plan   #Sedation in the setting of Mechanical Ventilation - PAD protocol - Maintain RASS goal 0 to -1 - Daily WUA as able - Continue fentanyl  gtt  #Hemorrhagic Shock s/t AAA Rupture s/p Stent Placement (11/13) Hx: Hypertension, HLD, Celiac Artery Stenosis - Continuous cardiac monitoring - Vasopressors to maintain MAP goal >65 ~ currently on  levo - Wean levophed as able - Continue NS gtt - Vascular surgery following; appreciate input - Intermittent EKG prn - Left Radial  Arterial Line for accurate BP measurements  #Mechanical Intubation for Airway Protection Intraoperatively & Metabolic Acidosis - Full vent support - FiO2 and PEEP to maintain O2 sats >92% - Wean as able - SBT when respiratory and mental status parameters met - Lung protective measures - VAP bundle - Maintain plateau pressure less than 30cm H2O - Bronchodilators PRN - Intermittent CXR and ABG - ABG: ph 7.25, pCO2 46, pO2 227, bicarb 20.2, acid-base deficits 7.0  #Metabolic Acidosis s/t Hemorrhagic Shock iso AAA Rupture #Hypocalcemia - Strict I/O - Avoid nephrotoxins as able - Ensure adequate renal perfusion - Trend BMP and monitor renal function - ICU electrolyte replacement protocol - Pharmacy to assist with replacement - Ca 6.4, replaced with 2g Calcium Gluconate x 2 - Trend lactic  #GI Prophylaxis - Continue Protonix  40mg  - Diet: NPO - Constipation protocol  #Acute Blood Loss Anemia s/t AAA Rupture - Trend CBC - Transfuse if Hgb <7 - Most recent Hgb 6.9 - Will repeat labs after finishing blood products - Once HD stable, will monitor Hgb Q4h x 2 - Monitor coags; aPTT >200 - DIC workup: Fibrinogen 97; D-dimer 3.86 - Smear: negative for shistocytes - 2 units cryoprecipitate - Continue MTP - Received 7 units pRBCs, 2 units FFP and 1 unit of platelets in OR  #Type II Diabetes Mellitus - ICU hypo/hyperglycemia protocol - SSI as indicated: Novolog  - CBG Q4h   11/13: Updated granddaughter and son in conference room regarding the patient's current state. Answered all questions from family.  Labs   CBC: Recent Labs  Lab 07/25/24 1236 07/25/24 1419  WBC 9.8 9.1  HGB 13.5 6.9*  HCT 42.9 21.5*  MCV 81.7 86.7  PLT 283 125*  128*    Basic Metabolic Panel: Recent Labs  Lab 07/25/24 1136 07/25/24 1236  NA  --  139  K  --  3.6  CL  --  99  CO2  --  27  GLUCOSE  --  136*  BUN  --  16  CREATININE 1.00 0.90  CALCIUM  --  9.7   GFR: Estimated Creatinine  Clearance: 63.9 mL/min (by C-G formula based on SCr of 0.9 mg/dL). Recent Labs  Lab 07/25/24 1236 07/25/24 1419  WBC 9.8 9.1    Liver Function Tests: Recent Labs  Lab 07/25/24 1236  AST 20  ALT 14  ALKPHOS 83  BILITOT 0.4  PROT 7.2  ALBUMIN 4.0   Recent Labs  Lab 07/25/24 1236  LIPASE 17   No results for input(s): AMMONIA in the last 168 hours.  ABG No results found for: PHART, PCO2ART, PO2ART, HCO3, TCO2, ACIDBASEDEF, O2SAT   Coagulation Profile: Recent Labs  Lab 07/25/24 1419  INR PENDING    Cardiac Enzymes: No results for input(s): CKTOTAL, CKMB, CKMBINDEX, TROPONINI in the last 168 hours.  HbA1C: Hgb A1c MFr Bld  Date/Time Value Ref Range Status  08/09/2020 09:19 AM 6.5 (H) 4.8 - 5.6 % Final    Comment:    (NOTE) Pre diabetes:          5.7%-6.4%  Diabetes:              >6.4%  Glycemic control for   <7.0% adults with diabetes     CBG: No results for input(s): GLUCAP in the last 168 hours.  Review of Systems:   Unable to assess as  pt is intubated and sedated  Past Medical History:  She,  has a past medical history of Celiac artery stenosis, CME (cystoid macular edema), CNVM (choroidal neovascular membrane), Diabetes mellitus without complication (HCC), Diverticulosis of colon, Elevated lipids, History of cataract, History of rectal bleeding, adenomatous colonic polyps, Hyperlipidemia, Hypertension, Hypertensive retinopathy of both eyes, Lymphedema, and Primary open angle glaucoma (POAG) of both eyes.   Surgical History:   Past Surgical History:  Procedure Laterality Date   ABDOMINAL HYSTERECTOMY     COLONOSCOPY     COLONOSCOPY WITH PROPOFOL  N/A 08/22/2016   Procedure: COLONOSCOPY WITH PROPOFOL ;  Surgeon: Lamar ONEIDA Holmes, MD;  Location: [facility] ENDOSCOPY;  Service: Endoscopy;  Laterality: N/A;   COLONOSCOPY WITH PROPOFOL  N/A 08/11/2020   Procedure: COLONOSCOPY WITH PROPOFOL ;  Surgeon: Maryruth Ole ONEIDA, MD;  Location:  [facility] ENDOSCOPY;  Service: Endoscopy;  Laterality: N/A;   COLONOSCOPY WITH PROPOFOL  N/A 11/12/2021   Procedure: COLONOSCOPY WITH PROPOFOL ;  Surgeon: Maryruth Ole ONEIDA, MD;  Location: [facility] ENDOSCOPY;  Service: Endoscopy;  Laterality: N/A;  DM   COLONOSCOPY WITH PROPOFOL  N/A 08/18/2023   Procedure: COLONOSCOPY WITH PROPOFOL ;  Surgeon: Maryruth Ole ONEIDA, MD;  Location: [facility] ENDOSCOPY;  Service: Endoscopy;  Laterality: N/A;   ESOPHAGOGASTRODUODENOSCOPY (EGD) WITH PROPOFOL  N/A 08/11/2020   Procedure: ESOPHAGOGASTRODUODENOSCOPY (EGD) WITH PROPOFOL ;  Surgeon: Maryruth Ole ONEIDA, MD;  Location: [facility] ENDOSCOPY;  Service: Endoscopy;  Laterality: N/A;   EYE SURGERY     JOINT REPLACEMENT Left    left total knee replacement   LOWER EXTREMITY ANGIOGRAPHY Right 02/20/2024   Procedure: Lower Extremity Angiography;  Surgeon: Jama Cordella [name], MD;  Location: [facility] INVASIVE CV LAB;  Service: Cardiovascular;  Laterality: Right;   ltkr     needle in foot removed     POLYPECTOMY  08/18/2023   Procedure: POLYPECTOMY;  Surgeon: Maryruth Ole ONEIDA, MD;  Location: [facility] ENDOSCOPY;  Service: Endoscopy;;   REFRACTIVE SURGERY       Social History:   reports that she has quit smoking. She has never used smokeless tobacco. She reports that she does not drink alcohol and does not use drugs.   Family History:  Her family history includes Coronary artery disease in her brother; Hyperlipidemia in her mother; Hypertension in her father and mother. There is no history of Breast cancer.   Allergies Allergies  Allergen Reactions   Codeine      Home Medications  Prior to Admission medications   Medication Sig Start Date End Date Taking? Authorizing Provider  amLODipine (NORVASC) 5 MG tablet Take 1 tablet by mouth daily. 10/07/20   [provider]  aspirin  EC 81 MG tablet Take 1 tablet (81 mg total) by mouth daily. Swallow whole. 02/21/24 02/20/25  Schnier, Cordella [name], MD  atorvastatin (LIPITOR) 20 MG tablet Take  20 mg by mouth daily.    [provider]  atorvastatin (LIPITOR) 20 MG tablet Take 1 tablet by mouth daily. 05/10/21   [provider]  clopidogrel  (PLAVIX ) 75 MG tablet Take 1 tablet (75 mg total) by mouth daily. 02/21/24   Schnier, Gregory G, MD  metFORMIN (GLUCOPHAGE) 500 MG tablet Take 1 tablet by mouth 2 (two) times daily with a meal. 06/15/20   [provider]  metoprolol  tartrate (LOPRESSOR ) 25 MG tablet Take 0.5 tablets (12.5 mg total) by mouth 2 (two) times daily. Patient not taking: Reported on 06/17/2024 08/13/20   Josette Ade, MD  Multiple Vitamins-Minerals (SENTRY PO) Take by mouth daily.    [provider]  pantoprazole  (PROTONIX ) 40 MG tablet Take 1 tablet (40 mg total) by mouth daily. Patient not taking: Reported on 06/17/2024 08/13/20 01/25/25  Josette Ade, MD  polyethylene glycol (MIRALAX ) 17 g packet Take 17 g by mouth 2 (two) times daily. Patient not taking: Reported on 06/17/2024 08/14/20   Josette Ade, MD  polyethylene glycol powder (GLYCOLAX /MIRALAX ) 17 GM/SCOOP powder Take by mouth. Patient not taking: Reported on 06/17/2024 03/25/21   [provider]  potassium chloride  (KLOR-CON ) 10 MEQ tablet Take 10 mEq by mouth daily. 05/11/20   [provider]  timolol (TIMOPTIC) 0.5 % ophthalmic solution 1 drop 2 (two) times daily.    [provider]  triamterene-hydrochlorothiazide (MAXZIDE-25) 37.5-25 MG tablet Take 1 tablet by mouth daily. 10/07/23   [provider]     Critical care time: 65 minutes      Timmothy Baranowski, PA-C [facility] P[facility]M Team Contact Info: [phone]

## 2024-07-25 NOTE — Op Note (Signed)
 OPERATIVE NOTE   PROCEDURE: US  guidance for vascular access, bilateral femoral arteries Catheter placement into aorta from bilateral femoral approaches Catheter placement into the right internal iliac artery from the left common femoral approach third order catheter placement Coil embolization of the right internal iliac artery for treatment of the right common iliac artery aneurysm. Placement of a 28 x 14 x 12 C3 conformable Gore Excluder Endoprosthesis main body with a 16 x 10 left iliac extension limb with a 12 x 14 contralateral limb that extends into the right external iliac artery for treatment of the common iliac artery aneurysm Placement of a 28 mm proximal aortic extension cuff Selective catheterization of the left renal artery first-order catheter placement. Placement of a 7 mm x 26 mm Lifestream stent left renal artery ProGlide closure devices bilateral femoral arteries  PRE-OPERATIVE DIAGNOSIS: Ruptured juxtarenal AAA with hemodynamic shock  POST-OPERATIVE DIAGNOSIS: same  SURGEON: Cordella Shawl, MD and Selinda Gu, MD - Co-surgeons  ANESTHESIA: general  ESTIMATED BLOOD LOSS: 50 cc  FINDING(S): 1.  Ruptured juxtarenal AAA with a large right common iliac artery aneurysm  SPECIMEN(S):  none  INDICATIONS:   Rose Perry is a 78 y.o. y.o. female who presents with presents with an abdominal aortic aneurysm that is greater than 5 cm placing him at risk for lethal rupture.  The anatomy is suitable for endovascular repair.  Risks and benefits for repair of the abdominal aortic aneurysm using an endograft was described in detail to the patient all questions have been answered patient agrees to proceed.  Co-surgeons are utilized to expedite the procedure and reduce operative time improving patient's safety and improving outcome.  DESCRIPTION: After obtaining full informed written consent, the patient was brought back to the operating room and placed supine upon the operating  table.  The patient received IV antibiotics prior to induction.  After obtaining adequate anesthesia, the patient was prepped and draped in the standard fashion for endovascular AAA repair.  Co-surgeons are required because this is a complex bilateral procedure with work being performed simultaneously from both the right femoral and left femoral approach.  This also expedites the procedure making a shorter operative time reducing complications and improving patient safety.  We then began by gaining access to both femoral arteries with US  guidance with me working on the patient's right patient's left and Dr. Gu working on the patient's left.  The femoral arteries were found to be patent and accessed without difficulty with a needle under ultrasound guidance without difficulty on each side and permanent images were recorded.  We then placed 2 proglide devices on each side in a pre-close fashion and placed 8 French sheaths.  The right 8 French sheath was then upsized over an Amplatz wire to a 12 French sheath and a Coda balloon was then advanced and placed in the juxtarenal position and inflated to prevent ongoing hemorrhage.  The patient was then given 4000 units of intravenous heparin .  Working from the left-hand side pigtail catheter was used to cross the aortic bifurcation and catheter and stiff angled Glidewire were negotiated down into the distal common iliac artery aneurysm on the right.  Attempts at selecting the internal iliac artery were not successful initially and a variety of catheters were then utilized.  Ultimately a stiff angled Glidewire with a Kumpe catheter was used to access the right internal iliac artery.  Hand-injection of contrast verified positioning and a Progreat catheter was then advanced through the Kumpe catheter out into the  third order position.  Again hand-injection of contrast was utilized to demonstrate the anatomy and choose appropriate coil embolization.  Once this  information been obtained a total of 4 Ruby coils were deployed.  We began with an 8 mm x 25 cm standard coil followed by an 8 mm x 60 cm soft coil then a 45 cm packing coil and lastly an 8 mm x 35 cm soft coil.  Follow-up imaging demonstrated central the occlusion of the internal iliac artery and the catheters were removed and we moved forward with the case.  The Pigtail catheter was placed into the aorta from the right side. Using this image, we selected a 28 x 14 x 12 Main body device.  Over a stiff wire, an 16 French sheath was placed. The main body was then placed through the 16 French sheath. A pigtail catheter was placed up the right side and a magnified image at the renal arteries was performed. The main body was then deployed just below the lowest renal artery.  The main body was reconstrained 2 times to adjust positioning.  Next, a Kumpe catheter from the right was used to cannulate the contralateral gate without difficulty and successful cannulation was confirmed by twirling the pigtail catheter in the main body. We then placed a stiff wire and a retrograde arteriogram was performed through the right femoral sheath. A 12 mm x 14 mm limb was selected and deployed extending into the external iliac artery to allow for exclusion of the common iliac artery aneurysm and proper seal. The main body deployment was then completed. Based off the angiographic findings, extension limbs were necessary on the left.  A 16 x 10 iliac extender limb was then selected and deployed after angiography demonstrated the iliac bifurcation more clearly.  The limb was deployed down to but not over the iliac bifurcation.  All junction points and seals zones were treated with the compliant balloon.   The pigtail catheter was then replaced and a completion angiogram was performed.   Type I endoleak was detected on completion angiography.  We were highly suspicious this would be a problem given the juxtarenal positioning and given  the fact that this is a ruptured aneurysm situation with the patient who presents hemodynamically unstable we felt the best treatment would be to add and proximal extension cuff and then treat the left renal artery which was the lower renal artery.  By adding the cuff and excepting some element of coverage of the renal artery we felt that we would be able to get seal proximally.  We knew that this would necessitate treating the renal artery as well as part of this repair.  A 28 proximal extension cuff was then advanced up the left side from the right side via pigtail catheter magnified imaging of the juxtarenal aorta was obtained and the cuff was then deployed.  It was then treated with a Coda balloon inflation Rory was advanced up the left side.  The pigtail catheter was exchanged for a V S1 catheter and I selected the left renal artery.  Hand-injection contrast verified positioning and a supra core wire was advanced out into the distal left renal artery.  A 7 x 26 Lifestream stent was then advanced across the ostia of the renal artery and deployed without difficulty fully opening the origin of the left renal artery.  Pigtail catheter was then advanced up the left side and bolus injection of contrast was performed which demonstrated resolution of the type I  endoleak.  The renal arteries were found to be widely patent bilaterally.   At this point we elected to terminate the procedure. We secured the pro glide devices for hemostasis on the femoral arteries. The skin incision was closed with a 4-0 Monocryl. Dermabond and pressure dressing were placed. The patient was taken to the recovery room in stable condition having tolerated the procedure well.  COMPLICATIONS: none  CONDITION: stable  Cordella Shawl  07/25/2024, 4:46 PM

## 2024-07-25 NOTE — ED Triage Notes (Signed)
 Pt brought over from [facility] with complaints of abnormal CT scan. Pt tech a leakage to the thoracic aorta was seen on scan.

## 2024-07-25 NOTE — Anesthesia Postprocedure Evaluation (Signed)
 Anesthesia Post Note  Patient: Rose Perry  Procedure(s) Performed: AAA  Patient location during evaluation: SICU Anesthesia Type: General Level of consciousness: sedated Pain management: pain level controlled Vital Signs Assessment: post-procedure vital signs reviewed and stable Respiratory status: patient remains intubated per anesthesia plan Cardiovascular status: stable Postop Assessment: no apparent nausea or vomiting Anesthetic complications: no   No notable events documented.   Last Vitals:  Vitals:   07/25/24 1250 07/25/24 1255  BP: (!) 84/70 (!) 81/62  Pulse: 94 94  Resp: 17 18  Temp:    SpO2: 98% 98%    Last Pain:  Vitals:   07/25/24 1246  TempSrc:   PainSc: 10-Worst pain ever                 Fairy POUR Diondra Pines

## 2024-07-25 NOTE — Op Note (Signed)
  OPERATIVE NOTE   PROCEDURE: Insertion of temporary Trialysis catheter catheter right IJ approach. Insertion micro sheath left radial artery for arterial pressure monitoring  PRE-OPERATIVE DIAGNOSIS: Ruptured abdominal aortic aneurysm with hemodynamic instability and shock  POST-OPERATIVE DIAGNOSIS: Same  SURGEON: Cordella KANDICE Shawl M.D.  ANESTHESIA: 1% lidocaine  local infiltration  ESTIMATED BLOOD LOSS: Minimal cc  INDICATIONS:   Rose Perry is a 78 y.o. female who presents with a ruptured abdominal aortic aneurysm.  She is hemodynamically unstable and is undergoing emergent repair.  Appropriate lines are required for monitoring and administering life-saving medications and IV fluid and blood products.  DESCRIPTION: After obtaining informed consent, the patient is taken to special procedures for repair of her ruptured abdominal aortic aneurysm.  She is positioned supine.  Once anesthesia had been induced the right neck was prepped and draped in a sterile fashion. Ultrasound was placed in a sterile sleeve. Ultrasound was utilized to identify the right internal jugular vein which is noted to be echolucent and compressible indicating patency. Images recorded for the permanent record. Under real-time visualization a Seldinger needle is inserted into the vein and the guidewires advanced without difficulty. Small counterincision was made at the wire insertion site. Dilator is passed over the wire and the temporary trialysis catheter catheter is fed over the wire without difficulty.  A trialysis catheter is being utilized as this is appropriate for rapid infusion of blood products and IV fluid in association with the massive transfusion protocol.  Furthermore a central line is indicated given the current use of multiple pressors to maintain her blood pressure.  All lumens aspirate and flush easily and are packed with heparin  saline. Catheter secured to the skin of the right neck with 2-0 silk. A  sterile dressing is applied with Biopatch.  Once this had been achieved attention was then turned to the left wrist.  Left wrist was prepped and draped in sterile fashion.  Ultrasound was once again prepped and delivered into the field.  Ultrasound was utilized to demonstrate the left radial artery which was echolucent and pulsatile.  A microneedle was inserted into the radial artery and a single pass and subsequently the microwire was advanced without difficulty.  Microsheath was inserted.  A arterial pressure line was then connected and after zeroed an excellent waveform was identified.  A surgical dressing was then applied.  COMPLICATIONS: None  CONDITION: Unchanged  Cordella Shawl Office:  [phone] 07/25/2024, 2:46 PM

## 2024-07-25 NOTE — Interval H&P Note (Signed)
 History and Physical Interval Note:  07/25/2024 3:07 PM  Rose Perry  has presented today for surgery, with the diagnosis of ruptured abdominal aortic aneurysm.  The various methods of treatment have been discussed with the patient and family. After consideration of risks, benefits and other options for treatment, the patient has consented to  Procedure(s) with comments: ENDOVASCULAR STENT GRAFT (AAA) (N/A) - ruptured AAA as a surgical intervention.  The patient's history has been reviewed, patient examined, no change in status, stable for surgery.  I have reviewed the patient's chart and labs.  Questions were answered to the patient's satisfaction.     Cordella Shawl

## 2024-07-25 NOTE — Addendum Note (Signed)
 Addendum  created 07/25/24 1636 by Ledora Duncan, CRNA   Flowsheet accepted

## 2024-07-25 NOTE — Anesthesia Preprocedure Evaluation (Signed)
 Anesthesia Evaluation  Patient identified by MRN, date of birth, ID band Patient awake    Reviewed: Allergy & Precautions, NPO status , Patient's Chart, lab work & pertinent test results  History of Anesthesia Complications Negative for: history of anesthetic complications  Airway Mallampati: III  TM Distance: >3 FB Neck ROM: full    Dental  (+) Chipped, Poor Dentition, Missing   Pulmonary neg COPD, former smoker   Pulmonary exam normal        Cardiovascular hypertension, (-) angina (-) Past MI negative cardio ROS  Rhythm:regular Rate:Tachycardia     Neuro/Psych negative neurological ROS  negative psych ROS   GI/Hepatic negative GI ROS, Neg liver ROS,neg GERD  ,,  Endo/Other  negative endocrine ROSdiabetes, Type 2    Renal/GU      Musculoskeletal   Abdominal   Peds  Hematology  (+) Blood dyscrasia, anemia   Anesthesia Other Findings Rupture AAA Past Medical History: No date: Celiac artery stenosis No date: CME (cystoid macular edema)     Comment:  right No date: CNVM (choroidal neovascular membrane) No date: Diabetes mellitus without complication (HCC) No date: Diverticulosis of colon No date: Elevated lipids No date: History of cataract No date: History of rectal bleeding No date: Hx of adenomatous colonic polyps No date: Hyperlipidemia No date: Hypertension No date: Hypertensive retinopathy of both eyes No date: Lymphedema No date: Primary open angle glaucoma (POAG) of both eyes  Past Surgical History: No date: ABDOMINAL HYSTERECTOMY No date: COLONOSCOPY 08/22/2016: COLONOSCOPY WITH PROPOFOL ; N/A     Comment:  Procedure: COLONOSCOPY WITH PROPOFOL ;  Surgeon: Lamar ONEIDA Holmes, MD;  Location: [facility] ENDOSCOPY;  Service:               Endoscopy;  Laterality: N/A; 08/11/2020: COLONOSCOPY WITH PROPOFOL ; N/A     Comment:  Procedure: COLONOSCOPY WITH PROPOFOL ;  Surgeon:                Maryruth Ole ONEIDA, MD;  Location: [facility] ENDOSCOPY;                Service: Endoscopy;  Laterality: N/A; 11/12/2021: COLONOSCOPY WITH PROPOFOL ; N/A     Comment:  Procedure: COLONOSCOPY WITH PROPOFOL ;  Surgeon:               Maryruth Ole ONEIDA, MD;  Location: [facility] ENDOSCOPY;                Service: Endoscopy;  Laterality: N/A;  DM 08/18/2023: COLONOSCOPY WITH PROPOFOL ; N/A     Comment:  Procedure: COLONOSCOPY WITH PROPOFOL ;  Surgeon:               Maryruth Ole ONEIDA, MD;  Location: [facility] ENDOSCOPY;                Service: Endoscopy;  Laterality: N/A; 08/11/2020: ESOPHAGOGASTRODUODENOSCOPY (EGD) WITH PROPOFOL ; N/A     Comment:  Procedure: ESOPHAGOGASTRODUODENOSCOPY (EGD) WITH               PROPOFOL ;  Surgeon: Maryruth Ole ONEIDA, MD;  Location:               [facility] ENDOSCOPY;  Service: Endoscopy;  Laterality: N/A; No date: EYE SURGERY No date: JOINT REPLACEMENT; Left     Comment:  left total knee replacement 02/20/2024: LOWER EXTREMITY ANGIOGRAPHY; Right     Comment:  Procedure: Lower Extremity Angiography;  Surgeon:  Schnier, Cordella [name], MD;  Location: [facility] INVASIVE CV LAB;               Service: Cardiovascular;  Laterality: Right; No date: ltkr No date: needle in foot removed 08/18/2023: POLYPECTOMY     Comment:  Procedure: POLYPECTOMY;  Surgeon: Maryruth Ole DASEN,               MD;  Location: [facility] ENDOSCOPY;  Service: Endoscopy;; No date: REFRACTIVE SURGERY  BMI    Body Mass Index: 37.02 kg/m      Reproductive/Obstetrics negative OB ROS                              Anesthesia Physical Anesthesia Plan  ASA: 5 and emergent  Anesthesia Plan: General ETT, Cricoid Pressure and Rapid Sequence   Post-op Pain Management:    Induction: Intravenous  PONV Risk Score and Plan: Ondansetron , Dexamethasone, Midazolam  and Treatment may vary due to age or medical condition  Airway Management Planned: Oral ETT and Video Laryngoscope  Planned  Additional Equipment: Arterial line, CVP and Ultrasound Guidance Line Placement  Intra-op Plan:   Post-operative Plan: Post-operative intubation/ventilation  Informed Consent: I have reviewed the patients History and Physical, chart, labs and discussed the procedure including the risks, benefits and alternatives for the proposed anesthesia with the patient or authorized representative who has indicated his/her understanding and acceptance.     Dental Advisory Given  Plan Discussed with: Anesthesiologist, CRNA and Surgeon  Anesthesia Plan Comments: (Patient emergently consented in the vascular lab for risks of anesthesia including but not limited to:  - adverse reactions to medications - damage to eyes, teeth, lips or other oral mucosa - nerve damage due to positioning  - sore throat or hoarseness - Damage to heart, brain, nerves, lungs, other parts of body or loss of life  Patient voiced understanding and assent.)        Anesthesia Quick Evaluation

## 2024-07-25 NOTE — Transfer of Care (Signed)
 Immediate Anesthesia Transfer of Care Note  Patient: Rose Perry  Procedure(s) Performed: AAA  Patient Location: ICU  Anesthesia Type:General  Level of Consciousness: Patient remains intubated per anesthesia plan  Airway & Oxygen Therapy: Patient remains intubated per anesthesia plan and Patient placed on Ventilator (see vital sign flow sheet for setting)  Post-op Assessment: Report given to RN and Post -op Vital signs reviewed and stable  Post vital signs: Reviewed and stable  Last Vitals:  Vitals Value Taken Time  BP    Temp    Pulse    Resp    SpO2      Last Pain:  Vitals:   07/25/24 1246  TempSrc:   PainSc: 10-Worst pain ever         Complications: No notable events documented.Pt transferred to ICU, attached to monitors, attached to vent, full report given to ICU care team (MD, RN, RT) at bedside, norepi infusing as noted and 1unit of PRBC infusing as noted. Pt currently hemodynamically stable at time of transfer, questions answered, care assumed as noted w/o incident [facility] CRNA

## 2024-07-25 NOTE — ED Provider Notes (Signed)
 [facility] Provider Note    Event Date/Time   First MD Initiated Contact with Patient 07/25/24 1206     (approximate)   History   Abnormal Scan   HPI  Rose Perry is a 78 y.o. female abdominal pain.  Patient having 2 weeks of abdominal pain primarily in the left flank, yesterday developed the pain along the right flank as well, decided to come into the walk-in clinic for further assessment and evaluation.  While in the walk-in clinic she had CT imaging ordered it seems that there was concern of a possible ruptured aneurysm and she was sent to our emergency department for further workup and evaluation.  Currently complaining of persistent right sided flank pain.  No history of abdominal surgeries in the past, states that she tried drinking a cup of coffee this morning but threw it up right away.  No chest pain shortness of breath or other symptoms at this time.      Physical Exam   Triage Vital Signs: ED Triage Vitals  Encounter Vitals Group     BP      Girls Systolic BP Percentile      Girls Diastolic BP Percentile      Boys Systolic BP Percentile      Boys Diastolic BP Percentile      Pulse      Resp      Temp      Temp src      SpO2      Weight      Height      Head Circumference      Peak Flow      Pain Score      Pain Loc      Pain Education      Exclude from Growth Chart     Most recent vital signs: Vitals:   07/25/24 1250 07/25/24 1255  BP: (!) 84/70 (!) 81/62  Pulse: 94 94  Resp: 17 18  Temp:    SpO2: 98% 98%     General: Awake, appears uncomfortable in the stretcher CV:  Good peripheral perfusion.  Resp:  Normal effort.  Clear to auscultation bilaterally Abd:  No distention.  Minimal tenderness along the left flank, the abdomen is soft and otherwise nontender, the right side of the flank does appear to be significantly tender to palpation Other:     ED Results / Procedures / Treatments   Labs (all labs ordered are  listed, but only abnormal results are displayed) Labs Reviewed  CBC - Abnormal; Notable for the following components:      Result Value   RBC 5.25 (*)    MCH 25.7 (*)    All other components within normal limits  COMPREHENSIVE METABOLIC PANEL WITH GFR - Abnormal; Notable for the following components:   Glucose, Bld 136 (*)    All other components within normal limits  URINALYSIS, ROUTINE W REFLEX MICROSCOPIC - Abnormal; Notable for the following components:   Color, Urine STRAW (*)    APPearance CLEAR (*)    Ketones, ur 5 (*)    All other components within normal limits  DIC (DISSEMINATED INTRAVASCULAR COAGULATION)PANEL - Abnormal; Notable for the following components:   Prothrombin Time 25.6 (*)    INR 2.2 (*)    aPTT >200 (*)    Fibrinogen 97 (*)    D-Dimer, Quant 3.86 (*)    Platelets 125 (*)    All other components within normal limits  CBC - Abnormal; Notable for the following components:   RBC 2.48 (*)    Hemoglobin 6.9 (*)    HCT 21.5 (*)    Platelets 128 (*)    All other components within normal limits  BASIC METABOLIC PANEL WITH GFR - Abnormal; Notable for the following components:   CO2 18 (*)    Glucose, Bld 349 (*)    Calcium 6.4 (*)    All other components within normal limits  BLOOD GAS, ARTERIAL - Abnormal; Notable for the following components:   pH, Arterial 7.25 (*)    pO2, Arterial 227 (*)    Acid-base deficit 7.0 (*)    All other components within normal limits  GLUCOSE, CAPILLARY - Abnormal; Notable for the following components:   Glucose-Capillary 265 (*)    All other components within normal limits  TROPONIN T, HIGH SENSITIVITY - Abnormal; Notable for the following components:   Troponin T High Sensitivity 20 (*)    All other components within normal limits  MRSA NEXT GEN BY PCR, NASAL  LIPASE, BLOOD  DIC (DISSEMINATED INTRAVASCULAR COAGULATION)PANEL  DIC (DISSEMINATED INTRAVASCULAR COAGULATION)PANEL  DIC (DISSEMINATED INTRAVASCULAR  COAGULATION)PANEL  DIC (DISSEMINATED INTRAVASCULAR COAGULATION)PANEL  HEMOGLOBIN AND HEMATOCRIT, BLOOD  HEMOGLOBIN AND HEMATOCRIT, BLOOD  HEMOGLOBIN AND HEMATOCRIT, BLOOD  HEMOGLOBIN AND HEMATOCRIT, BLOOD  HEMOGLOBIN A1C  LACTIC ACID, PLASMA  LACTIC ACID, PLASMA  TYPE AND SCREEN  MASSIVE TRANSFUSION PROTOCOL ORDER (BLOOD BANK NOTIFICATION)  PREPARE FRESH FROZEN PLASMA  PREPARE PLATELET PHERESIS  PREPARE RBC (CROSSMATCH)  PREPARE CRYOPRECIPITATE  PREPARE CRYOPRECIPITATE  TROPONIN T, HIGH SENSITIVITY     EKG  Sinus rhythm with rate of about 75, axis of 10, intervals appear to be within normal limits, no obvious ischemia appreciated this EKG   RADIOLOGY I reviewed the CT scan independently, there is seem to be a large aneurysm, fortunately limited evaluation secondary to poor contrast administration, concern of possible leak awaiting final CT read from radiology.  PROCEDURES:  Critical Care performed: Yes, see critical care procedure note(s)  .Critical Care  Performed by: Fernand Rossie HERO, MD Authorized by: Fernand Rossie HERO, MD   Critical care provider statement:    Critical care time (minutes):  34   Critical care was necessary to treat or prevent imminent or life-threatening deterioration of the following conditions:  Shock and circulatory failure   Critical care was time spent personally by me on the following activities:  Ordering and performing treatments and interventions, ordering and review of laboratory studies, ordering and review of radiographic studies, pulse oximetry, re-evaluation of patient's condition, review of old charts, discussions with consultants and examination of patient   I assumed direction of critical care for this patient from another provider in my specialty: no     Care discussed with: admitting provider      MEDICATIONS ORDERED IN ED: Medications  norepinephrine (LEVOPHED) 4mg  in (0.016 mg/mL) premix infusion (5 mcg/min Intravenous New  Bag/Given 07/25/24 1621)  ondansetron  (ZOFRAN ) 4 MG/2ML injection (has no administration in time range)  potassium chloride  SA (KLOR-CON  M) CR tablet 40-60 mEq (has no administration in time range)  ceFAZolin  (ANCEF ) IVPB 2g/100 mL premix (has no administration in time range)  acetaminophen  (TYLENOL ) tablet 325-650 mg (has no administration in time range)    Or  acetaminophen  (TYLENOL ) suppository 325-650 mg (has no administration in time range)  docusate sodium (COLACE) capsule 100 mg (has no administration in time range)  phenol (CHLORASEPTIC) mouth spray 1 spray (has no administration in time  range)  insulin  aspart (novoLOG ) injection 0-15 Units (has no administration in time range)  insulin  aspart (novoLOG ) injection 0-5 Units (has no administration in time range)  0.9 %  sodium chloride  infusion (has no administration in time range)  morphine  (PF) 2 MG/ML injection 2-5 mg (has no administration in time range)  pantoprazole  (PROTONIX ) injection 40 mg (has no administration in time range)  0.9 %  sodium chloride  infusion (Manually program via Guardrails IV Fluids) (has no administration in time range)  fentaNYL  (SUBLIMAZE ) injection 25-50 mcg (has no administration in time range)  fentaNYL  in NS 250mL (30mcg/ml) infusion-PREMIX (50 mcg/hr Intravenous New Bag/Given 07/25/24 1528)  fentaNYL  (SUBLIMAZE ) bolus via infusion 25-100 mcg (has no administration in time range)  fentaNYL  10 mcg/ml infusion (has no administration in time range)  0.9 %  sodium chloride  infusion (Manually program via Guardrails IV Fluids) (has no administration in time range)  0.9 %  sodium chloride  infusion (Manually program via Guardrails IV Fluids) (has no administration in time range)  sodium chloride  flush (NS) 0.9 % injection 10-40 mL (has no administration in time range)  sodium chloride  flush (NS) 0.9 % injection 10-40 mL (has no administration in time range)  Chlorhexidine Gluconate Cloth 2 % PADS 6 each  (has no administration in time range)  morphine  (PF) 4 MG/ML injection 4 mg (4 mg Intravenous Given 07/25/24 1246)  sodium chloride  0.9 % bolus 500 mL (500 mLs Intravenous New Bag/Given 07/25/24 1245)  ondansetron  (ZOFRAN ) injection 4 mg (4 mg Intravenous Given 07/25/24 1246)  ceFAZolin  (ANCEF ) IVPB 2g/100 mL premix ( Intravenous MAR Unhold 07/25/24 1540)     IMPRESSION / MDM / ASSESSMENT AND PLAN / ED COURSE  I reviewed the triage vital signs and the nursing notes.                               Patient's presentation is most consistent with acute presentation with potential threat to life or bodily function.  78 year old female who presents today with concern of a possible abdominal aortic aneurysm rupture.  At initial evaluation complaining of pain but vitals stable.  I reviewed CT imaging, after a few minutes, patient began to quickly decompensate and became much less responsive, blood pressure decreased and heart rate started to increase, started on Levophed and immediately placed consultation for vascular surgery for evaluation and recommendations.   Clinical Course as of 07/25/24 1622  Thu Jul 25, 2024  1245 Spoke with Dr. Jama regarding patient CT findings and presentation, plan to take the patient to the operating room at this time. [SK]    Clinical Course User Index [SK] Fernand Rossie HERO, MD     FINAL CLINICAL IMPRESSION(S) / ED DIAGNOSES   Final diagnoses:  Ruptured infrarenal abdominal aortic aneurysm (AAA) (HCC)     Rx / DC Orders   ED Discharge Orders     None        Note:  This document was prepared using Dragon voice recognition software and may include unintentional dictation errors.   Fernand Rossie HERO, MD 07/25/24 [phone]

## 2024-07-25 NOTE — ED Notes (Signed)
 Vascular MD at bedside.

## 2024-07-25 NOTE — Op Note (Signed)
 OPERATIVE NOTE   PROCEDURE: US  guidance for vascular access, bilateral femoral arteries Catheter placement into aorta from bilateral femoral approaches Catheter placement into the right internal iliac artery from left femoral approach with selective imaging Catheter placement into the left renal artery from the right femoral approach with selective imaging Coil embolization of the right hypogastric artery with a total of 4 Ruby coils Placement of a 28 mm proximal conformable C3 Gore Excluder Endoprosthesis main body left with a 12 mm diameter by 14 cm length right contralateral limb down into the right external iliac artery to exclude the large aneurysm in the common iliac artery Placement of a 16 mm diameter by 10 cm length left iliac extension limb Placement of a 28 mm diameter aortic extension cuff Placement of a 7 mm diameter by 26 mm length Lifestream stent in the left renal artery ProGlide closure devices bilateral femoral arteries  PRE-OPERATIVE DIAGNOSIS: Ruptured juxtarenal AAA with hemodynamic instability  POST-OPERATIVE DIAGNOSIS: same  SURGEON: Selinda Gu, MD and Cordella Shawl, MD - Co-surgeons  ANESTHESIA: general  ESTIMATED BLOOD LOSS: 50 cc  FINDING(S): 1.  Ruptured juxtarenal AAA  SPECIMEN(S):  none  INDICATIONS:   Rose Perry is a 78 y.o. female who presents with a ruptured juxtarenal abdominal aortic aneurysm with hemodynamic instability. The anatomy was suitable for endovascular repair although concomitant coil embolization of the right internal iliac artery as well as potential left renal artery stent would be required to allow exclusion of the aneurysm and successful stent placement.  Risks and benefits of repair in an endovascular fashion were discussed and informed consent was obtained. Co-surgeons are used to expedite the procedure and reduce operative time as bilateral work needs to be done.  DESCRIPTION: After obtaining full informed written  consent, the patient was brought back to the operating room and placed supine upon the operating table.  The patient received IV antibiotics prior to induction.  After obtaining adequate anesthesia, the patient was prepped and draped in the standard fashion for endovascular AAA repair.  We then began by gaining access to both femoral arteries with US  guidance with me working on the left and Dr. Shawl working on the right.  The femoral arteries were found to be patent and accessed without difficulty with a needle under ultrasound guidance without difficulty on each side and permanent images were recorded.  We then placed 2 proglide devices on each side in a pre-close fashion and placed 8 French sheaths.  Due to her hemodynamic instability, we began the procedure with Dr. Shawl placing a 12 French sheath on the right side and then placing a Coda balloon into the super mesenteric aorta for proximal hemostasis.  The patient was then given 4000 units of intravenous heparin . The Pigtail catheter was placed into the aorta from the left side.  With the [facility] balloon up the right side, I used first a rim catheter and then a Kumpe catheter from the left side to cannulate the right internal iliac artery.  There was a large right common iliac artery aneurysm and coil embolization of this internal iliac artery would be required with extension of the stent graft down to the right external iliac artery.  Once I cannulated the origin of the internal iliac artery with the Kumpe catheter, selective imaging was performed and a Progreat microcatheter was taken out to the primary bifurcation of the internal iliac artery.  We then began coil embolization.  An 8 mm diameter by 25 cm length soft  Ruby coil was placed initially.  An 8 mm diameter by 60 cm length standard Ruby coil was then placed followed by 45 cm packing coil.  Finally, in the proximal internal iliac artery an 8 mm diameter by 35 cm length coil was placed.  Completion  imaging showed successful coil embolization of the right internal iliac artery.  We then took a picture of the aorta through pigtail catheter.  Using this image, we selected a 28 mm diameter conformable C3 Gore excluder Main body device.  Over a stiff wire, an 16 French sheath was placed up the left side. The main body was then placed through the 16 French sheath. A Kumpe catheter was placed up the right side and a magnified image at the renal arteries was performed. The main body was then deployed just below the lowest renal artery which was the left renal artery. The Kumpe catheter was used to cannulate the contralateral gate without difficulty and successful cannulation was confirmed by twirling the pigtail catheter in the main body. We then placed a stiff wire and a retrograde arteriogram was performed through the right femoral sheath.  The 12 French sheath was already in place.  A 12 mm diameter by 14 cm length right iliac extension limb would get us  down about 4 cm into the external iliac artery to exclude the common iliac artery aneurysm.  This was selected and deployed.  The main body deployment was then completed. Based off the angiographic findings, extension limbs were necessary.  A 16 mm diameter by 10 cm length left iliac extension limb was taken down to just above the left hypogastric artery. All junction points and seals zones were treated with the compliant balloon. The pigtail catheter was then replaced and a completion angiogram was performed.  A proximal type Ia endoleak was detected on completion angiography.  No type III or Ib endoleak's were seen.  The renal arteries were found to be widely patent.  The aortic stent graft was about 2 to 3 mm below the level of the left renal artery.  We then selected a 28 mm diameter aortic extension cuff to extend proximally a few millimeters.  Dr. Jama cannulated the left renal artery with a V S1 catheter where imaging showed this to be in the renal  artery.  He placed a supra core wire at the left renal artery.  I then deployed the 28 mm aortic extension cuff over about the bottom half of the left renal artery.  This was postdilated with the [facility] balloon.  Dr. Jama then deployed a 7 mm diameter by 26 mm length Lifestream stent in the left renal artery with about 1 cm of the stent hanging back into the aorta and 16 mm of the stent into the left renal artery.  This was inflated to 12 atm.  Completion imaging showed no residual endoleak proximally with excellent seal after aortic cuff extension and stenting of the left renal artery.  Both renal arteries were also patent with the left having been stented. At this point we elected to terminate the procedure. We secured the pro glide devices for hemostasis on the femoral arteries. The skin incision was closed with a 4-0 Monocryl. Dermabond and pressure dressing were placed. The patient was taken to the recovery room in stable condition having tolerated the procedure well.  COMPLICATIONS: none  CONDITION: stable  Selinda Gu  07/25/2024, 2:58 PM   This note was created with Dragon Medical transcription system. Any errors in dictation  are purely unintentional.

## 2024-07-25 NOTE — ED Notes (Signed)
 Transported to Vascular Lab with Primary RN on zoll.

## 2024-07-26 ENCOUNTER — Inpatient Hospital Stay

## 2024-07-26 DIAGNOSIS — R571 Hypovolemic shock: Secondary | ICD-10-CM | POA: Diagnosis not present

## 2024-07-26 DIAGNOSIS — I7133 Infrarenal abdominal aortic aneurysm, ruptured: Principal | ICD-10-CM

## 2024-07-26 DIAGNOSIS — E8721 Acute metabolic acidosis: Secondary | ICD-10-CM | POA: Diagnosis not present

## 2024-07-26 DIAGNOSIS — Z9889 Other specified postprocedural states: Secondary | ICD-10-CM

## 2024-07-26 DIAGNOSIS — Z95828 Presence of other vascular implants and grafts: Secondary | ICD-10-CM

## 2024-07-26 DIAGNOSIS — J9601 Acute respiratory failure with hypoxia: Secondary | ICD-10-CM | POA: Diagnosis not present

## 2024-07-26 LAB — PREPARE CRYOPRECIPITATE
Unit division: 0
Unit division: 0

## 2024-07-26 LAB — BASIC METABOLIC PANEL WITH GFR
Anion gap: 11 (ref 5–15)
BUN: 20 mg/dL (ref 8–23)
CO2: 24 mmol/L (ref 22–32)
Calcium: 8.3 mg/dL — ABNORMAL LOW (ref 8.9–10.3)
Chloride: 106 mmol/L (ref 98–111)
Creatinine, Ser: 1.53 mg/dL — ABNORMAL HIGH (ref 0.44–1.00)
GFR, Estimated: 34 mL/min — ABNORMAL LOW (ref >60)
Glucose, Bld: 131 mg/dL — ABNORMAL HIGH (ref 70–99)
Potassium: 3.4 mmol/L — ABNORMAL LOW (ref 3.5–5.1)
Sodium: 142 mmol/L (ref 135–145)

## 2024-07-26 LAB — BPAM PLATELET PHERESIS
Blood Product Expiration Date: 202511142359
Blood Product Expiration Date: 202511142359
Blood Product Expiration Date: 202511172359
ISSUE DATE / TIME: 202511131348
ISSUE DATE / TIME: 202511131523
ISSUE DATE / TIME: 202511140015
Unit Type and Rh: 202511172359
Unit Type and Rh: 5100
Unit Type and Rh: 5100
Unit Type and Rh: 9500

## 2024-07-26 LAB — PREPARE PLATELET PHERESIS
Unit division: 0
Unit division: 0
Unit division: 0

## 2024-07-26 LAB — CBC
HCT: 24.2 % — ABNORMAL LOW (ref 36.0–46.0)
Hemoglobin: 8.4 g/dL — ABNORMAL LOW (ref 12.0–15.0)
MCH: 28.4 pg (ref 26.0–34.0)
MCHC: 34.7 g/dL (ref 30.0–36.0)
MCV: 81.8 fL (ref 80.0–100.0)
Platelets: 147 K/uL — ABNORMAL LOW (ref 150–400)
RBC: 2.96 MIL/uL — ABNORMAL LOW (ref 3.87–5.11)
RDW: 16 % — ABNORMAL HIGH (ref 11.5–15.5)
WBC: 13.7 K/uL — ABNORMAL HIGH (ref 4.0–10.5)
nRBC: 0.1 % (ref 0.0–0.2)

## 2024-07-26 LAB — DIC (DISSEMINATED INTRAVASCULAR COAGULATION)PANEL
D-Dimer, Quant: 6.93 ug{FEU}/mL — AB (ref 0.00–0.50)
Fibrinogen: 346 mg/dL (ref 210–475)
INR: 1.1 (ref 0.8–1.2)
Platelets: 137 K/uL — ABNORMAL LOW (ref 150–400)
Prothrombin Time: 14.8 s (ref 11.4–15.2)
Smear Review: NONE SEEN
aPTT: 27 s (ref 24–36)

## 2024-07-26 LAB — LACTIC ACID, PLASMA: Lactic Acid, Venous: 2.1 mmol/L (ref 0.5–1.9)

## 2024-07-26 LAB — BPAM CRYOPRECIPITATE
Blood Product Expiration Date: 202511132138
Blood Product Expiration Date: 202511132228
ISSUE DATE / TIME: 202511131942
ISSUE DATE / TIME: 202511131942
Unit Type and Rh: 6200
Unit Type and Rh: 6200

## 2024-07-26 LAB — MAGNESIUM: Magnesium: 1.4 mg/dL — ABNORMAL LOW (ref 1.7–2.4)

## 2024-07-26 LAB — HEMOGLOBIN AND HEMATOCRIT, BLOOD
HCT: 24.4 % — ABNORMAL LOW (ref 36.0–46.0)
Hemoglobin: 8.4 g/dL — ABNORMAL LOW (ref 12.0–15.0)

## 2024-07-26 LAB — GLUCOSE, CAPILLARY
Glucose-Capillary: 154 mg/dL — ABNORMAL HIGH (ref 70–99)
Glucose-Capillary: 134 mg/dL — ABNORMAL HIGH (ref 70–99)

## 2024-07-26 MED ORDER — FUROSEMIDE 10 MG/ML IJ SOLN
40.0000 mg | Freq: Once | INTRAMUSCULAR | Status: AC
  Administered 2024-07-26: 40 mg via INTRAVENOUS
  Filled 2024-07-26: qty 4

## 2024-07-26 MED ORDER — POTASSIUM CHLORIDE 20 MEQ PO PACK
60.0000 meq | PACK | Freq: Once | ORAL | Status: AC
  Administered 2024-07-26: 60 meq
  Filled 2024-07-26: qty 3

## 2024-07-26 MED ORDER — FUROSEMIDE 10 MG/ML IJ SOLN
60.0000 mg | Freq: Once | INTRAMUSCULAR | Status: AC
  Administered 2024-07-26: 60 mg via INTRAVENOUS
  Filled 2024-07-26: qty 6

## 2024-07-26 MED ORDER — MAGNESIUM SULFATE 2 GM/50ML IV SOLN
2.0000 g | Freq: Once | INTRAVENOUS | Status: AC
  Administered 2024-07-26: 2 g via INTRAVENOUS
  Filled 2024-07-26: qty 50

## 2024-07-26 NOTE — Progress Notes (Signed)
 Initial Nutrition Assessment  DOCUMENTATION CODES:   Obesity unspecified  INTERVENTION:   RD will add supplements with diet advancement   If tube feeds initiated, recommend:  Vital 1.5@50ml /hr- Initiate at 66ml/hr and increase by 10ml/hr q 8 hours until goal rate reached.   ProSource TF 20- Give 60ml BID via tube, each supplement provides 80kcal and 20g of protein.   Free water flushes 30ml q4 hours to maintain tube patency   Regimen provides 1960kcal/day, 121g/day protein and 1065ml/day of free water.   Daily weights   NUTRITION DIAGNOSIS:   Inadequate oral intake related to inability to eat (pt sedated and ventilated) as evidenced by NPO status.  GOAL:   Provide needs based on ASPEN/SCCM guidelines  MONITOR:   Vent status, Labs, Weight trends, I & O's, Skin  REASON FOR ASSESSMENT:   Ventilator    ASSESSMENT:   78 y/o female with h/o AAA, DM, HTN, HLD, lymphedema and diverticulosis who is admitted with ruptured abdominal aneurysm and hemorrhagic shock s/p coil embolization 11/13.  Pt ventilated but is awake and alert. OGT in place. Pt's son is at bedside. Son reports pt with good appetite and oral intake at baseline but reports that's pt's oral intake was poor for 1 week pta r/t abdominal pain. Pt shakes her head yes when asked if she is hungry and ready to eat. RD discussed with pt and son the importance of adequate nutrition needed to preserve lean muscle. Pt is agreeable to supplements with diet advancement. Per chart, pt appears weight stable at baseline. Plan is for extubation today.   Medications reviewed and include: colace, insulin , protonix , Mg sulfate, levophed  Labs reviewed: K 3.4(L), creat 1.53(H), Mg 1.4(L) Wbc- 13.7(H), Hgb 8.4(L), Hct 24.2(L) Cbgs- 154, 127, 213, 265 x 48 hrs  AIC 6.5(H)- 11/13  Patient is currently intubated on ventilator support MV: 11.3 L/min Temp (24hrs), Avg:97.5 F (36.4 C), Min:92.7 F (33.7 C), Max:99.9 F (37.7  C)  MAP>71mmHg   UOP-   NUTRITION - FOCUSED PHYSICAL EXAM:  Flowsheet Row Most Recent Value  Orbital Region No depletion  Upper Arm Region No depletion  Thoracic and Lumbar Region No depletion  Buccal Region No depletion  Temple Region No depletion  Clavicle Bone Region No depletion  Clavicle and Acromion Bone Region No depletion  Scapular Bone Region No depletion  Dorsal Hand No depletion  Patellar Region No depletion  Anterior Thigh Region No depletion  Posterior Calf Region No depletion  Edema (RD Assessment) Mild  Hair Reviewed  Eyes Reviewed  Mouth Reviewed  Skin Reviewed  Nails Reviewed   Diet Order:   Diet Order             Diet NPO time specified  Diet effective now                  EDUCATION NEEDS:   No education needs have been identified at this time  Skin:  Skin Assessment: Reviewed RN Assessment (ecchymosis)  Last BM:  pta  Height:   Ht Readings from Last 1 Encounters:  07/25/24 5' 7 (1.702 m)    Weight:   Wt Readings from Last 1 Encounters:  07/25/24 107.2 kg    Ideal Body Weight:  61.3 kg  BMI:  Body mass index is 37.02 kg/m.  Estimated Nutritional Needs:   Kcal:  2000-2300kcal/day  Protein:  100-115g/day  Fluid:  1.7-1.9L/day  Augustin Shams MS, RD, LDN If unable to be reached, please send secure chat to RD  inpatient available from 8:00a-4:00p daily

## 2024-07-26 NOTE — Progress Notes (Signed)
 Removed L radial a-line no complications; gauze and transparent dressing in place

## 2024-07-26 NOTE — Plan of Care (Signed)
  Problem: Clinical Measurements: Goal: Cardiovascular complication will be avoided Outcome: Progressing   Problem: Coping: Goal: Level of anxiety will decrease Outcome: Progressing   Problem: Safety: Goal: Ability to remain free from injury will improve Outcome: Progressing   Problem: Skin Integrity: Goal: Risk for impaired skin integrity will decrease Outcome: Progressing   Problem: Tissue Perfusion: Goal: Adequacy of tissue perfusion will improve Outcome: Progressing

## 2024-07-26 NOTE — Progress Notes (Signed)
 Progress Note    07/26/2024 11:55 AM 1 Day Post-Op  Subjective:  Rose Perry is a 78 yo female who is now POD #1 from:  PROCEDURE: US  guidance for vascular access, bilateral femoral arteries Catheter placement into aorta from bilateral femoral approaches Catheter placement into the right internal iliac artery from the left common femoral approach third order catheter placement Coil embolization of the right internal iliac artery for treatment of the right common iliac artery aneurysm. Placement of a 28 x 14 x 12 C3 conformable Gore Excluder Endoprosthesis main body with a 16 x 10 left iliac extension limb with a 12 x 14 contralateral limb that extends into the right external iliac artery for treatment of the common iliac artery aneurysm Placement of a 28 mm proximal aortic extension cuff Selective catheterization of the left renal artery first-order catheter placement. Placement of a 7 mm x 26 mm Lifestream stent left renal artery ProGlide closure devices bilateral femoral arteries   PRE-OPERATIVE DIAGNOSIS: Ruptured juxtarenal AAA with hemodynamic shock   POST-OPERATIVE DIAGNOSIS: same   SURGEON: Cordella Shawl, MD and Selinda Gu, MD - Co-surgeons   ANESTHESIA: general   Patient is resting comfortably in ICU this morning.  She remains ventilated but not sedated.  She is able to communicate with me this morning.  She is able to communicate that she feels better this morning.  No complications overnight.  Patient remains on some blood pressure support at this time as she comes out of sedation for possible extubation this morning.   Vitals:   07/26/24 0545 07/26/24 0600  BP:    Pulse: 75 78  Resp: (!) 24 (!) 24  Temp: 99.7 F (37.6 C) 99.5 F (37.5 C)  SpO2: 100% 100%   Physical Exam: Cardiac:  RRR, normal S1 and S2.  No murmurs appreciated. Lungs: Nonlabored breathing on the ventilator.  No rales rhonchi or wheezing noted. Incisions: Bilateral groin puncture sites.   Dressings clean dry and intact and without any hematoma seroma no. Extremities: All extremities are warm to touch.  Positive palpable pulses in bilateral lower extremities. Abdomen: Positive bowel sounds throughout, patient's mid abdomen remains tender upon palpation but this is expected.  Nondistended this morning. Neurologic: Alert and oriented as she comes out of sedation but remains ventilated.  Able to communicate.  CBC    Component Value Date/Time   WBC 13.7 (H) 07/26/2024 0415   RBC 2.96 (L) 07/26/2024 0415   HGB 8.4 (L) 07/26/2024 0415   HGB 11.9 (L) 08/22/2013 0455   HCT 24.2 (L) 07/26/2024 0415   HCT 43.8 08/07/2013 0925   PLT 147 (L) 07/26/2024 0415   PLT 217 08/22/2013 0455   MCV 81.8 07/26/2024 0415   MCV 78 (L) 08/07/2013 0925   MCH 28.4 07/26/2024 0415   MCHC 34.7 07/26/2024 0415   RDW 16.0 (H) 07/26/2024 0415   RDW 14.4 08/07/2013 0925   LYMPHSABS 2.0 08/13/2020 0527   MONOABS 0.5 08/13/2020 0527   EOSABS 0.4 08/13/2020 0527   BASOSABS 0.0 08/13/2020 0527    BMET    Component Value Date/Time   NA 142 07/26/2024 0415   NA 137 08/23/2013 0519   K 3.4 (L) 07/26/2024 0415   K 3.4 (L) 08/23/2013 0519   CL 106 07/26/2024 0415   CL 103 08/23/2013 0519   CO2 24 07/26/2024 0415   CO2 30 08/23/2013 0519   GLUCOSE 131 (H) 07/26/2024 0415   GLUCOSE 168 (H) 08/23/2013 0519   BUN 20 07/26/2024 0415  BUN 12 08/23/2013 0519   CREATININE 1.53 (H) 07/26/2024 0415   CREATININE 1.17 08/23/2013 0519   CALCIUM 8.3 (L) 07/26/2024 0415   CALCIUM 8.2 (L) 08/23/2013 0519   GFRNONAA 34 (L) 07/26/2024 0415   GFRNONAA 48 (L) 08/23/2013 0519   GFRAA 55 (L) 08/23/2013 0519    INR    Component Value Date/Time   INR 1.1 07/25/2024 2343   INR 1.0 08/07/2013 0925     Intake/Output Summary (Last 24 hours) at 07/26/2024 1155 Last data filed at 07/26/2024 1100 Gross per 24 hour  Intake 10383.98 ml  Output 1525 ml  Net 8858.98 ml     Assessment/Plan:  78 y.o. female is  s/p SEE ABOVE  1 Day Post-Op   PLAN Appreciate the ICU team's management care of the patient.  I believe the plan is to extubate this morning.  Plan is to also wean any blood pressure support soon as possible. Pain medication provided as needed. Continue to monitor vital signs with bilateral lower extremity pulses. Continue bedrest for today. Recommend PT OT consults to start tomorrow on 07/27/2024.  DVT prophylaxis: None at this time due to AAA rupture.   [name] [facility] 07/26/2024 11:55 AM

## 2024-07-26 NOTE — Plan of Care (Signed)

## 2024-07-26 NOTE — Progress Notes (Signed)
 NAME:  Rose Perry, MRN:  [id number], DOB:  [birth year], LOS: 1 ADMISSION DATE:  07/25/2024, CONSULTATION DATE:  07/25/24 REFERRING MD:  Dr. Cordella Shawl, CHIEF COMPLAINT:  Abdominal pain   Brief Pt Description / Synopsis:  78 year old female with history of PAD, T2DM, and HTN who presented with abdominal pain found to have a ruptured abdominal aneurysm, admitted with hemorrhagic shock and taken emergency for AAA repair with vascular surgery. Remained intubated post-op due to severe metabolic acidosis.  History of Present Illness:  Rose Perry is a 78 y.o. female who presented to [facility] ED from [facility] [place] walk-in clinic for worsening abdominal pain x 2 days, to which a STAT Abd/Pelvis CT was ordered, resulting with concern for possible ruptured aneurysm. The patient was transported to [facility] ED for further workup. She reported the abdominal pain has been present for 2 weeks but significantly increased over the past two days. Denied shortness of breath and chest pain.  ED Course: Vitals on arrival: Temp 97.8, BP 149/65, HR 74, RR 15, SpO2 95%  Medications Administered: Cefazolin  preoperatively Morphine  NS bolus Zofran  NS gtt  Pertinent Labs/Diagnostics: Initial CBC unremarkable BMP unremarkable Troponin: 20 Lipase 17 11/13 CT abdomen/pelvis: AAA measuring 7cm, as well as a right iliac aneurysm measuring 3.5cm.   Vascular surgery emergently consulted. Upon further evaluation they decided to take her to the vascular lab for AAA repair. While waiting to go to the OR, she developed severe hypotension (80/69) and tachycardia requiring initiating of norepinephrine, as well as IVF resuscitation. She was emergently taken to the vascular lab, with worsening hypotension requiring phenylephrine , epinephrine  and vasopressin. Massive Transfusion Protocol was initiated and she was mechanically intubated. A total of 6mg  Versed  was given, 3 grams on albumin, calcium chloride 0.25g, 6units pRBC, 2  units of FFP and 1 unit of platelets. Repeat Hgb was 6.9.   PCCM was consulted for ICU admission due to hypotension requiring vasopressors and mechanical ventilation.  Please see Significant Hospital Events section below for full detailed hospital course.   Pertinent  Medical History  Hypertension Type II Diabetes Mellitus Hyperlipidemia Celiac Artery Stenosis  Significant Hospital Events: Including procedures, antibiotic start and stop dates in addition to other pertinent events   11/13: CT Abd/pelvis identified 7mm AAA concern for rupture, pt decompensated and was emergently taken to the vascular lab. Placed on levo, later requiring vaso, neo and epi during the procedure. MTP initiated, repeat Hgb was 6.9 (previous 13.5). Admitted to the ICU for mechanical ventilation management and hypotension requiring vasopressors. Started on fentanyl  gtt.  11/14: No significant events overnight.  AFebrile, on low dose levophed.  Hgb remained stable overnight, on minimal vent support, WUA & SBT as tolerated.  Diurese with 40 mg IV Lasix  x1 dose, hopeful for extubation to BiPAP.  Interim History / Subjective:  See above listed under Significant Hospital Events.  Objective    Blood pressure 116/69, pulse 78, temperature 99.5 F (37.5 C), resp. rate (!) 24, height 5' 7 (1.702 m), weight 107.2 kg, SpO2 100%.    Vent Mode: PSV;Spontaneous FiO2 (%):  [40 %-100 %] 45 % Set Rate:  [20 bmp-24 bmp] 24 bmp Vt Set:  [450 mL] 450 mL PEEP:  [5 cmH20] 5 cmH20 Pressure Support:  [5 cmH20] 5 cmH20 Plateau Pressure:  [24 cmH20-26 cmH20] 25 cmH20   Intake/Output Summary (Last 24 hours) at 07/26/2024 0918 Last data filed at 07/26/2024 9392 Gross per 24 hour  Intake 10383.98 ml  Output 1300 ml  Net 9083.98 ml   Filed Weights   07/25/24 1210  Weight: 107.2 kg    Examination: General: Acutely ill appearing female, laying in bed, intubated and awake on WUA & SBT HENT: atraumatic, normocephalic,  supple, no JVD, orally intubated Lungs: Mechanical breaths sounds bilaterally, normal effort, non-labored Cardiovascular: Tachycardia, regular rhythm, S1 S2, no m/r/g Abdomen: soft, mild distention, no ttp/rebound/guarding, bowel sounds x 4 Extremities: cool, dry, intact, radial pulses 2+, distal pulses 1+, trace edema Neuro: off sedation, following commands and nodding appropriately to questions, no focal neuro deficits noted, PERRL GU: deferred  Resolved problem list   Assessment and Plan   #Sedation in the setting of Mechanical Ventilation -Maintain a RASS goal of 0 to -1 -Fentanyl  to maintain RASS goal -Avoid sedating medications as able -Daily wake up assessment  #Hemorrhagic Shock s/t AAA Rupture s/p Stent Placement (11/13) Hx: Hypertension, HLD, Celiac Artery Stenosis -Continuous cardiac monitoring -Maintain MAP >65 -IV fluids -Transfusions as indicated -Vasopressors as needed to maintain MAP goal -Trend lactic acid until normalized -HS Troponin peaked at 20 -Diuresis as BP and renal function permits ~ discussed with Dr. Isadora, given 40 mg IV Lasix  x1 dose followed by 60 mg IV Lasix  x1 dose on 11/14 given large volume resuscitation yesterday -Vascular Surgery following, appreciate input   #Mechanical Intubation for Airway Protection Intraoperatively & Metabolic Acidosis -Full vent support, implement lung protective strategies -Plateau pressures less than 30 cm H20 -Wean FiO2 & PEEP as tolerated to maintain O2 sats >92% -Follow intermittent Chest X-ray & ABG as needed -Spontaneous Breathing Trials when respiratory parameters met and mental status permits -Implement VAP Bundle -Prn Bronchodilators  #Metabolic Acidosis s/t Hemorrhagic Shock iso AAA Rupture #Hypocalcemia -Monitor I&O's / urinary output -Follow BMP -Ensure adequate renal perfusion -Avoid nephrotoxic agents as able -Replace electrolytes as indicated ~ Pharmacy following for assistance with electrolyte  replacement  #GI Prophylaxis - Continue Protonix  40mg  - Diet: NPO - Constipation protocol  #Acute Blood Loss Anemia s/t AAA Rupture -Monitor for S/Sx of bleeding -Trend CBC -SCD's for VTE Prophylaxis  -Transfuse for Hgb <7 -Transfuse Platelets for Platelet count < 10K; < 50K with active bleeding; < 100K with Neurosurgical procedures/processes  -DIC ruled out ~ smear negative for schistocytes  -Status post MTP:  Received 7 units pRBCs, 2 units FFP and 1 unit of platelets in OR  #Type II Diabetes Mellitus - ICU hypo/hyperglycemia protocol - SSI as indicated: Novolog  - CBG Q4h     Labs   CBC: Recent Labs  Lab 07/25/24 1236 07/25/24 1419 07/25/24 2343 07/26/24 0415  WBC 9.8 9.1  --  13.7*  HGB 13.5 6.9* 8.5* 8.4*  HCT 42.9 21.5* 24.6* 24.2*  MCV 81.7 86.7  --  81.8  PLT 283 125*  128* 137* 147*    Basic Metabolic Panel: Recent Labs  Lab 07/25/24 1136 07/25/24 1236 07/25/24 1419 07/26/24 0414 07/26/24 0415  NA  --  139 142  --  142  K  --  3.6 3.6  --  3.4*  CL  --  99 111  --  106  CO2  --  27 18*  --  24  GLUCOSE  --  136* 349*  --  131*  BUN  --  16 13  --  20  CREATININE 1.00 0.90 0.71  --  1.53*  CALCIUM  --  9.7 6.4*  --  8.3*  MG  --   --   --  1.4*  --    GFR:  Estimated Creatinine Clearance: 37.6 mL/min (A) (by C-G formula based on SCr of 1.53 mg/dL (H)). Recent Labs  Lab 07/25/24 1236 07/25/24 1419 07/25/24 2343 07/26/24 0415  WBC 9.8 9.1  --  13.7*  LATICACIDVEN  --   --  2.1*  --     Liver Function Tests: Recent Labs  Lab 07/25/24 1236  AST 20  ALT 14  ALKPHOS 83  BILITOT 0.4  PROT 7.2  ALBUMIN 4.0   Recent Labs  Lab 07/25/24 1236  LIPASE 17   No results for input(s): AMMONIA in the last 168 hours.  ABG    Component Value Date/Time   PHART 7.31 (L) 07/25/2024 1807   PCO2ART 38 07/25/2024 1807   PO2ART 111 (H) 07/25/2024 1807   HCO3 19.1 (L) 07/25/2024 1807   ACIDBASEDEF 6.6 (H) 07/25/2024 1807   O2SAT 98.6  07/25/2024 1807     Coagulation Profile: Recent Labs  Lab 07/25/24 1419 07/25/24 2343  INR 2.2* 1.1    Cardiac Enzymes: No results for input(s): CKTOTAL, CKMB, CKMBINDEX, TROPONINI in the last 168 hours.  HbA1C: Hgb A1c MFr Bld  Date/Time Value Ref Range Status  07/25/2024 12:36 PM 6.5 (H) 4.8 - 5.6 % Final    Comment:    (NOTE) Diagnosis of Diabetes The following HbA1c ranges recommended by the American Diabetes Association (ADA) may be used as an aid in the diagnosis of diabetes mellitus.  Hemoglobin             Suggested A1C NGSP%              Diagnosis  <5.7                   Non Diabetic  5.7-6.4                Pre-Diabetic  >6.4                   Diabetic  <7.0                   Glycemic control for                       adults with diabetes.    08/09/2020 09:19 AM 6.5 (H) 4.8 - 5.6 % Final    Comment:    (NOTE) Pre diabetes:          5.7%-6.4%  Diabetes:              >6.4%  Glycemic control for   <7.0% adults with diabetes     CBG: Recent Labs  Lab 07/25/24 1550 07/25/24 1942 07/25/24 2347 07/26/24 0751  GLUCAP 265* 213* 127* 154*    Review of Systems:   Unable to assess as pt is intubated and sedated   Past Medical History:  She,  has a past medical history of Celiac artery stenosis, CME (cystoid macular edema), CNVM (choroidal neovascular membrane), Diabetes mellitus without complication (HCC), Diverticulosis of colon, Elevated lipids, History of cataract, History of rectal bleeding, adenomatous colonic polyps, Hyperlipidemia, Hypertension, Hypertensive retinopathy of both eyes, Lymphedema, and Primary open angle glaucoma (POAG) of both eyes.   Surgical History:   Past Surgical History:  Procedure Laterality Date   ABDOMINAL HYSTERECTOMY     COLONOSCOPY     COLONOSCOPY WITH PROPOFOL  N/A 08/22/2016   Procedure: COLONOSCOPY WITH PROPOFOL ;  Surgeon: Lamar ONEIDA Holmes, MD;  Location: [facility] ENDOSCOPY;  Service: Endoscopy;   Laterality: N/A;  COLONOSCOPY WITH PROPOFOL  N/A 08/11/2020   Procedure: COLONOSCOPY WITH PROPOFOL ;  Surgeon: Maryruth Ole DASEN, MD;  Location: [facility] ENDOSCOPY;  Service: Endoscopy;  Laterality: N/A;   COLONOSCOPY WITH PROPOFOL  N/A 11/12/2021   Procedure: COLONOSCOPY WITH PROPOFOL ;  Surgeon: Maryruth Ole DASEN, MD;  Location: [facility] ENDOSCOPY;  Service: Endoscopy;  Laterality: N/A;  DM   COLONOSCOPY WITH PROPOFOL  N/A 08/18/2023   Procedure: COLONOSCOPY WITH PROPOFOL ;  Surgeon: Maryruth Ole DASEN, MD;  Location: [facility] ENDOSCOPY;  Service: Endoscopy;  Laterality: N/A;   ENDOVASCULAR STENT GRAFT (AAA) N/A 07/25/2024   Procedure: ENDOVASCULAR STENT GRAFT (AAA);  Surgeon: Jama Cordella [name], MD;  Location[facility] INVASIVE CV LAB;  Service: Cardiovascular;  Laterality: N/A;  ruptured AAA   ENDOVASCULAR STENT GRAFT REPAIR  07/25/2024   Procedure: ENDOVASCULAR STENT GRAFT REPAIR;  Surgeon: Jama Cordella [name], MD;  Location: [facility] INVASIVE CV LAB;  Service: Cardiovascular;;   ESOPHAGOGASTRODUODENOSCOPY (EGD) WITH PROPOFOL  N/A 08/11/2020   Procedure: ESOPHAGOGASTRODUODENOSCOPY (EGD) WITH PROPOFOL ;  Surgeon: Maryruth Ole DASEN, MD;  Location: [facility] ENDOSCOPY;  Service: Endoscopy;  Laterality: N/A;   EYE SURGERY     JOINT REPLACEMENT Left    left total knee replacement   LOWER EXTREMITY ANGIOGRAPHY Right 02/20/2024   Procedure: Lower Extremity Angiography;  Surgeon: Jama Cordella [name], MD;  Location: [facility] INVASIVE CV LAB;  Service: Cardiovascular;  Laterality: Right;   ltkr     needle in foot removed     POLYPECTOMY  08/18/2023   Procedure: POLYPECTOMY;  Surgeon: Maryruth Ole DASEN, MD;  Location: [facility] ENDOSCOPY;  Service: Endoscopy;;   REFRACTIVE SURGERY       Social History:   reports that she has quit smoking. She has never used smokeless tobacco. She reports that she does not drink alcohol and does not use drugs.   Family History:  Her family history includes Coronary artery disease in her brother;  Hyperlipidemia in her mother; Hypertension in her father and mother. There is no history of Breast cancer.   Allergies Allergies  Allergen Reactions   Codeine      Home Medications  Prior to Admission medications   Medication Sig Start Date End Date Taking? Authorizing Provider  amLODipine (NORVASC) 5 MG tablet Take 1 tablet by mouth daily. 10/07/20   [provider]  aspirin  EC 81 MG tablet Take 1 tablet (81 mg total) by mouth daily. Swallow whole. 02/21/24 02/20/25  Schnier, Cordella [name], MD  atorvastatin (LIPITOR) 20 MG tablet Take 20 mg by mouth daily.    [provider]  atorvastatin (LIPITOR) 20 MG tablet Take 1 tablet by mouth daily. 05/10/21   [provider]  clopidogrel  (PLAVIX ) 75 MG tablet Take 1 tablet (75 mg total) by mouth daily. 02/21/24   Schnier, Gregory G, MD  metFORMIN (GLUCOPHAGE) 500 MG tablet Take 1 tablet by mouth 2 (two) times daily with a meal. 06/15/20   [provider]  metoprolol  tartrate (LOPRESSOR ) 25 MG tablet Take 0.5 tablets (12.5 mg total) by mouth 2 (two) times daily. Patient not taking: Reported on 06/17/2024 08/13/20   Josette Ade, MD  Multiple Vitamins-Minerals (SENTRY PO) Take by mouth daily.    [provider]  pantoprazole  (PROTONIX ) 40 MG tablet Take 1 tablet (40 mg total) by mouth daily. Patient not taking: Reported on 06/17/2024 08/13/20 01/25/25  Josette Ade, MD  polyethylene glycol (MIRALAX ) 17 g packet Take 17 g by mouth 2 (two) times daily. Patient not taking: Reported on 06/17/2024 08/14/20   Josette Ade, MD  polyethylene  glycol powder (GLYCOLAX /MIRALAX ) 17 GM/SCOOP powder Take by mouth. Patient not taking: Reported on 06/17/2024 03/25/21   [provider]  potassium chloride  (KLOR-CON ) 10 MEQ tablet Take 10 mEq by mouth daily. 05/11/20   [provider]  timolol (TIMOPTIC) 0.5 % ophthalmic solution 1 drop 2 (two) times daily.    [provider]   triamterene-hydrochlorothiazide (MAXZIDE-25) 37.5-25 MG tablet Take 1 tablet by mouth daily. 10/07/23   [provider]     Critical care time: 40 minutes     Inge Lecher, AGACNP-BC [facility] Prefer epic messenger for cross cover needs If after hours, please call E-link

## 2024-07-26 NOTE — Progress Notes (Signed)
 Extubation order written Cuff leak noted. Patient extubated and placed on BIPAP per MD order.

## 2024-07-27 ENCOUNTER — Inpatient Hospital Stay

## 2024-07-27 DIAGNOSIS — I4891 Unspecified atrial fibrillation: Secondary | ICD-10-CM

## 2024-07-27 DIAGNOSIS — I7133 Infrarenal abdominal aortic aneurysm, ruptured: Secondary | ICD-10-CM | POA: Diagnosis not present

## 2024-07-27 DIAGNOSIS — E8721 Acute metabolic acidosis: Secondary | ICD-10-CM | POA: Diagnosis not present

## 2024-07-27 DIAGNOSIS — R571 Hypovolemic shock: Secondary | ICD-10-CM | POA: Diagnosis not present

## 2024-07-27 DIAGNOSIS — J9601 Acute respiratory failure with hypoxia: Secondary | ICD-10-CM | POA: Diagnosis not present

## 2024-07-27 LAB — CBC
HCT: 22.6 % — ABNORMAL LOW (ref 36.0–46.0)
Hemoglobin: 7.7 g/dL — ABNORMAL LOW (ref 12.0–15.0)
MCH: 28.3 pg (ref 26.0–34.0)
MCHC: 34.1 g/dL (ref 30.0–36.0)
MCV: 83.1 fL (ref 80.0–100.0)
Platelets: 130 K/uL — ABNORMAL LOW (ref 150–400)
RBC: 2.72 MIL/uL — ABNORMAL LOW (ref 3.87–5.11)
RDW: 16.7 % — ABNORMAL HIGH (ref 11.5–15.5)
WBC: 12.6 K/uL — ABNORMAL HIGH (ref 4.0–10.5)
nRBC: 0.3 % — ABNORMAL HIGH (ref 0.0–0.2)

## 2024-07-27 LAB — RENAL FUNCTION PANEL
Albumin: 3.5 g/dL (ref 3.5–5.0)
Anion gap: 9 (ref 5–15)
BUN: 21 mg/dL (ref 8–23)
CO2: 26 mmol/L (ref 22–32)
Calcium: 8.4 mg/dL — ABNORMAL LOW (ref 8.9–10.3)
Chloride: 107 mmol/L (ref 98–111)
Creatinine, Ser: 1.25 mg/dL — ABNORMAL HIGH (ref 0.44–1.00)
GFR, Estimated: 44 mL/min — ABNORMAL LOW (ref >60)
Glucose, Bld: 128 mg/dL — ABNORMAL HIGH (ref 70–99)
Phosphorus: 2.5 mg/dL (ref 2.5–4.6)
Potassium: 3.4 mmol/L — ABNORMAL LOW (ref 3.5–5.1)
Sodium: 142 mmol/L (ref 135–145)

## 2024-07-27 LAB — BASIC METABOLIC PANEL WITH GFR
Anion gap: 11 (ref 5–15)
BUN: 23 mg/dL (ref 8–23)
CO2: 24 mmol/L (ref 22–32)
Calcium: 8.3 mg/dL — ABNORMAL LOW (ref 8.9–10.3)
Chloride: 107 mmol/L (ref 98–111)
Creatinine, Ser: 1.43 mg/dL — ABNORMAL HIGH (ref 0.44–1.00)
GFR, Estimated: 37 mL/min — ABNORMAL LOW (ref >60)
Glucose, Bld: 144 mg/dL — ABNORMAL HIGH (ref 70–99)
Potassium: 3.6 mmol/L (ref 3.5–5.1)
Sodium: 142 mmol/L (ref 135–145)

## 2024-07-27 LAB — MAGNESIUM
Magnesium: 2 mg/dL (ref 1.7–2.4)
Magnesium: 1.8 mg/dL (ref 1.7–2.4)

## 2024-07-27 LAB — HEMOGLOBIN: Hemoglobin: 8.3 g/dL — ABNORMAL LOW (ref 12.0–15.0)

## 2024-07-27 LAB — PHOSPHORUS: Phosphorus: 3.7 mg/dL (ref 2.5–4.6)

## 2024-07-27 MED ORDER — ONDANSETRON HCL 4 MG/2ML IJ SOLN
4.0000 mg | Freq: Four times a day (QID) | INTRAMUSCULAR | Status: DC | PRN
Start: 2024-07-27 — End: 2024-08-07
  Administered 2024-07-27: 4 mg via INTRAVENOUS

## 2024-07-27 MED ORDER — AMIODARONE HCL IN DEXTROSE 360-4.14 MG/200ML-% IV SOLN
30.0000 mg/h | INTRAVENOUS | Status: AC
  Administered 2024-07-27 – 2024-07-31 (×9): 30 mg/h via INTRAVENOUS
  Filled 2024-07-27 (×10): qty 200

## 2024-07-27 MED ORDER — AMIODARONE LOAD VIA INFUSION
150.0000 mg | Freq: Once | INTRAVENOUS | Status: AC
  Administered 2024-07-27: 150 mg via INTRAVENOUS
  Filled 2024-07-27: qty 83.34

## 2024-07-27 MED ORDER — AMIODARONE IV BOLUS ONLY 150 MG/100ML
INTRAVENOUS | Status: AC
  Filled 2024-07-27: qty 100

## 2024-07-27 MED ORDER — SODIUM CHLORIDE 0.9% FLUSH
3.0000 mL | Freq: Two times a day (BID) | INTRAVENOUS | Status: DC
  Administered 2024-07-27 – 2024-08-06 (×19): 3 mL via INTRAVENOUS

## 2024-07-27 MED ORDER — FUROSEMIDE 10 MG/ML IJ SOLN
40.0000 mg | Freq: Once | INTRAMUSCULAR | Status: AC
  Administered 2024-07-27: 40 mg via INTRAVENOUS
  Filled 2024-07-27: qty 4

## 2024-07-27 MED ORDER — POTASSIUM CHLORIDE 20 MEQ PO PACK
40.0000 meq | PACK | ORAL | Status: AC
Start: 2024-07-27 — End: 2024-07-27
  Administered 2024-07-27 (×2): 40 meq via ORAL
  Filled 2024-07-27 (×2): qty 2

## 2024-07-27 MED ORDER — ONDANSETRON HCL 4 MG/2ML IJ SOLN
INTRAMUSCULAR | Status: AC
  Filled 2024-07-27: qty 2

## 2024-07-27 MED ORDER — AMIODARONE HCL IN DEXTROSE 360-4.14 MG/200ML-% IV SOLN
60.0000 mg/h | INTRAVENOUS | Status: AC
  Administered 2024-07-27 (×2): 60 mg/h via INTRAVENOUS
  Filled 2024-07-27 (×2): qty 200

## 2024-07-27 NOTE — Plan of Care (Signed)
  Problem: Education: Goal: Knowledge of General Education information will improve Description: Including pain rating scale, medication(s)/side effects and non-pharmacologic comfort measures Outcome: Progressing   Problem: Clinical Measurements: Goal: Ability to maintain clinical measurements within normal limits will improve Outcome: Progressing Goal: Will remain free from infection Outcome: Progressing Goal: Diagnostic test results will improve Outcome: Progressing Goal: Respiratory complications will improve Outcome: Progressing Goal: Cardiovascular complication will be avoided Outcome: Progressing   Problem: Activity: Goal: Risk for activity intolerance will decrease Outcome: Progressing   Problem: Nutrition: Goal: Adequate nutrition will be maintained Outcome: Progressing   Problem: Coping: Goal: Level of anxiety will decrease Outcome: Progressing   Problem: Elimination: Goal: Will not experience complications related to bowel motility Outcome: Progressing Goal: Will not experience complications related to urinary retention Outcome: Progressing   Problem: Pain Managment: Goal: General experience of comfort will improve and/or be controlled Outcome: Progressing   Problem: Safety: Goal: Ability to remain free from injury will improve Outcome: Progressing   Problem: Skin Integrity: Goal: Risk for impaired skin integrity will decrease Outcome: Progressing   Problem: Education: Goal: Ability to describe self-care measures that may prevent or decrease complications (Diabetes Survival Skills Education) will improve Outcome: Progressing   Problem: Fluid Volume: Goal: Ability to maintain a balanced intake and output will improve Outcome: Progressing   Problem: Metabolic: Goal: Ability to maintain appropriate glucose levels will improve Outcome: Progressing   Problem: Nutritional: Goal: Maintenance of adequate nutrition will improve Outcome: Progressing Goal:  Progress toward achieving an optimal weight will improve Outcome: Progressing   Problem: Skin Integrity: Goal: Risk for impaired skin integrity will decrease Outcome: Progressing   Problem: Tissue Perfusion: Goal: Adequacy of tissue perfusion will improve Outcome: Progressing   Problem: Respiratory: Goal: Will achieve and/or maintain a regular respiratory rate, without signs or symptoms of dyspnea Outcome: Progressing   Problem: Skin Integrity: Goal: Demonstration of wound healing without infection will improve Outcome: Progressing

## 2024-07-27 NOTE — Progress Notes (Signed)
 Portable CXR done.

## 2024-07-27 NOTE — Progress Notes (Signed)
 NAME:  Rose Perry, MRN:  [id number], DOB:  1946/06/09, LOS: 2 ADMISSION DATE:  07/25/2024, CONSULTATION DATE:  07/25/24 REFERRING MD:  Dr. Cordella Shawl, CHIEF COMPLAINT:  Abdominal pain   Brief Pt Description / Synopsis:  78 year old female with history of PAD, T2DM, and HTN who presented with abdominal pain found to have a ruptured abdominal aneurysm, admitted with hemorrhagic shock and taken emergency for AAA repair with vascular surgery. Remained intubated post-op due to severe metabolic acidosis.  History of Present Illness:  [name] is a 78 y.o. female who presented to [facility] ED from [facility] walk-in clinic for worsening abdominal pain x 2 days, to which a STAT Abd/Pelvis CT was ordered, resulting with concern for possible ruptured aneurysm. The patient was transported to [facility] ED for further workup. She reported the abdominal pain has been present for 2 weeks but significantly increased over the past two days. Denied shortness of breath and chest pain.  ED Course: Vitals on arrival: Temp 97.8, BP 149/65, HR 74, RR 15, SpO2 95%  Medications Administered: Cefazolin  preoperatively Morphine  NS bolus Zofran  NS gtt  Pertinent Labs/Diagnostics: Initial CBC unremarkable BMP unremarkable Troponin: 20 Lipase 17 11/13 CT abdomen/pelvis: AAA measuring 7cm, as well as a right iliac aneurysm measuring 3.5cm.   Vascular surgery emergently consulted. Upon further evaluation they decided to take her to the vascular lab for AAA repair 11/13. While waiting to go to the OR, she developed severe hypotension (80/69) and tachycardia requiring initiating of norepinephrine, as well as IVF resuscitation. She was emergently taken to the vascular lab, with worsening hypotension requiring phenylephrine , epinephrine  and vasopressin. Massive Transfusion Protocol was initiated and she was mechanically intubated. A total of 6mg  Versed  was given, 3 grams on albumin, calcium chloride 0.25g, 6units  pRBC, 2 units of FFP and 1 unit of platelets. Repeat Hgb was 6.9.   PCCM was consulted for ICU admission due to hypotension requiring vasopressors and mechanical ventilation.  Please see Significant Hospital Events section below for full detailed hospital course.   Pertinent  Medical History  Hypertension Type II Diabetes Mellitus Hyperlipidemia Celiac Artery Stenosis  Significant Hospital Events: Including procedures, antibiotic start and stop dates in addition to other pertinent events   11/13: CT Abd/pelvis identified 7mm AAA concern for rupture, pt decompensated and was emergently taken to the vascular lab. Placed on levo, later requiring vaso, neo and epi during the procedure. MTP initiated, repeat Hgb was 6.9 (previous 13.5). Admitted to the ICU for mechanical ventilation management and hypotension requiring vasopressors. Started on fentanyl  gtt.  11/14: No significant events overnight.  AFebrile, on low dose levophed.  Hgb remained stable overnight, on minimal vent support, WUA & SBT as tolerated.  Diurese with 40 mg + 60 mg IV Lasix  dose, hopeful for extubation to BiPAP. 11/15: Weaned off Levophed overnight.  Developed tachycardic in afib, rate 140-150s, started on amiodarone gtt.  Interim History / Subjective:  See above listed under Significant Hospital Events.  Objective    Blood pressure 129/63, pulse 94, temperature 98.7 F (37.1 C), temperature source Oral, resp. rate (!) 23, height 5' 7 (1.702 m), weight 107.2 kg, SpO2 93%.    FiO2 (%):  [45 %] 45 % PEEP:  [5 cmH20] 5 cmH20 Pressure Support:  [5 cmH20] 5 cmH20   Intake/Output Summary (Last 24 hours) at 07/27/2024 0850 Last data filed at 07/27/2024 0800 Gross per 24 hour  Intake 446.97 ml  Output 1560 ml  Net -1113.03 ml  Filed Weights   07/25/24 1210  Weight: 107.2 kg    Examination: General: Acutely ill appearing female, laying in bed, awake and oriented HENT: atraumatic, normocephalic, supple, no  JVD Lungs: Clear and diminished breaths sounds bilaterally, normal effort, non-labored on 3.5L [place] Cardiovascular: Tachycardia, regular rhythm, S1 S2, no m/r/g Abdomen: soft, mild distention, slightly firm upper abdomen. no ttp/rebound/guarding, bowel sounds rare Extremities: warm to touch throughout, dry, intact, radial pulses 2+, distal pulses 1+, trace edema. Bilateteral groin dressings c/d/I without hematoma Neuro:  following commands and appropriately answering questions, no focal neuro deficits noted, PERRL GU: deferred  Resolved problem list  Mechanical Intubation for Airway Protection Intraoperatively & Metabolic Acidosis Hemorrhagic Shock iso AAA Rupture  Assessment and Plan   #Hemorrhagic Shock s/t AAA Rupture s/p Stent Placement (11/13) - resolved Hx: Hypertension, HLD, Celiac Artery Stenosis -Continuous cardiac monitoring -Maintain MAP >65 -Transfusions as indicated for hgb < 7.0 -Vasopressors as needed to maintain MAP goal per Vascular surgery -Trend lactic acid prn -HS Troponin peaked at 20 -s/p Diuresis 11/14 (total 100 mg lasix  IV)  -Vascular Surgery following, appreciate input   #Afib with RVR (11/14) - started on amiodarone infusion - transition to oral when loaded - cardiac monitoring - electrolyte replacement prn  #Hypocalcemia iso Renal Injury #AKI - sCr down-trending to baseline ~1.0 following AAA repair -Monitor I&O's / urinary output -Follow BMP -Ensure adequate renal perfusion -Avoid nephrotoxic agents as able -Replace electrolytes as indicated ~ Pharmacy following for assistance with electrolyte replacement  #GI Prophylaxis - Continue Protonix  40mg  IV - Diet: tolerating CLD, advance when ROBF (no flatus) - Constipation protocol  #Acute Blood Loss Anemia s/t AAA Rupture -Monitor for S/Sx of bleeding -Trend CBC -SCD's for VTE Prophylaxis  -Transfuse for Hgb <7 -Transfuse Platelets for Platelet count < 10K; < 50K with active bleeding; < 100K  with Neurosurgical procedures/processes  -DIC ruled out ~ smear negative for schistocytes  -Status post MTP:  Received 7 units pRBCs, 2 units FFP and 1 unit of platelets in OR  #Type II Diabetes Mellitus - ICU hypo/hyperglycemia protocol - SSI as indicated: Novolog  - CBG Q4h   Labs   CBC: Recent Labs  Lab 07/25/24 1236 07/25/24 1419 07/25/24 2343 07/26/24 0415 07/26/24 1527 07/27/24 0333  WBC 9.8 9.1  --  13.7*  --  12.6*  HGB 13.5 6.9* 8.5* 8.4* 8.4* 7.7*  HCT 42.9 21.5* 24.6* 24.2* 24.4* 22.6*  MCV 81.7 86.7  --  81.8  --  83.1  PLT 283 125*  128* 137* 147*  --  130*    Basic Metabolic Panel: Recent Labs  Lab 07/25/24 1136 07/25/24 1236 07/25/24 1419 07/26/24 0414 07/26/24 0415 07/27/24 0333  NA  --  139 142  --  142 142  K  --  3.6 3.6  --  3.4* 3.6  CL  --  99 111  --  106 107  CO2  --  27 18*  --  24 24  GLUCOSE  --  136* 349*  --  131* 144*  BUN  --  16 13  --  20 23  CREATININE 1.00 0.90 0.71  --  1.53* 1.43*  CALCIUM  --  9.7 6.4*  --  8.3* 8.3*  MG  --   --   --  1.4*  --  2.0  PHOS  --   --   --   --   --  3.7   GFR: Estimated Creatinine Clearance: 40.2 mL/min (A) (by C-G  formula based on SCr of 1.43 mg/dL (H)). Recent Labs  Lab 07/25/24 1236 07/25/24 1419 07/25/24 2343 07/26/24 0415 07/27/24 0333  WBC 9.8 9.1  --  13.7* 12.6*  LATICACIDVEN  --   --  2.1*  --   --     Liver Function Tests: Recent Labs  Lab 07/25/24 1236  AST 20  ALT 14  ALKPHOS 83  BILITOT 0.4  PROT 7.2  ALBUMIN 4.0   Recent Labs  Lab 07/25/24 1236  LIPASE 17   No results for input(s): AMMONIA in the last 168 hours.  ABG    Component Value Date/Time   PHART 7.31 (L) 07/25/2024 1807   PCO2ART 38 07/25/2024 1807   PO2ART 111 (H) 07/25/2024 1807   HCO3 19.1 (L) 07/25/2024 1807   ACIDBASEDEF 6.6 (H) 07/25/2024 1807   O2SAT 98.6 07/25/2024 1807     Coagulation Profile: Recent Labs  Lab 07/25/24 1419 07/25/24 2343  INR 2.2* 1.1    Cardiac  Enzymes: No results for input(s): CKTOTAL, CKMB, CKMBINDEX, TROPONINI in the last 168 hours.  HbA1C: Hgb A1c MFr Bld  Date/Time Value Ref Range Status  07/25/2024 12:36 PM 6.5 (H) 4.8 - 5.6 % Final    Comment:    (NOTE) Diagnosis of Diabetes The following HbA1c ranges recommended by the American Diabetes Association (ADA) may be used as an aid in the diagnosis of diabetes mellitus.  Hemoglobin             Suggested A1C NGSP%              Diagnosis  <5.7                   Non Diabetic  5.7-6.4                Pre-Diabetic  >6.4                   Diabetic  <7.0                   Glycemic control for                       adults with diabetes.    08/09/2020 09:19 AM 6.5 (H) 4.8 - 5.6 % Final    Comment:    (NOTE) Pre diabetes:          5.7%-6.4%  Diabetes:              >6.4%  Glycemic control for   <7.0% adults with diabetes     CBG: Recent Labs  Lab 07/25/24 1550 07/25/24 1942 07/25/24 2347 07/26/24 0751 07/26/24 1121  GLUCAP 265* 213* 127* 154* 134*    Review of Systems:   Unable to assess as pt is intubated and sedated   Past Medical History:  She,  has a past medical history of Celiac artery stenosis, CME (cystoid macular edema), CNVM (choroidal neovascular membrane), Diabetes mellitus without complication (HCC), Diverticulosis of colon, Elevated lipids, History of cataract, History of rectal bleeding, adenomatous colonic polyps, Hyperlipidemia, Hypertension, Hypertensive retinopathy of both eyes, Lymphedema, and Primary open angle glaucoma (POAG) of both eyes.   Surgical History:   Past Surgical History:  Procedure Laterality Date   ABDOMINAL HYSTERECTOMY     COLONOSCOPY     COLONOSCOPY WITH PROPOFOL  N/A 08/22/2016   Procedure: COLONOSCOPY WITH PROPOFOL ;  Surgeon: Lamar ONEIDA Holmes, MD;  Location: [facility] ENDOSCOPY;  Service: Endoscopy;  Laterality: N/A;  COLONOSCOPY WITH PROPOFOL  N/A 08/11/2020   Procedure: COLONOSCOPY WITH PROPOFOL ;  Surgeon:  Maryruth Ole DASEN, MD;  Location: [facility] ENDOSCOPY;  Service: Endoscopy;  Laterality: N/A;   COLONOSCOPY WITH PROPOFOL  N/A 11/12/2021   Procedure: COLONOSCOPY WITH PROPOFOL ;  Surgeon: Maryruth Ole DASEN, MD;  Location: [facility] ENDOSCOPY;  Service: Endoscopy;  Laterality: N/A;  DM   COLONOSCOPY WITH PROPOFOL  N/A 08/18/2023   Procedure: COLONOSCOPY WITH PROPOFOL ;  Surgeon: Maryruth Ole DASEN, MD;  Location: [facility] ENDOSCOPY;  Service: Endoscopy;  Laterality: N/A;   ENDOVASCULAR STENT GRAFT (AAA) N/A 07/25/2024   Procedure: ENDOVASCULAR STENT GRAFT (AAA);  Surgeon: Jama Cordella [name], MD;  Location: [facility] INVASIVE CV LAB;  Service: Cardiovascular;  Laterality: N/A;  ruptured AAA   ENDOVASCULAR STENT GRAFT REPAIR  07/25/2024   Procedure: ENDOVASCULAR STENT GRAFT REPAIR;  Surgeon: Jama Cordella [name], MD;  Location: [facility] INVASIVE CV LAB;  Service: Cardiovascular;;   ESOPHAGOGASTRODUODENOSCOPY (EGD) WITH PROPOFOL  N/A 08/11/2020   Procedure: ESOPHAGOGASTRODUODENOSCOPY (EGD) WITH PROPOFOL ;  Surgeon: Maryruth Ole DASEN, MD;  Location: [facility] ENDOSCOPY;  Service: Endoscopy;  Laterality: N/A;   EYE SURGERY     JOINT REPLACEMENT Left    left total knee replacement   LOWER EXTREMITY ANGIOGRAPHY Right 02/20/2024   Procedure: Lower Extremity Angiography;  Surgeon: Jama Cordella [name], MD;  Location: [facility] INVASIVE CV LAB;  Service: Cardiovascular;  Laterality: Right;   ltkr     needle in foot removed     POLYPECTOMY  08/18/2023   Procedure: POLYPECTOMY;  Surgeon: Maryruth Ole DASEN, MD;  Location: [facility] ENDOSCOPY;  Service: Endoscopy;;   REFRACTIVE SURGERY       Social History:   reports that she has quit smoking. She has never used smokeless tobacco. She reports that she does not drink alcohol and does not use drugs.   Family History:  Her family history includes Coronary artery disease in her brother; Hyperlipidemia in her mother; Hypertension in her father and mother. There is no history of Breast cancer.    Allergies Allergies  Allergen Reactions   Codeine      Home Medications  Prior to Admission medications   Medication Sig Start Date End Date Taking? Authorizing Provider  amLODipine (NORVASC) 5 MG tablet Take 1 tablet by mouth daily. 10/07/20   [provider]  aspirin  EC 81 MG tablet Take 1 tablet (81 mg total) by mouth daily. Swallow whole. 02/21/24 02/20/25  Schnier, Cordella [name], MD  atorvastatin (LIPITOR) 20 MG tablet Take 20 mg by mouth daily.    [provider]  atorvastatin (LIPITOR) 20 MG tablet Take 1 tablet by mouth daily. 05/10/21   [provider]  clopidogrel  (PLAVIX ) 75 MG tablet Take 1 tablet (75 mg total) by mouth daily. 02/21/24   Schnier, Gregory G, MD  metFORMIN (GLUCOPHAGE) 500 MG tablet Take 1 tablet by mouth 2 (two) times daily with a meal. 06/15/20   [provider]  metoprolol  tartrate (LOPRESSOR ) 25 MG tablet Take 0.5 tablets (12.5 mg total) by mouth 2 (two) times daily. Patient not taking: Reported on 06/17/2024 08/13/20   Josette Ade, MD  Multiple Vitamins-Minerals (SENTRY PO) Take by mouth daily.    [provider]  pantoprazole  (PROTONIX ) 40 MG tablet Take 1 tablet (40 mg total) by mouth daily. Patient not taking: Reported on 06/17/2024 08/13/20 01/25/25  Josette Ade, MD  polyethylene glycol (MIRALAX ) 17 g packet Take 17 g by mouth 2 (two) times daily. Patient not taking: Reported on 06/17/2024 08/14/20   Josette Ade, MD  polyethylene glycol powder (GLYCOLAX /MIRALAX ) 17 GM/SCOOP powder Take by mouth. Patient not taking: Reported on 06/17/2024 03/25/21   [provider]  potassium chloride  (KLOR-CON ) 10 MEQ tablet Take 10 mEq by mouth daily. 05/11/20   [provider]  timolol (TIMOPTIC) 0.5 % ophthalmic solution 1 drop 2 (two) times daily.    [provider]  triamterene-hydrochlorothiazide (MAXZIDE-25) 37.5-25 MG tablet Take 1 tablet by mouth daily. 10/07/23   [provider]      Critical care time: 40 minutes    Osby Sweetin, ACNP-BC Pulmonary Critical Care, [place] Phone: [phone]

## 2024-07-27 NOTE — Progress Notes (Signed)
 2 Days Post-Op   Subjective/Chief Complaint: AFIB with RVR overnight. On Amiodarone gtt. HgB 7.7. Otherwise stable. Tolerating clear diet   Objective: Vital signs in last 24 hours: Temp:  [98.1 F (36.7 C)-99.5 F (37.5 C)] 98.7 F (37.1 C) (11/14 1930) Pulse Rate:  [64-174] 89 (11/15 0900) Resp:  [16-25] 19 (11/15 0900) BP: (107-153)/(52-72) 107/53 (11/15 0900) SpO2:  [90 %-98 %] 91 % (11/15 0900) Arterial Line BP: (98-123)/(57-86) 98/57 (11/14 1200) FiO2 (%):  [45 %] 45 % (11/14 1300) Last BM Date :  (PTA)  Intake/Output from previous day: 11/14 0701 - 11/15 0700 In: 451.3 [I.V.:401.2; IV Piggyback:50.1] Out: 1310 [Urine:1310] Intake/Output this shift: Total I/O In: 51.9 [I.V.:51.9] Out: 325 [Urine:325]  General appearance: alert Resp: diminished breath sounds bilaterally Cardio: irregularly irregular rhythm GI: soft, non-tender; bowel sounds normal; no masses,  no organomegaly Extremities: bilateral groin access sites- soft, no hematoma, legs warm,palpable DP  Lab Results:  Recent Labs    07/26/24 0415 07/26/24 1527 07/27/24 0333  WBC 13.7*  --  12.6*  HGB 8.4* 8.4* 7.7*  HCT 24.2* 24.4* 22.6*  PLT 147*  --  130*   BMET Recent Labs    07/26/24 0415 07/27/24 0333  NA 142 142  K 3.4* 3.6  CL 106 107  CO2 24 24  GLUCOSE 131* 144*  BUN 20 23  CREATININE 1.53* 1.43*  CALCIUM 8.3* 8.3*   PT/INR Recent Labs    07/25/24 1419 07/25/24 2343  LABPROT 25.6* 14.8  INR 2.2* 1.1   ABG Recent Labs    07/25/24 1541 07/25/24 1807  PHART 7.25* 7.31*  HCO3 20.2 19.1*    Studies/Results: DG Chest Port 1 View Result Date: 07/26/2024 CLINICAL DATA:  Respiratory with hypoxia. EXAM: PORTABLE CHEST 1 VIEW COMPARISON:  Radiographs 07/25/2024.  Abdominal CT 07/25/2024. FINDINGS: 0813 hours. Tip of the endotracheal tube is 5.7 cm above the carina. Enteric tube has been advanced with the side hole below the gastroesophageal junction. Right IJ central venous  catheter projects to the inferior right atrium. Persistent low lung volumes with patchy opacities at both lung bases, likely atelectasis. Stable mild blunting of the left costophrenic angle without significant pleural effusion on recent CT. Stable cardiomegaly and mediastinal contours. IMPRESSION: 1. Interval advancement of enteric tube with side hole below the gastroesophageal junction. 2. No other significant changes. Persistent low lung volumes with bibasilar atelectasis. Electronically Signed   By: Elsie Perone M.D.   On: 07/26/2024 12:05   PERIPHERAL VASCULAR CATHETERIZATION Result Date: 07/25/2024 See surgical note for result.  DG Abd 1 View Result Date: 07/25/2024 CLINICAL DATA:  OG placement EXAM: ABDOMEN - 1 VIEW COMPARISON:  CT 07/25/2024 FINDINGS: Right-sided central venous catheter tip at the low right atrium. Enteric tube tip folded back upon itself in the region of the stomach. Nonobstructed gas pattern. Aortic stent graft is noted. IMPRESSION: Enteric tube tip folded back upon itself in the region of the stomach. Electronically Signed   By: Luke Bun M.D.   On: 07/25/2024 16:30   DG Chest Port 1 View Result Date: 07/25/2024 CLINICAL DATA:  Intubated central line EXAM: PORTABLE CHEST 1 VIEW COMPARISON:  None Available. FINDINGS: Endotracheal tube tip is about 4.5 cm superior to the carina. Enteric tube tip at the level of distal esophagus, side-port in the region of mid esophagus. Right IJ central venous catheter tip at the low right atrium. Hypoventilatory changes. Mild cardiomegaly. Possible small left effusion. Suspect patchy atelectasis at the left base. IMPRESSION: 1.  Endotracheal tube tip about 4.5 cm superior to the carina. 2. Enteric tube tip at the level of distal esophagus, side-port in the region of mid esophagus. Recommend advancement. See separately dictated abdominal radiograph 3. Right IJ central venous catheter tip at the low right atrium. 4. Hypoventilatory changes  with possible small left effusion and patchy atelectasis at the left base. Electronically Signed   By: Luke Bun M.D.   On: 07/25/2024 16:29   CT ABDOMEN PELVIS W CONTRAST Result Date: 07/25/2024 CLINICAL DATA:  Acute right flank pain with associated vomiting. History of abdominal aortic aneurysm measuring approximately 4.5 x 4.9 cm in dimensions by CTA in 2021. EXAM: CT ABDOMEN AND PELVIS WITH CONTRAST TECHNIQUE: Multidetector CT imaging of the abdomen and pelvis was performed using the standard protocol following bolus administration of intravenous contrast. RADIATION DOSE REDUCTION: This exam was performed according to the departmental dose-optimization program which includes automated exposure control, adjustment of the mA and/or kV according to patient size and/or use of iterative reconstruction technique. CONTRAST:  OMNIPAQUE  IOHEXOL  300 MG/ML  SOLN COMPARISON:  CTA abdomen pelvis 08/10/2020 FINDINGS: Lower chest: No acute abnormality. Hepatobiliary: No focal liver abnormality is seen. No gallstones, gallbladder wall thickening, or biliary dilatation. Pancreas: Unremarkable. No pancreatic ductal dilatation or surrounding inflammatory changes. Spleen: Normal in size without focal abnormality. Adrenals/Urinary Tract: Adrenal glands are unremarkable. Kidneys are normal, without renal calculi, focal lesion, or hydronephrosis. Bladder is unremarkable. Stomach/Bowel: Bowel shows no evidence of obstruction, ileus, inflammation or lesion. The appendix is normal. No free intraperitoneal air. Vascular/Lymphatic: No lymphadenopathy. Significant interval enlargement infrarenal abdominal aortic aneurysm now measuring approximately 5.7 x 6.9 cm in maximum transverse dimensions with maximum oblique diameter of 7.0 cm. There is evidence to suggest acute focal leak from the right anterior aspect of the aneurysm with retroperitoneal stranding anterior to the aneurysm and to the right of the aneurysm also abutting  the posterior duodenum and anterior aspect of the IVC. No dissection. Mural thrombus present in the aneurysm sac. Interval enlargement proximal right common iliac artery aneurysm now measuring up to 3.5 cm. Reproductive: Status post hysterectomy. No adnexal masses. Other: Umbilical hernia containing fat. Musculoskeletal: No acute or significant osseous findings. IMPRESSION: 1. Interval enlargement of infrarenal abdominal aortic aneurysm now measuring up to 7 cm in greatest diameter. Evidence of acute focal retroperitoneal stranding anterior and to the right of the aneurysm consistent with focal hemorrhage/leak from the aneurysm. 2. Interval enlargement of right common iliac artery aneurysm now measuring up to 3.5 cm. 3. These results were called to the ordering clinician emergently by the CT Department and the patient will be transported to the Emergency Department. Electronically Signed   By: Marcey Moan M.D.   On: 07/25/2024 12:06    Anti-infectives: Anti-infectives (From admission, onward)    Start     Dose/Rate Route Frequency Ordered Stop   07/26/24 0600  ceFAZolin  (ANCEF ) IVPB 2g/100 mL premix        2 g 200 mL/hr over 30 Minutes Intravenous On call to O.R. 07/25/24 1257 07/26/24 0804   07/25/24 2100  ceFAZolin  (ANCEF ) IVPB 2g/100 mL premix        2 g 200 mL/hr over 30 Minutes Intravenous Every 8 hours 07/25/24 1530 07/26/24 0700       Assessment/Plan: s/p Procedure(s) with comments: ENDOVASCULAR STENT GRAFT (AAA) (N/A) - ruptured AAA ENDOVASCULAR STENT GRAFT REPAIR POD #2  Appreciate Intensivist input and care  AFIB- Rate control Monitor HgB; Cr Gentle Diuresis Supportive care  LOS: 2 days    Tisa Curry LABOR 07/27/2024

## 2024-07-27 NOTE — Progress Notes (Signed)
 Pt experienced episode of afib at rate around 150, lasting approximately 8 minutes. Pt asymptomtatic.

## 2024-07-27 NOTE — Progress Notes (Signed)
 IV lasix  40 mg given as ordered. Incentive spirometer given to patient; she was instructed and able to demonstrate use. Small amount clear thick sputum noted with productive cough.

## 2024-07-28 ENCOUNTER — Inpatient Hospital Stay

## 2024-07-28 DIAGNOSIS — N179 Acute kidney failure, unspecified: Secondary | ICD-10-CM

## 2024-07-28 DIAGNOSIS — J9601 Acute respiratory failure with hypoxia: Secondary | ICD-10-CM | POA: Diagnosis not present

## 2024-07-28 DIAGNOSIS — J90 Pleural effusion, not elsewhere classified: Secondary | ICD-10-CM

## 2024-07-28 DIAGNOSIS — I713 Abdominal aortic aneurysm, ruptured, unspecified: Secondary | ICD-10-CM | POA: Diagnosis not present

## 2024-07-28 DIAGNOSIS — E119 Type 2 diabetes mellitus without complications: Secondary | ICD-10-CM

## 2024-07-28 DIAGNOSIS — R918 Other nonspecific abnormal finding of lung field: Secondary | ICD-10-CM | POA: Diagnosis not present

## 2024-07-28 DIAGNOSIS — J189 Pneumonia, unspecified organism: Secondary | ICD-10-CM

## 2024-07-28 DIAGNOSIS — R079 Chest pain, unspecified: Secondary | ICD-10-CM

## 2024-07-28 DIAGNOSIS — Z87891 Personal history of nicotine dependence: Secondary | ICD-10-CM | POA: Diagnosis not present

## 2024-07-28 DIAGNOSIS — D62 Acute posthemorrhagic anemia: Secondary | ICD-10-CM

## 2024-07-28 DIAGNOSIS — G9340 Encephalopathy, unspecified: Secondary | ICD-10-CM

## 2024-07-28 LAB — BASIC METABOLIC PANEL WITH GFR
Anion gap: 10 (ref 5–15)
Anion gap: 11 (ref 5–15)
BUN: 21 mg/dL (ref 8–23)
BUN: 22 mg/dL (ref 8–23)
CO2: 26 mmol/L (ref 22–32)
CO2: 25 mmol/L (ref 22–32)
Calcium: 8.3 mg/dL — ABNORMAL LOW (ref 8.9–10.3)
Calcium: 8.6 mg/dL — ABNORMAL LOW (ref 8.9–10.3)
Chloride: 107 mmol/L (ref 98–111)
Chloride: 106 mmol/L (ref 98–111)
Creatinine, Ser: 1.21 mg/dL — ABNORMAL HIGH (ref 0.44–1.00)
Creatinine, Ser: 1.18 mg/dL — ABNORMAL HIGH (ref 0.44–1.00)
GFR, Estimated: 45 mL/min — ABNORMAL LOW (ref >60)
GFR, Estimated: 47 mL/min — ABNORMAL LOW (ref >60)
Glucose, Bld: 128 mg/dL — ABNORMAL HIGH (ref 70–99)
Glucose, Bld: 150 mg/dL — ABNORMAL HIGH (ref 70–99)
Potassium: 4 mmol/L (ref 3.5–5.1)
Potassium: 3.9 mmol/L (ref 3.5–5.1)
Sodium: 142 mmol/L (ref 135–145)
Sodium: 142 mmol/L (ref 135–145)

## 2024-07-28 LAB — MAGNESIUM
Magnesium: 1.9 mg/dL (ref 1.7–2.4)
Magnesium: 1.9 mg/dL (ref 1.7–2.4)

## 2024-07-28 LAB — BLOOD GAS, ARTERIAL
Acid-Base Excess: 5.1 mmol/L — ABNORMAL HIGH (ref 0.0–2.0)
Bicarbonate: 30.4 mmol/L — ABNORMAL HIGH (ref 20.0–28.0)
FIO2: 100 %
O2 Content: 15 L/min
O2 Saturation: 89 %
Patient temperature: 37
pCO2 arterial: 48 mmHg (ref 32–48)
pH, Arterial: 7.41 (ref 7.35–7.45)
pO2, Arterial: 52 mmHg — ABNORMAL LOW (ref 83–108)

## 2024-07-28 LAB — CBC
HCT: 24.2 % — ABNORMAL LOW (ref 36.0–46.0)
HCT: 25.1 % — ABNORMAL LOW (ref 36.0–46.0)
Hemoglobin: 7.7 g/dL — ABNORMAL LOW (ref 12.0–15.0)
Hemoglobin: 8.1 g/dL — ABNORMAL LOW (ref 12.0–15.0)
MCH: 27.6 pg (ref 26.0–34.0)
MCH: 28.1 pg (ref 26.0–34.0)
MCHC: 31.8 g/dL (ref 30.0–36.0)
MCHC: 32.3 g/dL (ref 30.0–36.0)
MCV: 86.7 fL (ref 80.0–100.0)
MCV: 87.2 fL (ref 80.0–100.0)
Platelets: 136 K/uL — ABNORMAL LOW (ref 150–400)
Platelets: 133 K/uL — ABNORMAL LOW (ref 150–400)
RBC: 2.79 MIL/uL — ABNORMAL LOW (ref 3.87–5.11)
RBC: 2.88 MIL/uL — ABNORMAL LOW (ref 3.87–5.11)
RDW: 16.8 % — ABNORMAL HIGH (ref 11.5–15.5)
RDW: 16.9 % — ABNORMAL HIGH (ref 11.5–15.5)
WBC: 10.5 K/uL (ref 4.0–10.5)
WBC: 10 K/uL (ref 4.0–10.5)
nRBC: 0.6 % — ABNORMAL HIGH (ref 0.0–0.2)
nRBC: 1.1 % — ABNORMAL HIGH (ref 0.0–0.2)

## 2024-07-28 LAB — TROPONIN T, HIGH SENSITIVITY
Troponin T High Sensitivity: 76 ng/L — ABNORMAL HIGH (ref 0–19)
Troponin T High Sensitivity: 323 ng/L (ref 0–19)
Troponin T High Sensitivity: 770 ng/L (ref 0–19)

## 2024-07-28 LAB — PHOSPHORUS: Phosphorus: 2.5 mg/dL (ref 2.5–4.6)

## 2024-07-28 LAB — LACTIC ACID, PLASMA: Lactic Acid, Venous: 1 mmol/L (ref 0.5–1.9)

## 2024-07-28 MED ORDER — VANCOMYCIN HCL IN DEXTROSE 1-5 GM/200ML-% IV SOLN: 1000.0000 mg | INTRAVENOUS | Status: DC

## 2024-07-28 MED ORDER — FUROSEMIDE 10 MG/ML IJ SOLN
INTRAMUSCULAR | Status: AC
  Filled 2024-07-28: qty 2

## 2024-07-28 MED ORDER — SENNOSIDES 8.8 MG/5ML PO SYRP
15.0000 mL | ORAL_SOLUTION | Freq: Every day | ORAL | Status: DC
  Administered 2024-07-30 – 2024-08-05 (×6): 15 mL via ORAL
  Filled 2024-07-28 (×9): qty 15

## 2024-07-28 MED ORDER — PIPERACILLIN-TAZOBACTAM 3.375 G IVPB
3.3750 g | Freq: Three times a day (TID) | INTRAVENOUS | Status: DC
  Administered 2024-07-28 – 2024-08-04 (×19): 3.375 g via INTRAVENOUS
  Filled 2024-07-28 (×18): qty 50

## 2024-07-28 MED ORDER — FUROSEMIDE 10 MG/ML IJ SOLN
40.0000 mg | Freq: Two times a day (BID) | INTRAMUSCULAR | Status: DC
Start: 2024-07-28 — End: 2024-07-28

## 2024-07-28 MED ORDER — FUROSEMIDE 10 MG/ML IJ SOLN
60.0000 mg | Freq: Once | INTRAMUSCULAR | Status: AC
  Administered 2024-07-28: 60 mg via INTRAVENOUS

## 2024-07-28 MED ORDER — FUROSEMIDE 10 MG/ML IJ SOLN
INTRAMUSCULAR | Status: AC
  Filled 2024-07-28: qty 4

## 2024-07-28 MED ORDER — LEVALBUTEROL HCL 1.25 MG/0.5ML IN NEBU
1.2500 mg | INHALATION_SOLUTION | Freq: Four times a day (QID) | RESPIRATORY_TRACT | Status: DC
  Administered 2024-07-28 – 2024-07-31 (×10): 1.25 mg via RESPIRATORY_TRACT
  Filled 2024-07-28 (×16): qty 0.5

## 2024-07-28 MED ORDER — ORAL CARE MOUTH RINSE: 15.0000 mL | OROMUCOSAL | Status: DC | PRN

## 2024-07-28 MED ORDER — FUROSEMIDE 10 MG/ML IJ SOLN: 60.0000 mg | Freq: Once | INTRAMUSCULAR | Status: DC

## 2024-07-28 MED ORDER — MAGNESIUM SULFATE IN D5W 1-5 GM/100ML-% IV SOLN
1.0000 g | Freq: Once | INTRAVENOUS | Status: AC
  Administered 2024-07-28: 1 g via INTRAVENOUS
  Filled 2024-07-28: qty 100

## 2024-07-28 MED ORDER — SODIUM CHLORIDE 0.9 % IV SOLN: INTRAVENOUS | Status: AC | PRN

## 2024-07-28 MED ORDER — LEVALBUTEROL HCL 1.25 MG/0.5ML IN NEBU
1.2500 mg | INHALATION_SOLUTION | Freq: Once | RESPIRATORY_TRACT | Status: AC
  Administered 2024-07-28: 1.25 mg via RESPIRATORY_TRACT
  Filled 2024-07-28: qty 0.5

## 2024-07-28 MED ORDER — IPRATROPIUM BROMIDE 0.02 % IN SOLN
0.5000 mg | Freq: Once | RESPIRATORY_TRACT | Status: AC
  Administered 2024-07-28: 0.5 mg via RESPIRATORY_TRACT
  Filled 2024-07-28: qty 2.5

## 2024-07-28 MED ORDER — VANCOMYCIN HCL 2000 MG/400ML IV SOLN
2000.0000 mg | Freq: Once | INTRAVENOUS | Status: AC
  Administered 2024-07-28: 2000 mg via INTRAVENOUS
  Filled 2024-07-28: qty 400

## 2024-07-28 MED ORDER — IPRATROPIUM BROMIDE 0.02 % IN SOLN
0.5000 mg | Freq: Four times a day (QID) | RESPIRATORY_TRACT | Status: DC
  Administered 2024-07-28 – 2024-07-31 (×10): 0.5 mg via RESPIRATORY_TRACT
  Filled 2024-07-28 (×10): qty 2.5

## 2024-07-28 MED ORDER — FUROSEMIDE 10 MG/ML IJ SOLN
20.0000 mg | Freq: Every day | INTRAMUSCULAR | Status: DC
  Administered 2024-07-29: 20 mg via INTRAVENOUS
  Filled 2024-07-28: qty 2

## 2024-07-28 NOTE — Evaluation (Signed)
 Physical Therapy Evaluation Patient Details Name: Rose Perry MRN: [id number] DOB: 1946-02-25 Today's Date: 07/28/2024  History of Present Illness  Pt is a 78 y/o F admitted on 07/25/24 after presenting with c/o worsening abdominal pain. Imaging was concerned for possible ruptured aneurysm. Pt emergently taken to the vascular lab for repair. Pt also being treated for hemorrhagic shock s/t AAA rupture s/p stent placement, a-fib with RVR. PMH: HTN, DM2, HLD, celiac artery stenosis  Clinical Impression  Pt seen for PT evaluation with pt agreeable to tx, son present & sister arriving. Pt reports prior to admission she was living with son, family nearby & visits often each day. Pt reports she was ambulatory with SPC, driving, denies falls. On this date, pt is able to complete bed mobility with supervision, sit>stand with min assist & transfer bed>recliner with HHA min assist. Gait deferred 2/2 O2 needs, MD in room to assess during PT session, aware of SpO2 needs. Recommend ongoing PT services to progress mobility as able.  Pt received on 3L/min, SpO2 as low as 70% after transfer to recliner, increased to 5L/min. Pt required ~5 minutes of rest & pursed lip breathing but SpO2 eventually increased to 90%. While sitting in chair with MD present & speaking with pt/family, SpO2 dropped again to 75%. Pt endorses slight SOB. Pt able to increase SpO2 to mid 80s, MD/nurse in room & aware.      If plan is discharge home, recommend the following: A little help with walking and/or transfers;A little help with bathing/dressing/bathroom;Assistance with cooking/housework;Assist for transportation;Help with stairs or ramp for entrance   Can travel by private vehicle        Equipment Recommendations BSC/3in1  Recommendations for Other Services       Functional Status Assessment Patient has had a recent decline in their functional status and demonstrates the ability to make significant improvements in function  in a reasonable and predictable amount of time.     Precautions / Restrictions Precautions Precautions: Fall Precaution/Restrictions Comments: monitor O2, HR Restrictions Weight Bearing Restrictions Per Provider Order: No      Mobility  Bed Mobility Overal bed mobility: Needs Assistance Bed Mobility: Supine to Sit     Supine to sit: Supervision, HOB elevated, Used rails (exit R side of bed)          Transfers Overall transfer level: Needs assistance Equipment used: 1 person hand held assist Transfers: Sit to/from Stand, Bed to chair/wheelchair/BSC Sit to Stand: Min assist   Step pivot transfers: Min assist       General transfer comment: sit>stand from EOB with HHA, min assist & cuing re: hand placement to push to standing. Pt transfers bed>recliner on R with RW & BUE HHA min assist via step pivot.    Ambulation/Gait                  Stairs            Wheelchair Mobility     Tilt Bed    Modified Rankin (Stroke Patients Only)       Balance Overall balance assessment: Needs assistance Sitting-balance support: Feet supported Sitting balance-Leahy Scale: Good     Standing balance support: Bilateral upper extremity supported, During functional activity Standing balance-Leahy Scale: Fair                               Pertinent Vitals/Pain Pain Assessment Pain Assessment: Faces Faces Pain Scale: Hurts  a little bit Pain Location: generalized Pain Descriptors / Indicators: Discomfort Pain Intervention(s): Monitored during session    Home Living Family/patient expects to be discharged to:: Private residence Living Arrangements: Children Available Help at Discharge: Family;Available 24 hours/day (son works outside of the home but pt's entire family lives on the same street (sister lives beside of her) & between all of them they can provide 24 hr assist at d/c) Type of Home: House Home Access: Stairs to enter Entrance  Stairs-Rails: None Entrance Stairs-Number of Steps: 2-3   Home Layout: One level Home Equipment: Cane - single Librarian, Academic (2 wheels)      Prior Function               Mobility Comments: Ambulatory with SPC, driving, denies falls. ADLs Comments: Independent     Extremity/Trunk Assessment   Upper Extremity Assessment Upper Extremity Assessment: Generalized weakness    Lower Extremity Assessment Lower Extremity Assessment: Generalized weakness       Communication   Communication Communication: No apparent difficulties    Cognition Arousal: Alert Behavior During Therapy: WFL for tasks assessed/performed (does report feeling a little nervous/anxious)   PT - Cognitive impairments: No apparent impairments                         Following commands: Intact       Cueing Cueing Techniques: Verbal cues     General Comments General comments (skin integrity, edema, etc.): Educated pt on pursed lip breathing with fair return demo.    Exercises     Assessment/Plan    PT Assessment Patient needs continued PT services  PT Problem List Decreased strength;Cardiopulmonary status limiting activity;Decreased activity tolerance;Decreased balance;Decreased mobility;Decreased safety awareness;Decreased knowledge of use of DME       PT Treatment Interventions DME instruction;Balance training;Gait training;Neuromuscular re-education;Stair training;Functional mobility training;Patient/family education;Therapeutic activities;Therapeutic exercise    PT Goals (Current goals can be found in the Care Plan section)  Acute Rehab PT Goals Patient Stated Goal: get better PT Goal Formulation: With patient/family Time For Goal Achievement: 08/11/24 Potential to Achieve Goals: Good    Frequency Min 2X/week     Co-evaluation               AM-PAC PT 6 Clicks Mobility  Outcome Measure Help needed turning from your back to your side while in a flat bed  without using bedrails?: None Help needed moving from lying on your back to sitting on the side of a flat bed without using bedrails?: A Little Help needed moving to and from a bed to a chair (including a wheelchair)?: A Little Help needed standing up from a chair using your arms (e.g., wheelchair or bedside chair)?: A Little Help needed to walk in hospital room?: A Little Help needed climbing 3-5 steps with a railing? : A Lot 6 Click Score: 18    End of Session Equipment Utilized During Treatment: Oxygen Activity Tolerance:  (limited 2/2 O2 needs) Patient left: in chair;with call bell/phone within reach;with family/visitor present (MD in room) Nurse Communication: Mobility status (MD & nurse made aware of O2 during session) PT Visit Diagnosis: Difficulty in walking, not elsewhere classified (R26.2);Muscle weakness (generalized) (M62.81);Unsteadiness on feet (R26.81)    Time: 8684-8660 PT Time Calculation (min) (ACUTE ONLY): 24 min   Charges:   PT Evaluation $PT Eval Moderate Complexity: 1 Mod   PT General Charges $$ ACUTE PT VISIT: 1 Visit  Richerd Pinal, PT, DPT 07/28/24, 2:47 PM   Richerd CHRISTELLA Pinal 07/28/2024, 2:43 PM

## 2024-07-28 NOTE — Progress Notes (Signed)
 3 Days Post-Op   Subjective/Chief Complaint: Brief AFIB overnight. Remains on Amiodarone gtt. Tolerating PO. Denies nausea. No flatus. Denies abdominal/back/leg pain   Objective: Vital signs in last 24 hours: Temp:  [98.3 F (36.8 C)-99.6 F (37.6 C)] 99 F (37.2 C) (11/16 0900) Pulse Rate:  [85-141] 88 (11/16 1000) Resp:  [15-27] 18 (11/16 1000) BP: (104-145)/(47-75) 130/58 (11/16 1000) SpO2:  [93 %-97 %] 96 % (11/16 1000) Last BM Date :  (PTA)  Intake/Output from previous day: 11/15 0701 - 11/16 0700 In: 554.4 [P.O.:170; I.V.:384.4] Out: 1435 [Urine:1425; Emesis/NG output:10] Intake/Output this shift: Total I/O In: 203.3 [P.O.:120; I.V.:83.3] Out: 105 [Urine:105]  General appearance: alert and no distress Resp: clear to auscultation bilaterally Cardio: regular rate and rhythm Extremities: bilateral groin access sites- soft, no hematoma, legs/feet warm  Lab Results:  Recent Labs    07/27/24 0333 07/27/24 1204 07/28/24 0321  WBC 12.6*  --  10.5  HGB 7.7* 8.3* 7.7*  HCT 22.6*  --  24.2*  PLT 130*  --  136*   BMET Recent Labs    07/27/24 1706 07/28/24 0321  NA 142 142  K 3.4* 4.0  CL 107 107  CO2 26 26  GLUCOSE 128* 128*  BUN 21 21  CREATININE 1.25* 1.21*  CALCIUM 8.4* 8.3*   PT/INR Recent Labs    07/25/24 1419 07/25/24 2343  LABPROT 25.6* 14.8  INR 2.2* 1.1   ABG Recent Labs    07/25/24 1541 07/25/24 1807  PHART 7.25* 7.31*  HCO3 20.2 19.1*    Studies/Results: DG Chest Port 1 View Result Date: 07/27/2024 CLINICAL DATA:  Respiratory failure with hypoxia. EXAM: PORTABLE CHEST 1 VIEW COMPARISON:  07/26/2024 FINDINGS: Lungs are hypoinflated with mild hazy bibasilar opacification likely due to atelectasis and possible small amount of bilateral pleural fluid. Infection in the lung bases is possible. Borderline stable cardiomegaly. Remainder of the exam is unchanged. IMPRESSION: Hypoinflation with mild hazy bibasilar opacification likely due to  atelectasis and possible small amount of bilateral pleural fluid. Infection in the lung bases is possible. Electronically Signed   By: Toribio Agreste M.D.   On: 07/27/2024 11:15    Anti-infectives: Anti-infectives (From admission, onward)    Start     Dose/Rate Route Frequency Ordered Stop   07/26/24 0600  ceFAZolin  (ANCEF ) IVPB 2g/100 mL premix        2 g 200 mL/hr over 30 Minutes Intravenous On call to O.R. 07/25/24 1257 07/26/24 0804   07/25/24 2100  ceFAZolin  (ANCEF ) IVPB 2g/100 mL premix        2 g 200 mL/hr over 30 Minutes Intravenous Every 8 hours 07/25/24 1530 07/26/24 0700       Assessment/Plan: s/p Procedure(s) with comments: ENDOVASCULAR STENT GRAFT (AAA) (N/A) - ruptured AAA ENDOVASCULAR STENT GRAFT REPAIR POD #3  Transition to PO Amiodarone Continue Diuresis  Replete electrolytes Monitor HgB- 7.7 PT/OT OOB to chair Encourage Incentive spirometry   LOS: 3 days    Tisa Dakin A 07/28/2024

## 2024-07-28 NOTE — Progress Notes (Signed)
 NAME:  Rose Perry, MRN:  [id number], DOB:  17-Jul-1946, LOS: 3 ADMISSION DATE:  07/25/2024, CONSULTATION DATE:  07/25/24 REFERRING MD:  Dr. Cordella Shawl, CHIEF COMPLAINT:  Abdominal pain   Brief Pt Description / Synopsis:  78 year old female with history of PAD, T2DM, and HTN who presented with abdominal pain found to have a ruptured abdominal aneurysm, admitted with hemorrhagic shock and taken emergency for AAA repair with vascular surgery. Remained intubated post-op due to severe metabolic acidosis.  History of Present Illness:  Rose Perry is a 78 y.o. female who presented to [facility] ED from [facility] walk-in clinic for worsening abdominal pain x 2 days, to which a STAT Abd/Pelvis CT was ordered, resulting with concern for possible ruptured aneurysm. The patient was transported to [facility] ED for further workup. She reported the abdominal pain has been present for 2 weeks but significantly increased over the past two days. Denied shortness of breath and chest pain.  ED Course: Vitals on arrival: Temp 97.8, BP 149/65, HR 74, RR 15, SpO2 95%  Medications Administered: Cefazolin  preoperatively Morphine  NS bolus Zofran  NS gtt  Pertinent Labs/Diagnostics: Initial CBC unremarkable BMP unremarkable Troponin: 20 Lipase 17 11/13 CT abdomen/pelvis: AAA measuring 7cm, as well as a right iliac aneurysm measuring 3.5cm.   Vascular surgery emergently consulted. Upon further evaluation they decided to take her to the vascular lab for AAA repair 11/13. While waiting to go to the OR, she developed severe hypotension (80/69) and tachycardia requiring initiating of norepinephrine, as well as IVF resuscitation. She was emergently taken to the vascular lab, with worsening hypotension requiring phenylephrine , epinephrine  and vasopressin. Massive Transfusion Protocol was initiated and she was mechanically intubated. A total of 6mg  Versed  was given, 3 grams on albumin, calcium chloride 0.25g, 6units  pRBC, 2 units of FFP and 1 unit of platelets. Repeat Hgb was 6.9.   PCCM was consulted for ICU admission due to hypotension requiring vasopressors and mechanical ventilation.  07/28/24: PCCM reconsulted for acute respiratory distress with hypoxemia.  Nurse states that patient was moved from bed to chair and she became acutely dyspneic and hypoxic with SpO2 in the 70s.  She was placed on a nonrebreather but her SpO2 only came up to about 85% and she continued to be dyspneic and also reported mild chest discomfort.  Stat labs and a stat chest x-ray were obtained.  Her ABG on nonrebreather showed a pH of 7.41, pCO2 of 48, pO2 of 52, acid base excess of 5.1 and a bicarb level of 30.4 and an oxygen saturation of 89%.  CBC showed a hemoglobin of 8.1 and a hematocrit of 25.1.  Lactic acid was negative and her troponin was 76.  A chest x-ray showed right lung base infiltrate and pleural effusion.   Pertinent  Medical History  Hypertension Type II Diabetes Mellitus Hyperlipidemia Celiac Artery Stenosis  Significant Hospital Events: Including procedures, antibiotic start and stop dates in addition to other pertinent events   11/13: CT Abd/pelvis identified 7mm AAA concern for rupture, pt decompensated and was emergently taken to the vascular lab. Placed on levo, later requiring vaso, neo and epi during the procedure. MTP initiated, repeat Hgb was 6.9 (previous 13.5). Admitted to the ICU for mechanical ventilation management and hypotension requiring vasopressors. Started on fentanyl  gtt.  11/14: No significant events overnight.  AFebrile, on low dose levophed.  Hgb remained stable overnight, on minimal vent support, WUA & SBT as tolerated.  Diurese with 40 mg + 60 mg  IV Lasix  dose, hopeful for extubation to BiPAP. 11/15: Weaned off Levophed overnight.  Developed tachycardic in afib, rate 140-150s, started on amiodarone gtt. 07/28/24: PCCM reconsulted for acute respiratory distress with hypoxemia; patient  placed on BiPAP  Interim History / Subjective:  Patient was doing well on nasal cannula post extubation.  Today she was moved from bed to chair and became acutely hypoxic and dyspneic requiring BiPAP.  She is being maintained on continuous BiPAP.  Objective    Blood pressure 125/64, pulse (!) 103, temperature 99 F (37.2 C), temperature source Axillary, resp. rate (!) 27, height 5' 7 (1.702 m), weight 107.2 kg, SpO2 93%.    FiO2 (%):  [45 %-65 %] 65 % PEEP:  [5 cmH20] 5 cmH20 Pressure Support:  [5 cmH20] 5 cmH20   Intake/Output Summary (Last 24 hours) at 07/28/2024 1454 Last data filed at 07/28/2024 1423 Gross per 24 hour  Intake 520.21 ml  Output 835 ml  Net -314.79 ml   Filed Weights   07/25/24 1210  Weight: 107.2 kg    Examination: General: Acutely ill appearing female, laying in bed, awake and oriented, in respiratory distress HEENT: atraumatic, normocephalic, supple, no JVD Lungs: Increased work of breathing with use of accessory muscles, diffuse crackles in all lung fields, diminished breath sounds in the bases cardiovascular: Tachycardia, regular rhythm, S1 S2, no m/r/g Abdomen: Central obesity, soft, mild distention, slightly firm upper abdomen. no ttp/rebound/guarding, bowel sounds rare Extremities: warm to touch throughout, dry, intact, radial pulses 2+, distal pulses 1+, trace edema. Bilateteral groin dressings without signs of bleeding or infection Neuro:  AAOX 3, Following commands and appropriately answering questions, no focal neuro deficits noted, PERRLA. EOM intact Psyche: normal affect  Resolved problem list  Mechanical Intubation for Airway Protection Intraoperatively & Metabolic Acidosis Hemorrhagic Shock iso AAA Rupture  Assessment and Plan   ASSESSMENT / PLAN:  PULMONARY A: Acute hypoxic respiratory failure secondary to right lower lobe pneumonia, and bilateral pleural effusions Suspect pulmonary edema given that patient received large amounts of  fluids during surgery Healthcare acquired pneumonia-patient was intubated hence at increased risk for HCAP H/O Tobacco use P:   -Continuous BiPAP and wean as tolerated - Chest x-ray and ABG as needed - Nebulized bronchodilators - Follow-up cultures -Empiric antibiotics with vancomycin and Zosyn  -Repeat MRSA screen, blood cultures and sputum culture - Patient is at high risk for intubation  CARDIOVASCULAR A:  Hemorrhagic Shock s/t AAA Rupture s/p Stent Placement (11/13) -off pressors; blood pressure stable Zofran  R/O Volume overload Chest pain  r/o ACS Afib with RVR started 07/27/2023 requiring amiodarone infusion; now in Sinus tach Hx: Hypertension, HLD, Celiac Artery Stenosis P:  -Continuous cardiac monitoring per ICU protocol -Maintain MAP >65 -Transfusion if hgb < 7.0 -Resume vasopressors if needed -Trend troponin and EKG -2D ECHO -Vascular Surgery following, appreciate input  -Continue amiodarone infusion and  transition to oral  per protocol  RENAL A:   Hypocalcemia  AKI-creatinine trending down P:   -Monitor I&O's / urinary output -Follow BMP -Ensure adequate renal perfusion -Avoid nephrotoxic agents as able -Replace electrolytes as indicated ~ Pharmacy following for assistance with electrolyte replacement  GASTROINTESTINAL A:   GI prophylaxis P:   - Continue Protonix  40mg  IV -Keep NPO while on BIPAP -No BM since admission; bowel regimen  HEMATOLOGIC A:   #Acute Blood Loss Anemia s/t AAA Rupture P:  -Monitor for S/Sx of bleeding -Trend CBC -SCD's for VTE Prophylaxis  -Transfuse for Hgb <7 -Transfuse Platelets for Platelet count <  10K; < 50K with active bleeding; < 100K with Neurosurgical procedures/processes  -DIC ruled out ~ smear negative for schistocytes  -Status post MTP:  Received 7 units pRBCs, 2 units FFP and 1 unit of platelets in OR  INFECTIOUS A:   HCAP P:   -Antibiotics as above - Monitor fever curve - Follow-up  cultures  ENDOCRINE A:   Type II Diabetes Mellitus   P:   - ICU hypo/hyperglycemia protocol - SSI as indicated: Novolog  - CBG Q4h  NEUROLOGIC A:   Acute hypoxic encephalopathy High risk for ICU delirium P:   -Treat underlying hypoxemia with BiPAP as ordered - Monitor neurological status - ICU delirium prevention protocol   Best Practice: Diet/type: N.p.o. while on BiPAP VTE prophylaxis:  SCD's /no pharmacologic VTE prophylaxis due to hemorrhage GI prophylaxis: PPI Lines: Peripheral IV Foley: In place and still needed Code Status: Full code Last date of multidisciplinary goals of care discussion 07/28/2024 spoke at length with patient's daughter at bedside and updated her on patient's current status and diagnostic workup as well as plan of care.  All questions answered.    Critical care time spent titrating BiPAP, reviewing history and labs, CXR, and ABG interpretation is 60 minutes   Euel Castile S. Griffin, PhD, MSN, MPH, ANP-BC [facility] Prefer epic messenger for cross cover needs If after hours, please call E-link  NB: This document was prepared using Dragon voice recognition software and may include unintentional dictation errors.     Labs   CBC: Recent Labs  Lab 07/25/24 1236 07/25/24 1419 07/25/24 2343 07/26/24 0415 07/26/24 1527 07/27/24 0333 07/27/24 1204 07/28/24 0321  WBC 9.8 9.1  --  13.7*  --  12.6*  --  10.5  HGB 13.5 6.9* 8.5* 8.4* 8.4* 7.7* 8.3* 7.7*  HCT 42.9 21.5* 24.6* 24.2* 24.4* 22.6*  --  24.2*  MCV 81.7 86.7  --  81.8  --  83.1  --  86.7  PLT 283 125*  128* 137* 147*  --  130*  --  136*    Basic Metabolic Panel: Recent Labs  Lab 07/25/24 1419 07/26/24 0414 07/26/24 0415 07/27/24 0333 07/27/24 1706 07/28/24 0321  NA 142  --  142 142 142 142  K 3.6  --  3.4* 3.6 3.4* 4.0  CL 111  --  106 107 107 107  CO2 18*  --  24 24 26 26   GLUCOSE 349*  --  131* 144* 128* 128*  BUN 13  --  20 23 21 21   CREATININE  0.71  --  1.53* 1.43* 1.25* 1.21*  CALCIUM 6.4*  --  8.3* 8.3* 8.4* 8.3*  MG  --  1.4*  --  2.0 1.8 1.9  PHOS  --   --   --  3.7 2.5  --    GFR: Estimated Creatinine Clearance: 47.5 mL/min (A) (by C-G formula based on SCr of 1.21 mg/dL (H)). Recent Labs  Lab 07/25/24 1419 07/25/24 2343 07/26/24 0415 07/27/24 0333 07/28/24 0321  WBC 9.1  --  13.7* 12.6* 10.5  LATICACIDVEN  --  2.1*  --   --   --     Liver Function Tests: Recent Labs  Lab 07/25/24 1236 07/27/24 1706  AST 20  --   ALT 14  --   ALKPHOS 83  --   BILITOT 0.4  --   PROT 7.2  --   ALBUMIN 4.0 3.5   Recent Labs  Lab 07/25/24 1236  LIPASE 17  No results for input(s): AMMONIA in the last 168 hours.  ABG    Component Value Date/Time   PHART 7.41 07/28/2024 1419   PCO2ART 48 07/28/2024 1419   PO2ART 52 (L) 07/28/2024 1419   HCO3 30.4 (H) 07/28/2024 1419   ACIDBASEDEF 6.6 (H) 07/25/2024 1807   O2SAT 89 07/28/2024 1419     Coagulation Profile: Recent Labs  Lab 07/25/24 1419 07/25/24 2343  INR 2.2* 1.1    Cardiac Enzymes: No results for input(s): CKTOTAL, CKMB, CKMBINDEX, TROPONINI in the last 168 hours.  HbA1C: Hgb A1c MFr Bld  Date/Time Value Ref Range Status  07/25/2024 12:36 PM 6.5 (H) 4.8 - 5.6 % Final    Comment:    (NOTE) Diagnosis of Diabetes The following HbA1c ranges recommended by the American Diabetes Association (ADA) may be used as an aid in the diagnosis of diabetes mellitus.  Hemoglobin             Suggested A1C NGSP%              Diagnosis  <5.7                   Non Diabetic  5.7-6.4                Pre-Diabetic  >6.4                   Diabetic  <7.0                   Glycemic control for                       adults with diabetes.    08/09/2020 09:19 AM 6.5 (H) 4.8 - 5.6 % Final    Comment:    (NOTE) Pre diabetes:          5.7%-6.4%  Diabetes:              >6.4%  Glycemic control for   <7.0% adults with diabetes     CBG: Recent Labs  Lab  07/25/24 1550 07/25/24 1942 07/25/24 2347 07/26/24 0751 07/26/24 1121  GLUCAP 265* 213* 127* 154* 134*    Review of Systems:   Unable to assess as pt is intubated and sedated   Past Medical History:  She,  has a past medical history of Celiac artery stenosis, CME (cystoid macular edema), CNVM (choroidal neovascular membrane), Diabetes mellitus without complication (HCC), Diverticulosis of colon, Elevated lipids, History of cataract, History of rectal bleeding, adenomatous colonic polyps, Hyperlipidemia, Hypertension, Hypertensive retinopathy of both eyes, Lymphedema, and Primary open angle glaucoma (POAG) of both eyes.   Surgical History:   Past Surgical History:  Procedure Laterality Date   ABDOMINAL HYSTERECTOMY     COLONOSCOPY     COLONOSCOPY WITH PROPOFOL  N/A 08/22/2016   Procedure: COLONOSCOPY WITH PROPOFOL ;  Surgeon: Lamar ONEIDA Holmes, MD;  Location: [facility] ENDOSCOPY;  Service: Endoscopy;  Laterality: N/A;   COLONOSCOPY WITH PROPOFOL  N/A 08/11/2020   Procedure: COLONOSCOPY WITH PROPOFOL ;  Surgeon: Maryruth Ole ONEIDA, MD;  Location: [facility] ENDOSCOPY;  Service: Endoscopy;  Laterality: N/A;   COLONOSCOPY WITH PROPOFOL  N/A 11/12/2021   Procedure: COLONOSCOPY WITH PROPOFOL ;  Surgeon: Maryruth Ole ONEIDA, MD;  Location: [facility] ENDOSCOPY;  Service: Endoscopy;  Laterality: N/A;  DM   COLONOSCOPY WITH PROPOFOL  N/A 08/18/2023   Procedure: COLONOSCOPY WITH PROPOFOL ;  Surgeon: Maryruth Ole ONEIDA, MD;  Location: [facility] ENDOSCOPY;  Service: Endoscopy;  Laterality: N/A;   ENDOVASCULAR  STENT GRAFT (AAA) N/A 07/25/2024   Procedure: ENDOVASCULAR STENT GRAFT (AAA);  Surgeon: Jama Cordella [name], MD;  Location: [facility] INVASIVE CV LAB;  Service: Cardiovascular;  Laterality: N/A;  ruptured AAA   ENDOVASCULAR STENT GRAFT REPAIR  07/25/2024   Procedure: ENDOVASCULAR STENT GRAFT REPAIR;  Surgeon: Jama Cordella [name], MD;  Location: [facility] INVASIVE CV LAB;  Service: Cardiovascular;;   ESOPHAGOGASTRODUODENOSCOPY  (EGD) WITH PROPOFOL  N/A 08/11/2020   Procedure: ESOPHAGOGASTRODUODENOSCOPY (EGD) WITH PROPOFOL ;  Surgeon: Maryruth Ole DASEN, MD;  Location: [facility] ENDOSCOPY;  Service: Endoscopy;  Laterality: N/A;   EYE SURGERY     JOINT REPLACEMENT Left    left total knee replacement   LOWER EXTREMITY ANGIOGRAPHY Right 02/20/2024   Procedure: Lower Extremity Angiography;  Surgeon: Jama Cordella [name], MD;  Location: [facility] INVASIVE CV LAB;  Service: Cardiovascular;  Laterality: Right;   ltkr     needle in foot removed     POLYPECTOMY  08/18/2023   Procedure: POLYPECTOMY;  Surgeon: Maryruth Ole DASEN, MD;  Location: [facility] ENDOSCOPY;  Service: Endoscopy;;   REFRACTIVE SURGERY       Social History:   reports that she has quit smoking. She has never used smokeless tobacco. She reports that she does not drink alcohol and does not use drugs.   Family History:  Her family history includes Coronary artery disease in her brother; Hyperlipidemia in her mother; Hypertension in her father and mother. There is no history of Breast cancer.   Allergies Allergies  Allergen Reactions   Codeine      Home Medications  Prior to Admission medications   Medication Sig Start Date End Date Taking? Authorizing Provider  amLODipine (NORVASC) 5 MG tablet Take 1 tablet by mouth daily. 10/07/20   [provider]  aspirin  EC 81 MG tablet Take 1 tablet (81 mg total) by mouth daily. Swallow whole. 02/21/24 02/20/25  Schnier, Cordella [name], MD  atorvastatin (LIPITOR) 20 MG tablet Take 20 mg by mouth daily.    [provider]  atorvastatin (LIPITOR) 20 MG tablet Take 1 tablet by mouth daily. 05/10/21   [provider]  clopidogrel  (PLAVIX ) 75 MG tablet Take 1 tablet (75 mg total) by mouth daily. 02/21/24   Schnier, Gregory G, MD  metFORMIN (GLUCOPHAGE) 500 MG tablet Take 1 tablet by mouth 2 (two) times daily with a meal. 06/15/20   [provider]  metoprolol  tartrate (LOPRESSOR ) 25 MG tablet Take 0.5 tablets  (12.5 mg total) by mouth 2 (two) times daily. Patient not taking: Reported on 06/17/2024 08/13/20   Josette Ade, MD  Multiple Vitamins-Minerals (SENTRY PO) Take by mouth daily.    [provider]  pantoprazole  (PROTONIX ) 40 MG tablet Take 1 tablet (40 mg total) by mouth daily. Patient not taking: Reported on 06/17/2024 08/13/20 01/25/25  Josette Ade, MD  polyethylene glycol (MIRALAX ) 17 g packet Take 17 g by mouth 2 (two) times daily. Patient not taking: Reported on 06/17/2024 08/14/20   Josette Ade, MD  polyethylene glycol powder (GLYCOLAX /MIRALAX ) 17 GM/SCOOP powder Take by mouth. Patient not taking: Reported on 06/17/2024 03/25/21   [provider]  potassium chloride  (KLOR-CON ) 10 MEQ tablet Take 10 mEq by mouth daily. 05/11/20   [provider]  timolol (TIMOPTIC) 0.5 % ophthalmic solution 1 drop 2 (two) times daily.    [provider]  triamterene-hydrochlorothiazide (MAXZIDE-25) 37.5-25 MG tablet Take 1 tablet by mouth daily. 10/07/23   [provider]

## 2024-07-28 NOTE — Plan of Care (Signed)
  Problem: Education: Goal: Knowledge of General Education information will improve Description: Including pain rating scale, medication(s)/side effects and non-pharmacologic comfort measures Outcome: Progressing   Problem: Clinical Measurements: Goal: Ability to maintain clinical measurements within normal limits will improve Outcome: Progressing Goal: Will remain free from infection Outcome: Progressing Goal: Diagnostic test results will improve Outcome: Progressing Goal: Respiratory complications will improve Outcome: Progressing   Problem: Activity: Goal: Risk for activity intolerance will decrease Outcome: Progressing   Problem: Coping: Goal: Level of anxiety will decrease Outcome: Progressing   Problem: Pain Managment: Goal: General experience of comfort will improve and/or be controlled Outcome: Progressing   Problem: Safety: Goal: Ability to remain free from injury will improve Outcome: Progressing   Problem: Skin Integrity: Goal: Risk for impaired skin integrity will decrease Outcome: Progressing

## 2024-07-28 NOTE — Plan of Care (Signed)
  Problem: Education: Goal: Knowledge of General Education information will improve Description: Including pain rating scale, medication(s)/side effects and non-pharmacologic comfort measures Outcome: Progressing   Problem: Health Behavior/Discharge Planning: Goal: Ability to manage health-related needs will improve Outcome: Progressing   Problem: Clinical Measurements: Goal: Ability to maintain clinical measurements within normal limits will improve Outcome: Progressing Goal: Will remain free from infection Outcome: Progressing Goal: Diagnostic test results will improve Outcome: Progressing Goal: Respiratory complications will improve Outcome: Progressing Goal: Cardiovascular complication will be avoided Outcome: Progressing   Problem: Activity: Goal: Risk for activity intolerance will decrease Outcome: Progressing   Problem: Nutrition: Goal: Adequate nutrition will be maintained Outcome: Progressing   Problem: Coping: Goal: Level of anxiety will decrease Outcome: Progressing   Problem: Elimination: Goal: Will not experience complications related to bowel motility Outcome: Progressing Goal: Will not experience complications related to urinary retention Outcome: Progressing   Problem: Pain Managment: Goal: General experience of comfort will improve and/or be controlled Outcome: Progressing   Problem: Safety: Goal: Ability to remain free from injury will improve Outcome: Progressing   Problem: Skin Integrity: Goal: Risk for impaired skin integrity will decrease Outcome: Progressing   Problem: Education: Goal: Ability to describe self-care measures that may prevent or decrease complications (Diabetes Survival Skills Education) will improve Outcome: Progressing   Problem: Coping: Goal: Ability to adjust to condition or change in health will improve Outcome: Progressing   Problem: Fluid Volume: Goal: Ability to maintain a balanced intake and output will  improve Outcome: Progressing   Problem: Health Behavior/Discharge Planning: Goal: Ability to identify and utilize available resources and services will improve Outcome: Progressing Goal: Ability to manage health-related needs will improve Outcome: Progressing   Problem: Metabolic: Goal: Ability to maintain appropriate glucose levels will improve Outcome: Progressing   Problem: Nutritional: Goal: Maintenance of adequate nutrition will improve Outcome: Progressing Goal: Progress toward achieving an optimal weight will improve Outcome: Progressing   Problem: Skin Integrity: Goal: Risk for impaired skin integrity will decrease Outcome: Progressing   Problem: Tissue Perfusion: Goal: Adequacy of tissue perfusion will improve Outcome: Progressing   Problem: Education: Goal: Knowledge of discharge needs will improve Outcome: Progressing   Problem: Clinical Measurements: Goal: Postoperative complications will be avoided or minimized Outcome: Progressing   Problem: Respiratory: Goal: Will achieve and/or maintain a regular respiratory rate, without signs or symptoms of dyspnea Outcome: Progressing   Problem: Skin Integrity: Goal: Demonstration of wound healing without infection will improve Outcome: Progressing

## 2024-07-28 NOTE — Care Management Important Message (Signed)
 Important Message  Patient Details  Name: Rose Perry MRN: [id number] Date of Birth: 20-Dec-1945   Important Message Given:  Yes - Medicare IM     [name] 07/28/2024, 6:05 PM

## 2024-07-28 NOTE — Progress Notes (Signed)
 Transported patient to and from CT scan without difficulty.

## 2024-07-28 NOTE — Progress Notes (Signed)
 Patient was assisted up to chair by PT this afternoon; she became hypoxic per O2 sats with increased WOB. She experienced prolonged recovery time, with increased O2 needs up to 100% NRB. Hospitalist and ICU NP to bedside. Complained of mild chest discomfort (3/10) that felt mostly like indigestion to her. EKG done. ST with no acute changes noted. EKG viewed by hospitalist. CXR done and IV lasix  60 mg given. Pt assisted back to bed and Bipap placed. Family (son and daughter) also at bedside and all their questions were answered. ABG also done. Urine output post lasix  800 cc so far, foley draining. Pt now appears comfortable, resp rate low 20's; drowsy, but arousable. Denies pain or SOB.

## 2024-07-28 NOTE — Progress Notes (Addendum)
 PROGRESS NOTE    Rose Perry  FMW:[id number] DOB: [birth year] DOA: 07/25/2024 PCP: Marikay Eva POUR, PA  Chief Complaint  Patient presents with   Abnormal Scan    Hospital Course:  78 year old female with history of PAD, T2DM, and HTN who presented with abdominal pain found to have a ruptured abdominal aneurysm, admitted with hemorrhagic shock and taken emergency for AAA repair with vascular surgery.  Patient remained intubated postop due to severe metabolic acidosis.  Hospital course complicated by tachycardia found to have A-fib with RVR, on IV amiodarone Downgraded to TRH for further management on 11/16  Subjective: Patient was examined at the bedside, son and sister present.  New to me today Discussed plan of care with vascular surgery at the bedside  Patient was found to be hypoxic to mid 70s, moving to the chair with PT Found to have crackles and likely volume overload due to MTP and aggressive fluid resuscitation Was placed on NRB, saturating up to 88%.  Placed on BiPAP, PCCM following    Objective: Vitals:   07/28/24 0400 07/28/24 0500 07/28/24 0600 07/28/24 0900  BP: 127/60 (!) 115/53 (!) 136/57 (!) 136/58  Pulse: 98 87 89 90  Resp: (!) 22 16 16 19   Temp:    99 F (37.2 C)  TempSrc:    Axillary  SpO2: 94% 94% 96% 95%  Weight:      Height:        Intake/Output Summary (Last 24 hours) at 07/28/2024 1006 Last data filed at 07/28/2024 0900 Gross per 24 hour  Intake 452.46 ml  Output 1170 ml  Net -717.54 ml   Filed Weights   07/25/24 1210  Weight: 107.2 kg    Examination: General: Acutely ill appearing female, laying in bed, awake and oriented, On BiPap HENT: supple, no JVD Lungs: Fine crackles b/l bases, on BiPAP Cardiovascular: Tachycardia, regular rhythm, S1 S2, no m/r/g Abdomen: soft, mild distention, slightly firm upper abdomen Extremities: warm to touch throughout, dry, intact, radial pulses 2+, distal pulses 1+, trace edema Neuro:  following  commands and appropriately answering questions, no focal neuro deficits noted, PERRL  Assessment & Plan:  Hemorrhagic Shock s/t AAA Rupture s/p Stent Placement (11/13) - resolved - Status post MTP:  Received 7 units pRBCs, 2 units FFP and 1 unit of platelets in OR - Weaned off vasopressors - s/p endovascular stent graft repair - Vascular surgery following  Acute blood loss anemia due to AAA rupture Thrombocytopenia - Status post MTP:  Received 7 units pRBCs, 2 units FFP and 1 unit of platelets in OR - DIC ruled out ~ smear negative for schistocytes  - Hb ~ 8 -Transfuse Hb < 7,  Platelets for Platelet count < 10K; < 50K with active bleeding; < 100K with Neurosurgical procedures/processes   Acute hypoxic respiratory failure - was intubated post operatively, suspect volume overload due to MTP and aggressive fluid resuscitation - Was placed on BiPAP today due to hypoxia - IV diuresis, give Lasix  60 mg IV today - Scheduled nebs due to chronic history of smoking - Incentive Spirometry - PCCM following, CT Chest pending  Afib with RVR (11/14) - On IV Amiodarone - Troponin elevated, pending repeat Troponin - transition to oral when loaded and HR controlled - Echo pending - cardiac monitoring  AKI Hypocalcemia - improved - Cr improving, 1.18 today, baseline Cr 0.7-0.9 - Monitor Cr, avoid nephrotoxic agents  Hypokalemia Hypomagnesemia - Monitor and replete as needed  Type II Diabetes Mellitus -  SSI  Obesity Class II - BMI 37 - Outpatient follow up for lifestyle modification and risk factor management   DVT prophylaxis: SCD   Code Status: Full Code Disposition:  TBD  Consultants:  Treatment Team:  Consulting Physician: Rose Selinda RAMAN, MD Consulting Physician: Jerelene Critchley, MD  Procedures:  s/p endovascular stent graft repair  Antimicrobials:  Anti-infectives (From admission, onward)    Start     Dose/Rate Route Frequency Ordered Stop   07/26/24 0600  ceFAZolin   (ANCEF ) IVPB 2g/100 mL premix        2 g 200 mL/hr over 30 Minutes Intravenous On call to O.R. 07/25/24 1257 07/26/24 0804   07/25/24 2100  ceFAZolin  (ANCEF ) IVPB 2g/100 mL premix        2 g 200 mL/hr over 30 Minutes Intravenous Every 8 hours 07/25/24 1530 07/26/24 0700       Data Reviewed: I have personally reviewed following labs and imaging studies CBC: Recent Labs  Lab 07/25/24 1236 07/25/24 1419 07/25/24 2343 07/26/24 0415 07/26/24 1527 07/27/24 0333 07/27/24 1204 07/28/24 0321  WBC 9.8 9.1  --  13.7*  --  12.6*  --  10.5  HGB 13.5 6.9* 8.5* 8.4* 8.4* 7.7* 8.3* 7.7*  HCT 42.9 21.5* 24.6* 24.2* 24.4* 22.6*  --  24.2*  MCV 81.7 86.7  --  81.8  --  83.1  --  86.7  PLT 283 125*  128* 137* 147*  --  130*  --  136*   Basic Metabolic Panel: Recent Labs  Lab 07/25/24 1419 07/26/24 0414 07/26/24 0415 07/27/24 0333 07/27/24 1706 07/28/24 0321  NA 142  --  142 142 142 142  K 3.6  --  3.4* 3.6 3.4* 4.0  CL 111  --  106 107 107 107  CO2 18*  --  24 24 26 26   GLUCOSE 349*  --  131* 144* 128* 128*  BUN 13  --  20 23 21 21   CREATININE 0.71  --  1.53* 1.43* 1.25* 1.21*  CALCIUM 6.4*  --  8.3* 8.3* 8.4* 8.3*  MG  --  1.4*  --  2.0 1.8 1.9  PHOS  --   --   --  3.7 2.5  --    GFR: Estimated Creatinine Clearance: 47.5 mL/min (A) (by C-G formula based on SCr of 1.21 mg/dL (H)). Liver Function Tests: Recent Labs  Lab 07/25/24 1236 07/27/24 1706  AST 20  --   ALT 14  --   ALKPHOS 83  --   BILITOT 0.4  --   PROT 7.2  --   ALBUMIN 4.0 3.5   CBG: Recent Labs  Lab 07/25/24 1550 07/25/24 1942 07/25/24 2347 07/26/24 0751 07/26/24 1121  GLUCAP 265* 213* 127* 154* 134*    Recent Results (from the past 240 hours)  MRSA Next Gen by PCR, Nasal     Status: None   Collection Time: 07/25/24  3:33 PM   Specimen: Nasal Mucosa; Nasal Swab  Result Value Ref Range Status   MRSA by PCR Next Gen NOT DETECTED NOT DETECTED Final    Comment: (NOTE) The GeneXpert MRSA Assay (FDA  approved for NASAL specimens only), is one component of a comprehensive MRSA colonization surveillance program. It is not intended to diagnose MRSA infection nor to guide or monitor treatment for MRSA infections. Test performance is not FDA approved in patients less than 69 years old. Performed at [facility], [address], [place], [place] [address]   MRSA Next Gen  by PCR, Nasal     Status: None   Collection Time: 07/25/24  3:57 PM   Specimen: Nasal Mucosa; Nasal Swab  Result Value Ref Range Status   MRSA by PCR Next Gen NOT DETECTED NOT DETECTED Final    Comment: (NOTE) The GeneXpert MRSA Assay (FDA approved for NASAL specimens only), is one component of a comprehensive MRSA colonization surveillance program. It is not intended to diagnose MRSA infection nor to guide or monitor treatment for MRSA infections. Test performance is not FDA approved in patients less than 92 years old. Performed at [facility], [address], [place], [place] [address]      Radiology Studies: [facility] Chest [place] 1 View Result Date: 07/27/2024 CLINICAL DATA:  Respiratory failure with hypoxia. EXAM: PORTABLE CHEST 1 VIEW COMPARISON:  07/26/2024 FINDINGS: Lungs are hypoinflated with mild hazy bibasilar opacification likely due to atelectasis and possible small amount of bilateral pleural fluid. Infection in the lung bases is possible. Borderline stable cardiomegaly. Remainder of the exam is unchanged. IMPRESSION: Hypoinflation with mild hazy bibasilar opacification likely due to atelectasis and possible small amount of bilateral pleural fluid. Infection in the lung bases is possible. Electronically Signed   By: Toribio Agreste M.D.   On: 07/27/2024 11:15    Scheduled Meds:  Chlorhexidine Gluconate Cloth  6 each Topical Daily   docusate sodium  100 mg Oral Daily   fentaNYL  (SUBLIMAZE ) injection  25-50 mcg Intravenous Once   insulin  aspart  0-15 Units Subcutaneous TID WC   insulin  aspart   0-5 Units Subcutaneous QHS   pantoprazole  (PROTONIX ) IV  40 mg Intravenous QHS   sodium chloride  flush  3 mL Intravenous Q12H   Continuous Infusions:  amiodarone 30 mg/hr (07/28/24 0810)   fentaNYL  infusion INTRAVENOUS Stopped (07/26/24 0825)     LOS: 3 days  MDM: Patient is high risk for one or more organ failure.  They necessitate ongoing hospitalization for continued IV therapies and subsequent lab monitoring. Total time spent interpreting labs and vitals, reviewing the medical record, coordinating care amongst consultants and care team members, directly assessing and discussing care with the patient and/or family: 55 min Laree Lock, MD [facility]  To contact the attending physician between 7A-7P please use Epic Chat. To contact the covering physician during after hours 7P-7A, please review Amion.  07/28/2024, 10:06 AM   *This document has been created with the assistance of dictation software. Please excuse typographical errors. *

## 2024-07-28 NOTE — Consult Note (Signed)
 Pharmacy Antibiotic Note  Rose Perry is a 78 y.o. female admitted on 07/25/2024 with abdominal pain, now with concerns of pneumonia.  Pharmacy has been consulted for Vancomycin and Zosyn  dosing.  Plan: Vancomycin 2g IV x 1 as loading dose, followed by: Vancomycin 1000 mg IV Q 24 hrs. Goal AUC 400-550. Expected AUC: 524.0 Expected Cmin: 14.3 SCr used: 1.18, Vd used: 0.5  Zosyn  3.375g IV q8h (4 hour infusion).  Height: 5' 7 (170.2 cm) Weight: 107.2 kg (236 lb 5.3 oz) IBW/kg (Calculated) : 61.6  Temp (24hrs), Avg:98.4 F (36.9 C), Min:97.9 F (36.6 C), Max:99 F (37.2 C)  Recent Labs  Lab 07/25/24 1419 07/25/24 2343 07/26/24 0415 07/27/24 0333 07/27/24 1706 07/28/24 0321 07/28/24 1459  WBC 9.1  --  13.7* 12.6*  --  10.5 10.0  CREATININE 0.71  --  1.53* 1.43* 1.25* 1.21* 1.18*  LATICACIDVEN  --  2.1*  --   --   --   --  1.0    Estimated Creatinine Clearance: 48.7 mL/min (A) (by C-G formula based on SCr of 1.18 mg/dL (H)).    Allergies  Allergen Reactions   Codeine     Antimicrobials this admission: Vancomycin 11/16 >>  Zosyn  11/16 >>   Dose adjustments this admission: N/A  Microbiology results: 11/16 BCx: pending 11/16 Sputum: pending  11/13 MRSA PCR: pending  Thank you for allowing pharmacy to be a part of this patient's care.  Nillie Bartolotta A Percival Glasheen 07/28/2024 7:03 PM

## 2024-07-29 ENCOUNTER — Inpatient Hospital Stay

## 2024-07-29 ENCOUNTER — Encounter
Admission: EM | Disposition: A | Discharge status: Skilled Nursing Facility | Payer: Self-pay | Source: Ambulatory Visit | Attending: Internal Medicine

## 2024-07-29 ENCOUNTER — Inpatient Hospital Stay: Admit: 2024-07-29 | Discharge: 2024-07-29 | Disposition: A | Attending: Adult Health

## 2024-07-29 ENCOUNTER — Inpatient Hospital Stay: Admit: 2024-07-29

## 2024-07-29 DIAGNOSIS — I713 Abdominal aortic aneurysm, ruptured, unspecified: Secondary | ICD-10-CM | POA: Diagnosis not present

## 2024-07-29 DIAGNOSIS — J9601 Acute respiratory failure with hypoxia: Secondary | ICD-10-CM | POA: Diagnosis not present

## 2024-07-29 DIAGNOSIS — E8721 Acute metabolic acidosis: Secondary | ICD-10-CM | POA: Diagnosis not present

## 2024-07-29 DIAGNOSIS — R571 Hypovolemic shock: Secondary | ICD-10-CM | POA: Diagnosis not present

## 2024-07-29 HISTORY — PX: CENTRAL LINE INSERTION: CATH118232

## 2024-07-29 HISTORY — PX: IVC FILTER INSERTION: CATH118245

## 2024-07-29 HISTORY — PX: PULMONARY THROMBECTOMY: CATH118295

## 2024-07-29 LAB — CBC
HCT: 24.5 % — ABNORMAL LOW (ref 36.0–46.0)
HCT: 22.6 % — ABNORMAL LOW (ref 36.0–46.0)
Hemoglobin: 7.8 g/dL — ABNORMAL LOW (ref 12.0–15.0)
Hemoglobin: 7.1 g/dL — ABNORMAL LOW (ref 12.0–15.0)
MCH: 28.3 pg (ref 26.0–34.0)
MCH: 27.6 pg (ref 26.0–34.0)
MCHC: 31.8 g/dL (ref 30.0–36.0)
MCHC: 31.4 g/dL (ref 30.0–36.0)
MCV: 88.8 fL (ref 80.0–100.0)
MCV: 87.9 fL (ref 80.0–100.0)
Platelets: 140 K/uL — ABNORMAL LOW (ref 150–400)
Platelets: 135 K/uL — ABNORMAL LOW (ref 150–400)
RBC: 2.76 MIL/uL — ABNORMAL LOW (ref 3.87–5.11)
RBC: 2.57 MIL/uL — ABNORMAL LOW (ref 3.87–5.11)
RDW: 16.7 % — ABNORMAL HIGH (ref 11.5–15.5)
RDW: 16.7 % — ABNORMAL HIGH (ref 11.5–15.5)
WBC: 5.2 K/uL (ref 4.0–10.5)
WBC: 7.4 K/uL (ref 4.0–10.5)
nRBC: 2.1 % — ABNORMAL HIGH (ref 0.0–0.2)
nRBC: 2 % — ABNORMAL HIGH (ref 0.0–0.2)

## 2024-07-29 LAB — BPAM RBC
Blood Product Expiration Date: 202512082359
Blood Product Expiration Date: 202512082359
Blood Product Expiration Date: 202512112359
Blood Product Expiration Date: 202512112359
Blood Product Expiration Date: 202512112359
Blood Product Expiration Date: 202512112359
Blood Product Expiration Date: 202512112359
Blood Product Expiration Date: 202512122359
Blood Product Expiration Date: 202512122359
Blood Product Expiration Date: 202512122359
Blood Product Expiration Date: 202512122359
Blood Product Expiration Date: 202512162359
ISSUE DATE / TIME: 202511131323
ISSUE DATE / TIME: 202511131323
ISSUE DATE / TIME: 202511131323
ISSUE DATE / TIME: 202511131323
ISSUE DATE / TIME: 202511131353
ISSUE DATE / TIME: 202511131353
ISSUE DATE / TIME: 202511131353
ISSUE DATE / TIME: 202511160952
Unit Type and Rh: 5100
Unit Type and Rh: 5100
Unit Type and Rh: 5100
Unit Type and Rh: 5100
Unit Type and Rh: 5100
Unit Type and Rh: 5100
Unit Type and Rh: 5100
Unit Type and Rh: 5100
Unit Type and Rh: 5100
Unit Type and Rh: 7300
Unit Type and Rh: 7300
Unit Type and Rh: 7300

## 2024-07-29 LAB — PREPARE FRESH FROZEN PLASMA
Unit division: 0
Unit division: 0
Unit division: 0
Unit division: 0
Unit division: 0
Unit division: 0

## 2024-07-29 LAB — TYPE AND SCREEN
ABO/RH(D): B POS
Antibody Screen: NEGATIVE
Unit division: 0
Unit division: 0
Unit division: 0
Unit division: 0
Unit division: 0
Unit division: 0
Unit division: 0
Unit division: 0
Unit division: 0
Unit division: 0
Unit division: 0
Unit division: 0

## 2024-07-29 LAB — COMPREHENSIVE METABOLIC PANEL WITH GFR
ALT: 17 U/L (ref 0–44)
ALT: 27 U/L (ref 0–44)
AST: 102 U/L — ABNORMAL HIGH (ref 15–41)
AST: 99 U/L — ABNORMAL HIGH (ref 15–41)
Albumin: 3.4 g/dL — ABNORMAL LOW (ref 3.5–5.0)
Albumin: 3.1 g/dL — ABNORMAL LOW (ref 3.5–5.0)
Alkaline Phosphatase: 54 U/L (ref 38–126)
Alkaline Phosphatase: 60 U/L (ref 38–126)
Anion gap: 9 (ref 5–15)
Anion gap: 10 (ref 5–15)
BUN: 25 mg/dL — ABNORMAL HIGH (ref 8–23)
BUN: 25 mg/dL — ABNORMAL HIGH (ref 8–23)
CO2: 27 mmol/L (ref 22–32)
CO2: 29 mmol/L (ref 22–32)
Calcium: 8.6 mg/dL — ABNORMAL LOW (ref 8.9–10.3)
Calcium: 8.4 mg/dL — ABNORMAL LOW (ref 8.9–10.3)
Chloride: 106 mmol/L (ref 98–111)
Chloride: 102 mmol/L (ref 98–111)
Creatinine, Ser: 1.13 mg/dL — ABNORMAL HIGH (ref 0.44–1.00)
Creatinine, Ser: 1.3 mg/dL — ABNORMAL HIGH (ref 0.44–1.00)
GFR, Estimated: 49 mL/min — ABNORMAL LOW (ref >60)
GFR, Estimated: 42 mL/min — ABNORMAL LOW (ref >60)
Glucose, Bld: 140 mg/dL — ABNORMAL HIGH (ref 70–99)
Glucose, Bld: 154 mg/dL — ABNORMAL HIGH (ref 70–99)
Potassium: 3.7 mmol/L (ref 3.5–5.1)
Potassium: 3.3 mmol/L — ABNORMAL LOW (ref 3.5–5.1)
Sodium: 142 mmol/L (ref 135–145)
Sodium: 140 mmol/L (ref 135–145)
Total Bilirubin: 0.7 mg/dL (ref 0.0–1.2)
Total Bilirubin: 0.6 mg/dL (ref 0.0–1.2)
Total Protein: 5.7 g/dL — ABNORMAL LOW (ref 6.5–8.1)
Total Protein: 5.4 g/dL — ABNORMAL LOW (ref 6.5–8.1)

## 2024-07-29 LAB — BPAM FFP
Blood Product Expiration Date: 202511152359
Blood Product Expiration Date: 202511152359
Blood Product Expiration Date: 202511152359
Blood Product Expiration Date: 202511152359
Blood Product Expiration Date: 202511182359
Blood Product Expiration Date: 202511182359
Blood Product Expiration Date: 202511182359
Blood Product Expiration Date: 202511182359
Blood Product Expiration Date: 202511182359
Blood Product Expiration Date: 202511182359
Blood Product Expiration Date: 202511182359
ISSUE DATE / TIME: 202511131330
ISSUE DATE / TIME: 202511131330
ISSUE DATE / TIME: 202511131330
ISSUE DATE / TIME: 202511131330
ISSUE DATE / TIME: 202511131523
ISSUE DATE / TIME: 202511131523
ISSUE DATE / TIME: 202511131523
ISSUE DATE / TIME: 202511131523
Unit Type and Rh: 2800
Unit Type and Rh: 2800
Unit Type and Rh: 7300
Unit Type and Rh: 7300
Unit Type and Rh: 8400
Unit Type and Rh: 8400
Unit Type and Rh: 8400
Unit Type and Rh: 8400
Unit Type and Rh: 8400
Unit Type and Rh: 8400
Unit Type and Rh: 8400

## 2024-07-29 LAB — ECHOCARDIOGRAM COMPLETE
AR max vel: 2.5 cm2
AV Area VTI: 2.62 cm2
AV Area mean vel: 2.35 cm2
AV Mean grad: 4 mmHg
AV Peak grad: 8.3 mmHg
Ao pk vel: 1.44 m/s
Area-P 1/2: 3.76 cm2
Height: 67 in
MV VTI: 2.56 cm2
S' Lateral: 2.7 cm
Weight: 3781.33 [oz_av]

## 2024-07-29 LAB — PROTIME-INR
INR: 1.1 (ref 0.8–1.2)
Prothrombin Time: 15.2 s (ref 11.4–15.2)

## 2024-07-29 LAB — GLUCOSE, CAPILLARY
Glucose-Capillary: 127 mg/dL — ABNORMAL HIGH (ref 70–99)
Glucose-Capillary: 140 mg/dL — ABNORMAL HIGH (ref 70–99)
Glucose-Capillary: 157 mg/dL — ABNORMAL HIGH (ref 70–99)
Glucose-Capillary: 124 mg/dL — ABNORMAL HIGH (ref 70–99)
Glucose-Capillary: 134 mg/dL — ABNORMAL HIGH (ref 70–99)
Glucose-Capillary: 135 mg/dL — ABNORMAL HIGH (ref 70–99)
Glucose-Capillary: 135 mg/dL — ABNORMAL HIGH (ref 70–99)
Glucose-Capillary: 137 mg/dL — ABNORMAL HIGH (ref 70–99)
Glucose-Capillary: 140 mg/dL — ABNORMAL HIGH (ref 70–99)
Glucose-Capillary: 142 mg/dL — ABNORMAL HIGH (ref 70–99)
Glucose-Capillary: 142 mg/dL — ABNORMAL HIGH (ref 70–99)
Glucose-Capillary: 142 mg/dL — ABNORMAL HIGH (ref 70–99)
Glucose-Capillary: 142 mg/dL — ABNORMAL HIGH (ref 70–99)
Glucose-Capillary: 150 mg/dL — ABNORMAL HIGH (ref 70–99)
Glucose-Capillary: 152 mg/dL — ABNORMAL HIGH (ref 70–99)
Glucose-Capillary: 154 mg/dL — ABNORMAL HIGH (ref 70–99)
Glucose-Capillary: 163 mg/dL — ABNORMAL HIGH (ref 70–99)
Glucose-Capillary: 144 mg/dL — ABNORMAL HIGH (ref 70–99)
Glucose-Capillary: 146 mg/dL — ABNORMAL HIGH (ref 70–99)
Glucose-Capillary: 155 mg/dL — ABNORMAL HIGH (ref 70–99)
Glucose-Capillary: 142 mg/dL — ABNORMAL HIGH (ref 70–99)

## 2024-07-29 LAB — TROPONIN T, HIGH SENSITIVITY
Troponin T High Sensitivity: 2159 ng/L (ref 0–19)
Troponin T High Sensitivity: 1977 ng/L (ref 0–19)

## 2024-07-29 LAB — BLOOD GAS, VENOUS
Acid-Base Excess: 4.5 mmol/L — ABNORMAL HIGH (ref 0.0–2.0)
Bicarbonate: 30.4 mmol/L — ABNORMAL HIGH (ref 20.0–28.0)
Delivery systems: POSITIVE
FIO2: 80 %
O2 Saturation: 92.5 %
PEEP: 5 cmH2O
Patient temperature: 37
Pressure support: 5 cmH2O
pCO2, Ven: 49 mmHg (ref 44–60)
pH, Ven: 7.4 (ref 7.25–7.43)
pO2, Ven: 58 mmHg — ABNORMAL HIGH (ref 32–45)

## 2024-07-29 LAB — MAGNESIUM
Magnesium: 2.2 mg/dL (ref 1.7–2.4)
Magnesium: 2.1 mg/dL (ref 1.7–2.4)

## 2024-07-29 LAB — APTT: aPTT: 34 s (ref 24–36)

## 2024-07-29 LAB — LACTIC ACID, PLASMA
Lactic Acid, Venous: 1.9 mmol/L (ref 0.5–1.9)
Lactic Acid, Venous: 1 mmol/L (ref 0.5–1.9)

## 2024-07-29 LAB — PHOSPHORUS: Phosphorus: 2.7 mg/dL (ref 2.5–4.6)

## 2024-07-29 LAB — PRO BRAIN NATRIURETIC PEPTIDE: Pro Brain Natriuretic Peptide: 5049 pg/mL — ABNORMAL HIGH (ref <300.0)

## 2024-07-29 LAB — PREPARE RBC (CROSSMATCH)

## 2024-07-29 SURGERY — PULMONARY THROMBECTOMY
Anesthesia: Moderate Sedation

## 2024-07-29 MED ORDER — MIDAZOLAM HCL 2 MG/2ML IJ SOLN
INTRAMUSCULAR | Status: AC
  Filled 2024-07-29: qty 4

## 2024-07-29 MED ORDER — MIDAZOLAM HCL (PF) 2 MG/2ML IJ SOLN
INTRAMUSCULAR | Status: DC | PRN
  Administered 2024-07-29 (×2): 1 mg via INTRAVENOUS

## 2024-07-29 MED ORDER — IOHEXOL 350 MG/ML SOLN
75.0000 mL | Freq: Once | INTRAVENOUS | Status: AC | PRN
  Administered 2024-07-29: 75 mL via INTRAVENOUS

## 2024-07-29 MED ORDER — FENTANYL CITRATE (PF) 100 MCG/2ML IJ SOLN
INTRAMUSCULAR | Status: AC
  Filled 2024-07-29: qty 2

## 2024-07-29 MED ORDER — DORZOLAMIDE HCL-TIMOLOL MAL 2-0.5 % OP SOLN
1.0000 [drp] | Freq: Two times a day (BID) | OPHTHALMIC | Status: DC
  Administered 2024-07-29 – 2024-08-06 (×15): 1 [drp] via OPHTHALMIC
  Filled 2024-07-29 (×2): qty 10

## 2024-07-29 MED ORDER — BOOST / RESOURCE BREEZE PO LIQD CUSTOM: 1.0000 | Freq: Three times a day (TID) | ORAL | Status: DC

## 2024-07-29 MED ORDER — CEFAZOLIN SODIUM-DEXTROSE 2-4 GM/100ML-% IV SOLN
INTRAVENOUS | Status: AC
Start: 2024-07-29 — End: 2024-07-29
  Filled 2024-07-29: qty 100

## 2024-07-29 MED ORDER — HEPARIN (PORCINE) IN NACL 1000-0.9 UT/500ML-% IV SOLN
INTRAVENOUS | Status: DC | PRN
  Administered 2024-07-29: 1000 mL

## 2024-07-29 MED ORDER — HYALURONIDASE HUMAN 150 UNIT/ML IJ SOLN
150.0000 [IU] | Freq: Once | INTRAMUSCULAR | Status: AC
  Administered 2024-07-29: 150 [IU] via SUBCUTANEOUS
  Filled 2024-07-29: qty 1

## 2024-07-29 MED ORDER — ONDANSETRON HCL 4 MG/2ML IJ SOLN
INTRAMUSCULAR | Status: AC
  Filled 2024-07-29: qty 2

## 2024-07-29 MED ORDER — POTASSIUM CHLORIDE 10 MEQ/50ML IV SOLN
10.0000 meq | INTRAVENOUS | Status: AC
  Administered 2024-07-29 – 2024-07-30 (×3): 10 meq via INTRAVENOUS
  Filled 2024-07-29 (×3): qty 50

## 2024-07-29 MED ORDER — HEPARIN (PORCINE) 25000 UT/250ML-% IV SOLN
1550.0000 [IU]/h | INTRAVENOUS | Status: DC
  Administered 2024-07-29 – 2024-07-31 (×3): 1350 [IU]/h via INTRAVENOUS
  Administered 2024-08-01: 1550 [IU]/h via INTRAVENOUS
  Administered 2024-08-01: 1500 [IU]/h via INTRAVENOUS
  Filled 2024-07-29 (×4): qty 250

## 2024-07-29 MED ORDER — DIPHENHYDRAMINE HCL 50 MG/ML IJ SOLN: 50.0000 mg | Freq: Once | INTRAMUSCULAR | Status: DC | PRN

## 2024-07-29 MED ORDER — POLYETHYLENE GLYCOL 3350 17 G PO PACK
17.0000 g | PACK | Freq: Two times a day (BID) | ORAL | Status: DC
  Administered 2024-07-30 – 2024-08-06 (×13): 17 g via ORAL
  Filled 2024-07-29 (×14): qty 1

## 2024-07-29 MED ORDER — SODIUM CHLORIDE 0.9 % IV SOLN: INTRAVENOUS | Status: DC

## 2024-07-29 MED ORDER — HEPARIN SODIUM (PORCINE) 1000 UNIT/ML IJ SOLN
INTRAMUSCULAR | Status: DC | PRN
  Administered 2024-07-29: 6000 [IU] via INTRAVENOUS

## 2024-07-29 MED ORDER — POTASSIUM CHLORIDE 10 MEQ/100ML IV SOLN
10.0000 meq | INTRAVENOUS | Status: AC
  Administered 2024-07-29 (×2): 10 meq via INTRAVENOUS
  Filled 2024-07-29 (×2): qty 100

## 2024-07-29 MED ORDER — HEPARIN SODIUM (PORCINE) 1000 UNIT/ML IJ SOLN
INTRAMUSCULAR | Status: AC
Start: 2024-07-29 — End: 2024-07-29
  Filled 2024-07-29: qty 10

## 2024-07-29 MED ORDER — METHYLPREDNISOLONE SODIUM SUCC 125 MG IJ SOLR: 125.0000 mg | Freq: Once | INTRAMUSCULAR | Status: DC | PRN

## 2024-07-29 MED ORDER — FUROSEMIDE 10 MG/ML IJ SOLN: 40.0000 mg | Freq: Two times a day (BID) | INTRAMUSCULAR | Status: DC

## 2024-07-29 MED ORDER — CEFAZOLIN SODIUM-DEXTROSE 2-4 GM/100ML-% IV SOLN
2.0000 g | INTRAVENOUS | Status: AC
  Administered 2024-07-29: 2 g via INTRAVENOUS

## 2024-07-29 MED ORDER — HEPARIN BOLUS VIA INFUSION
5000.0000 [IU] | Freq: Once | INTRAVENOUS | Status: DC
  Filled 2024-07-29: qty 5000

## 2024-07-29 MED ORDER — FENTANYL CITRATE (PF) 100 MCG/2ML IJ SOLN
INTRAMUSCULAR | Status: DC | PRN
  Administered 2024-07-29 (×2): 25 ug via INTRAVENOUS

## 2024-07-29 MED ORDER — LIDOCAINE HCL (PF) 1 % IJ SOLN
INTRAMUSCULAR | Status: DC | PRN
  Administered 2024-07-29: 10 mL
  Administered 2024-07-29: 5 mL

## 2024-07-29 MED ORDER — HEPARIN (PORCINE) 25000 UT/250ML-% IV SOLN
1350.0000 [IU]/h | INTRAVENOUS | Status: DC
  Filled 2024-07-29: qty 250

## 2024-07-29 MED ORDER — FAMOTIDINE 20 MG PO TABS: 40.0000 mg | ORAL_TABLET | Freq: Once | ORAL | Status: DC | PRN

## 2024-07-29 MED ORDER — ONDANSETRON HCL 4 MG/2ML IJ SOLN
INTRAMUSCULAR | Status: DC | PRN
  Administered 2024-07-29: 4 mg via INTRAVENOUS

## 2024-07-29 MED ORDER — MIDAZOLAM HCL 2 MG/ML PO SYRP: 8.0000 mg | ORAL_SOLUTION | Freq: Once | ORAL | Status: DC | PRN

## 2024-07-29 MED ORDER — CEFAZOLIN SODIUM-DEXTROSE 2-4 GM/100ML-% IV SOLN: 2.0000 g | INTRAVENOUS | Status: DC

## 2024-07-29 SURGICAL SUPPLY — 24 items
CANISTER PENUMBRA ENGINE (MISCELLANEOUS) IMPLANT
CATH ANGIO 5F 100CM .035 PIG (CATHETERS) IMPLANT
CATH BEACON TIP VERT 5FR 125 (CATHETERS) IMPLANT
CATH INDIGO SEP 8 (CATHETERS) IMPLANT
CATH LIGHTNING 8 XTORQ 115 (CATHETERS) IMPLANT
CATH MARINER BERN 150 5FR (CATHETERS) IMPLANT
CATH SELECT H1 TIP 5F 130 (CATHETERS) IMPLANT
CLOSURE PERCLOSE PROSTYLE (Vascular Products) IMPLANT
DEVICE TORQUE (MISCELLANEOUS) IMPLANT
GLIDEWIRE ANGLED SS 035X260CM (WIRE) IMPLANT
KIT CV MULTILUMEN 7FR 20 (SET/KITS/TRAYS/PACK) IMPLANT
KIT FEMORAL DEL DENALI (Miscellaneous) IMPLANT
NDL ENTRY 21GA 7CM ECHOTIP (NEEDLE) IMPLANT
NEEDLE ENTRY 21GA 7CM ECHOTIP (NEEDLE) IMPLANT
PACK ANGIOGRAPHY (CUSTOM PROCEDURE TRAY) ×3 IMPLANT
SET INTRO CAPELLA COAXIAL (SET/KITS/TRAYS/PACK) IMPLANT
SHEATH 9FRX11 (SHEATH) IMPLANT
SHEATH BRITE TIP 6FRX11 (SHEATH) IMPLANT
SUT MNCRL AB 4-0 PS2 18 (SUTURE) IMPLANT
SUT SILK 0 FSL (SUTURE) IMPLANT
SYR MEDRAD MARK 7 150ML (SYRINGE) IMPLANT
TUBING CONTRAST HIGH PRESS 72 (TUBING) IMPLANT
WIRE AMPLATZ SSTIFF .035X260CM (WIRE) IMPLANT
WIRE J 3MM .035X145CM (WIRE) IMPLANT

## 2024-07-29 NOTE — Plan of Care (Signed)
  Problem: Education: Goal: Knowledge of General Education information will improve Description: Including pain rating scale, medication(s)/side effects and non-pharmacologic comfort measures Outcome: Progressing   Problem: Health Behavior/Discharge Planning: Goal: Ability to manage health-related needs will improve Outcome: Progressing   Problem: Clinical Measurements: Goal: Ability to maintain clinical measurements within normal limits will improve Outcome: Progressing Goal: Will remain free from infection Outcome: Progressing Goal: Diagnostic test results will improve Outcome: Progressing Goal: Respiratory complications will improve Outcome: Progressing Goal: Cardiovascular complication will be avoided Outcome: Progressing   Problem: Activity: Goal: Risk for activity intolerance will decrease Outcome: Progressing   Problem: Nutrition: Goal: Adequate nutrition will be maintained Outcome: Progressing   Problem: Coping: Goal: Level of anxiety will decrease Outcome: Progressing   Problem: Elimination: Goal: Will not experience complications related to bowel motility Outcome: Progressing Goal: Will not experience complications related to urinary retention Outcome: Progressing   Problem: Pain Managment: Goal: General experience of comfort will improve and/or be controlled Outcome: Progressing   Problem: Safety: Goal: Ability to remain free from injury will improve Outcome: Progressing   Problem: Skin Integrity: Goal: Risk for impaired skin integrity will decrease Outcome: Progressing   Problem: Education: Goal: Ability to describe self-care measures that may prevent or decrease complications (Diabetes Survival Skills Education) will improve Outcome: Progressing   Problem: Coping: Goal: Ability to adjust to condition or change in health will improve Outcome: Progressing   Problem: Fluid Volume: Goal: Ability to maintain a balanced intake and output will  improve Outcome: Progressing   Problem: Nutritional: Goal: Maintenance of adequate nutrition will improve Outcome: Progressing Goal: Progress toward achieving an optimal weight will improve Outcome: Progressing   Problem: Skin Integrity: Goal: Risk for impaired skin integrity will decrease Outcome: Progressing   Problem: Tissue Perfusion: Goal: Adequacy of tissue perfusion will improve Outcome: Progressing   Problem: Respiratory: Goal: Will achieve and/or maintain a regular respiratory rate, without signs or symptoms of dyspnea Outcome: Progressing   Problem: Skin Integrity: Goal: Demonstration of wound healing without infection will improve Outcome: Progressing

## 2024-07-29 NOTE — Plan of Care (Signed)
  Problem: Education: Goal: Knowledge of General Education information will improve Description: Including pain rating scale, medication(s)/side effects and non-pharmacologic comfort measures Outcome: Progressing   Problem: Health Behavior/Discharge Planning: Goal: Ability to manage health-related needs will improve Outcome: Progressing   Problem: Clinical Measurements: Goal: Ability to maintain clinical measurements within normal limits will improve Outcome: Progressing Goal: Diagnostic test results will improve Outcome: Progressing Goal: Respiratory complications will improve Outcome: Progressing Goal: Cardiovascular complication will be avoided Outcome: Progressing   Problem: Activity: Goal: Risk for activity intolerance will decrease Outcome: Progressing   Problem: Coping: Goal: Level of anxiety will decrease Outcome: Progressing   Problem: Elimination: Goal: Will not experience complications related to urinary retention Outcome: Progressing   Problem: Safety: Goal: Ability to remain free from injury will improve Outcome: Progressing

## 2024-07-29 NOTE — Progress Notes (Signed)
 NAME:  Rose Perry, MRN:  [id number], DOB:  Apr 14, 1946, LOS: 4 ADMISSION DATE:  07/25/2024, CONSULTATION DATE:  07/25/24 REFERRING MD:  Dr. Cordella Perry, CHIEF COMPLAINT:  Abdominal pain   Brief Pt Description / Synopsis:  78 year old female with history of PAD, T2DM, and HTN who presented with abdominal pain found to have a ruptured abdominal aneurysm, admitted with hemorrhagic shock and taken emergency for AAA repair with vascular surgery. Remained intubated post-op due to severe metabolic acidosis.  History of Present Illness:  [name] is a 78 y.o. female who presented to [facility] ED from [facility] walk-in clinic for worsening abdominal pain x 2 days, to which a STAT Abd/Pelvis CT was ordered, resulting with concern for possible ruptured aneurysm. The patient was transported to [facility] ED for further workup. She reported the abdominal pain has been present for 2 weeks but significantly increased over the past two days. Denied shortness of breath and chest pain.  ED Course: Vitals on arrival: Temp 97.8, BP 149/65, HR 74, RR 15, SpO2 95%  Medications Administered: Cefazolin  preoperatively Morphine  NS bolus Zofran  NS gtt  Pertinent Labs/Diagnostics: Initial CBC unremarkable BMP unremarkable Troponin: 20 Lipase 17 11/13 CT abdomen/pelvis: AAA measuring 7cm, as well as a right iliac aneurysm measuring 3.5cm.   Vascular surgery emergently consulted. Upon further evaluation they decided to take her to the vascular lab for AAA repair 11/13. While waiting to go to the OR, she developed severe hypotension (80/69) and tachycardia requiring initiating of norepinephrine, as well as IVF resuscitation. She was emergently taken to the vascular lab, with worsening hypotension requiring phenylephrine , epinephrine  and vasopressin. Massive Transfusion Protocol was initiated and she was mechanically intubated. A total of 6mg  Versed  was given, 3 grams on albumin, calcium chloride 0.25g, 6units  pRBC, 2 units of FFP and 1 unit of platelets. Repeat Hgb was 6.9.   PCCM was consulted for ICU admission due to hypotension requiring vasopressors and mechanical ventilation.  07/28/24: PCCM reconsulted for acute respiratory distress with hypoxemia.  Nurse states that patient was moved from bed to chair and she became acutely dyspneic and hypoxic with SpO2 in the 70s.  She was placed on a nonrebreather but her SpO2 only came up to about 85% and she continued to be dyspneic and also reported mild chest discomfort.  Stat labs and a stat chest x-ray were obtained.  Her ABG on nonrebreather showed a pH of 7.41, pCO2 of 48, pO2 of 52, acid base excess of 5.1 and a bicarb level of 30.4 and an oxygen saturation of 89%.  CBC showed a hemoglobin of 8.1 and a hematocrit of 25.1.  Lactic acid was negative and her troponin was 76.  A chest x-ray showed right lung base infiltrate and pleural effusion.   Pertinent  Medical History  Hypertension Type II Diabetes Mellitus Hyperlipidemia Celiac Artery Stenosis  Significant Hospital Events: Including procedures, antibiotic start and stop dates in addition to other pertinent events   11/13: CT Abd/pelvis identified 7mm AAA concern for rupture, pt decompensated and was emergently taken to the vascular lab. Placed on levo, later requiring vaso, neo and epi during the procedure. MTP initiated, repeat Hgb was 6.9 (previous 13.5). Admitted to the ICU for mechanical ventilation management and hypotension requiring vasopressors. Started on fentanyl  gtt.  11/14: No significant events overnight.  AFebrile, on low dose levophed.  Hgb remained stable overnight, on minimal vent support, WUA & SBT as tolerated.  Diurese with 40 mg + 60 mg  IV Lasix  dose, hopeful for extubation to BiPAP. 11/15: Weaned off Levophed overnight.  Developed tachycardic in afib, rate 140-150s, started on amiodarone gtt. 07/28/24: PCCM reconsulted for acute respiratory distress with hypoxemia; patient  placed on BiPAP 07/29/24: Placed on HFNC from bipap, subsequent O2 requirements increased up to 10L, later placed back on bipap. Troponin 2159, ECHO shows EF 65-70% with moderate RV enlargement; cardio consulted.  Interim History / Subjective:  She is being maintained on continuous BiPAP following event yesterday. Tolerated well, switched her to HFNC this am, with subsequent increasing O2 requirements. Back on Bipap. Troponin 2159. ECHO: EF 65-70% with moderate RV enlargement.  Objective    Blood pressure (!) 115/56, pulse (!) 114, temperature 98.2 F (36.8 C), temperature source Axillary, resp. rate (!) 26, height 5' 7 (1.702 m), weight 107.2 kg, SpO2 97%.    FiO2 (%):  [45 %-80 %] 80 % PEEP:  [5 cmH20] 5 cmH20 Pressure Support:  [5 cmH20] 5 cmH20   Intake/Output Summary (Last 24 hours) at 07/29/2024 1405 Last data filed at 07/29/2024 1336 Gross per 24 hour  Intake 1896.98 ml  Output 2135 ml  Net -238.02 ml   Filed Weights   07/25/24 1210  Weight: 107.2 kg    Examination: General: ill appearing female, laying in bed, awake and oriented, in NAD HEENT: atraumatic, normocephalic, supple, no JVD Lungs: normal effort, equal breath sounds throughout, on bipap, no rhonchi/wheezing/crackls Cardiovascular: Afib with RVR, S1 S2, no m/r/g Abdomen: soft, mild distention and tenderness, no rebound/guarding, bowel sounds x 4 Extremities: warm, dry, intact, radial pulses 2+, distal pulses 1+, trace edema Neuro: alert and oriented x 4, follows commands, no focal neuro deficits, PERRL GU: deferred  Resolved problem list  Mechanical Intubation for Airway Protection Intraoperatively & Metabolic Acidosis Hemorrhagic Shock iso AAA Rupture  Assessment and Plan    #Acute Hypoxic Encephalopathy #ICU Delirium Risk - Continue bipap for hypoxia - Monitor neuro status - ICU delirium protocol  #Hemorrhagic Shock s/t AAA Rupture s/p Stent Placement ~ Resolved #Afib with RVR #Suspected  Pulmonary Edema vs NSTEMI Hx: Hypertension, HLD, Celiac Artery Stenosis 11/17 ECHO: EF 65-70% with moderately enlarged RV - Continuous cardiac monitoring - Vasopressors to maintain MAP goal >65 ~ not currently requiring - Continue Amiodarone gtt - Trend troponins; 2159 today - Per cardio, will increase her lasix  to 40mg  to assess if volume overload is contributing to her hypoxic - BNP pending - EKG prn - Vascular surgery following; appreciate input - Cardiology consult; appreciate input  #Mechanical Intubation ~ Resolved #Acute Hypoxic Respiratory Failure 11/17 Repeat CXR: mildly increased pathcy atelectasis or pneumonia at RLL base; minimally improved LLL atelectasis; stable mild cardiomegaly 11/16 Chest CT: atelectasis vs dense alveolar consolidation of RLL;  pleural-based left apical pulmonary nodule; small right sided pleural effusion; small pericardial effusion - Supplemental oxygen to maintain O2 >92% - Wean FiO2 and PEEP as able - On bipap - Attempted HFNC but subsequently placed back on Bipap - Duonebs prn - Intermittent CXR and ABG - Bilateral LE US  ordered to r/o PE - CTA Chest PE ordered  #Acute Kidney Injury #Metabolic Acidosis s/t Hemorrhagic Shock iso AAA Rupture ~ Resolved #Hypocalcemia - Strict I/O - Trend BMP and monitor renal function - Cr continues to improve - Avoid nephrotoxins as able - Ensure adequate renal perfusion - ICU electrolyte replacement protocol - Pharmacy to assist with replacement  #GI Prophylaxis - Diet: Full liquids - GI ppx: Protonix  - Constipation protocol; will add scheduled Miralax   #Acute  Blood Loss Anemia s/t AAA Rupture - Trend CBC and check coags prn - Transfuse if Hgb <7 - Monitor for s/sx of bleeding - DVT Prophylaxis: SCDs  #Type II Diabetes Mellitus - ICU hypo/hyperglycemia protocol - BG goal range: 140-180 - CBG ac/hs - SSI: Novolog    #HCAP - Trend WBC and monitor fever curve - Blood cultures x 2 - ABX: Zosyn  +  Vanc - Narrow abx pending culture and sensitivities  Best Practice: Diet/type: N.p.o. while on BiPAP VTE prophylaxis:  SCD's /no pharmacologic VTE prophylaxis due to hemorrhage GI prophylaxis: PPI Lines: Peripheral IV Foley: In place and still needed Code Status: Full code   Labs   CBC: Recent Labs  Lab 07/26/24 0415 07/26/24 1527 07/27/24 0333 07/27/24 1204 07/28/24 0321 07/28/24 1459 07/29/24 0531  WBC 13.7*  --  12.6*  --  10.5 10.0 5.2  HGB 8.4* 8.4* 7.7* 8.3* 7.7* 8.1* 7.8*  HCT 24.2* 24.4* 22.6*  --  24.2* 25.1* 24.5*  MCV 81.8  --  83.1  --  86.7 87.2 88.8  PLT 147*  --  130*  --  136* 133* 140*    Basic Metabolic Panel: Recent Labs  Lab 07/27/24 0333 07/27/24 1706 07/28/24 0321 07/28/24 1459 07/29/24 0531  NA 142 142 142 142 142  K 3.6 3.4* 4.0 3.9 3.7  CL 107 107 107 106 106  CO2 24 26 26 25 27   GLUCOSE 144* 128* 128* 150* 140*  BUN 23 21 21 22  25*  CREATININE 1.43* 1.25* 1.21* 1.18* 1.13*  CALCIUM 8.3* 8.4* 8.3* 8.6* 8.6*  MG 2.0 1.8 1.9 1.9 2.2  PHOS 3.7 2.5  --  2.5  --    GFR: Estimated Creatinine Clearance: 50.9 mL/min (A) (by C-G formula based on SCr of 1.13 mg/dL (H)). Recent Labs  Lab 07/25/24 2343 07/26/24 0415 07/27/24 0333 07/28/24 0321 07/28/24 1459 07/29/24 0531  WBC  --    < > 12.6* 10.5 10.0 5.2  LATICACIDVEN 2.1*  --   --   --  1.0  --    < > = values in this interval not displayed.    Liver Function Tests: Recent Labs  Lab 07/25/24 1236 07/27/24 1706 07/29/24 0531  AST 20  --  102*  ALT 14  --  17  ALKPHOS 83  --  54  BILITOT 0.4  --  0.7  PROT 7.2  --  5.7*  ALBUMIN 4.0 3.5 3.4*   Recent Labs  Lab 07/25/24 1236  LIPASE 17   No results for input(s): AMMONIA in the last 168 hours.  ABG    Component Value Date/Time   PHART 7.41 07/28/2024 1419   PCO2ART 48 07/28/2024 1419   PO2ART 52 (L) 07/28/2024 1419   HCO3 30.4 (H) 07/28/2024 1419   ACIDBASEDEF 6.6 (H) 07/25/2024 1807   O2SAT 89 07/28/2024 1419      Coagulation Profile: Recent Labs  Lab 07/25/24 1419 07/25/24 2343  INR 2.2* 1.1    Cardiac Enzymes: No results for input(s): CKTOTAL, CKMB, CKMBINDEX, TROPONINI in the last 168 hours.  HbA1C: Hgb A1c MFr Bld  Date/Time Value Ref Range Status  07/25/2024 12:36 PM 6.5 (H) 4.8 - 5.6 % Final    Comment:    (NOTE) Diagnosis of Diabetes The following HbA1c ranges recommended by the American Diabetes Association (ADA) may be used as an aid in the diagnosis of diabetes mellitus.  Hemoglobin             Suggested A1C NGSP%  Diagnosis  <5.7                   Non Diabetic  5.7-6.4                Pre-Diabetic  >6.4                   Diabetic  <7.0                   Glycemic control for                       adults with diabetes.    08/09/2020 09:19 AM 6.5 (H) 4.8 - 5.6 % Final    Comment:    (NOTE) Pre diabetes:          5.7%-6.4%  Diabetes:              >6.4%  Glycemic control for   <7.0% adults with diabetes     CBG: Recent Labs  Lab 07/28/24 1542 07/28/24 2008 07/29/24 0426 07/29/24 0753 07/29/24 1144  GLUCAP 163* 142* 142* 142* 144*    Review of Systems:   Denies chest pain, HA, dizziness, vision changes, hemoptysis and abdominal pain. Endorses minimal shortness of breath but overall feels good.   Past Medical History:  She,  has a past medical history of Celiac artery stenosis, CME (cystoid macular edema), CNVM (choroidal neovascular membrane), Diabetes mellitus without complication (HCC), Diverticulosis of colon, Elevated lipids, History of cataract, History of rectal bleeding, adenomatous colonic polyps, Hyperlipidemia, Hypertension, Hypertensive retinopathy of both eyes, Lymphedema, and Primary open angle glaucoma (POAG) of both eyes.   Surgical History:   Past Surgical History:  Procedure Laterality Date   ABDOMINAL HYSTERECTOMY     COLONOSCOPY     COLONOSCOPY WITH PROPOFOL  N/A 08/22/2016   Procedure: COLONOSCOPY WITH  PROPOFOL ;  Surgeon: Lamar ONEIDA Holmes, MD;  Location: [facility] ENDOSCOPY;  Service: Endoscopy;  Laterality: N/A;   COLONOSCOPY WITH PROPOFOL  N/A 08/11/2020   Procedure: COLONOSCOPY WITH PROPOFOL ;  Surgeon: Maryruth Ole ONEIDA, MD;  Location: [facility] ENDOSCOPY;  Service: Endoscopy;  Laterality: N/A;   COLONOSCOPY WITH PROPOFOL  N/A 11/12/2021   Procedure: COLONOSCOPY WITH PROPOFOL ;  Surgeon: Maryruth Ole ONEIDA, MD;  Location: [facility] ENDOSCOPY;  Service: Endoscopy;  Laterality: N/A;  DM   COLONOSCOPY WITH PROPOFOL  N/A 08/18/2023   Procedure: COLONOSCOPY WITH PROPOFOL ;  Surgeon: Maryruth Ole ONEIDA, MD;  Location: [facility] ENDOSCOPY;  Service: Endoscopy;  Laterality: N/A;   ENDOVASCULAR STENT GRAFT (AAA) N/A 07/25/2024   Procedure: ENDOVASCULAR STENT GRAFT (AAA);  Surgeon: Jama Rose [name], MD;  Location: [facility] INVASIVE CV LAB;  Service: Cardiovascular;  Laterality: N/A;  ruptured AAA   ENDOVASCULAR STENT GRAFT REPAIR  07/25/2024   Procedure: ENDOVASCULAR STENT GRAFT REPAIR;  Surgeon: Jama Rose [name], MD;  Location: [facility] INVASIVE CV LAB;  Service: Cardiovascular;;   ESOPHAGOGASTRODUODENOSCOPY (EGD) WITH PROPOFOL  N/A 08/11/2020   Procedure: ESOPHAGOGASTRODUODENOSCOPY (EGD) WITH PROPOFOL ;  Surgeon: Maryruth Ole ONEIDA, MD;  Location: [facility] ENDOSCOPY;  Service: Endoscopy;  Laterality: N/A;   EYE SURGERY     JOINT REPLACEMENT Left    left total knee replacement   LOWER EXTREMITY ANGIOGRAPHY Right 02/20/2024   Procedure: Lower Extremity Angiography;  Surgeon: Jama Rose [name], MD;  Location: [facility] INVASIVE CV LAB;  Service: Cardiovascular;  Laterality: Right;   ltkr     needle in foot removed     POLYPECTOMY  08/18/2023   Procedure: POLYPECTOMY;  Surgeon: Maryruth Ole  T, MD;  Location: [facility] ENDOSCOPY;  Service: Endoscopy;;   REFRACTIVE SURGERY       Social History:   reports that she has quit smoking. She has never used smokeless tobacco. She reports that she does not drink alcohol and does not use drugs.    Family History:  Her family history includes Coronary artery disease in her brother; Hyperlipidemia in her mother; Hypertension in her father and mother. There is no history of Breast cancer.   Allergies Allergies  Allergen Reactions   Codeine      Home Medications  Prior to Admission medications   Medication Sig Start Date End Date Taking? Authorizing Provider  amLODipine (NORVASC) 5 MG tablet Take 1 tablet by mouth daily. 10/07/20   [provider]  aspirin  EC 81 MG tablet Take 1 tablet (81 mg total) by mouth daily. Swallow whole. 02/21/24 02/20/25  Schnier, Rose [name], MD  atorvastatin (LIPITOR) 20 MG tablet Take 20 mg by mouth daily.    [provider]  atorvastatin (LIPITOR) 20 MG tablet Take 1 tablet by mouth daily. 05/10/21   [provider]  clopidogrel  (PLAVIX ) 75 MG tablet Take 1 tablet (75 mg total) by mouth daily. 02/21/24   Schnier, Gregory G, MD  metFORMIN (GLUCOPHAGE) 500 MG tablet Take 1 tablet by mouth 2 (two) times daily with a meal. 06/15/20   [provider]  metoprolol  tartrate (LOPRESSOR ) 25 MG tablet Take 0.5 tablets (12.5 mg total) by mouth 2 (two) times daily. Patient not taking: Reported on 06/17/2024 08/13/20   Josette Ade, MD  Multiple Vitamins-Minerals (SENTRY PO) Take by mouth daily.    [provider]  pantoprazole  (PROTONIX ) 40 MG tablet Take 1 tablet (40 mg total) by mouth daily. Patient not taking: Reported on 06/17/2024 08/13/20 01/25/25  Josette Ade, MD  polyethylene glycol (MIRALAX ) 17 g packet Take 17 g by mouth 2 (two) times daily. Patient not taking: Reported on 06/17/2024 08/14/20   Josette Ade, MD  polyethylene glycol powder (GLYCOLAX /MIRALAX ) 17 GM/SCOOP powder Take by mouth. Patient not taking: Reported on 06/17/2024 03/25/21   [provider]  potassium chloride  (KLOR-CON ) 10 MEQ tablet Take 10 mEq by mouth daily. 05/11/20   [provider]  timolol (TIMOPTIC) 0.5 % ophthalmic  solution 1 drop 2 (two) times daily.    [provider]  triamterene-hydrochlorothiazide (MAXZIDE-25) 37.5-25 MG tablet Take 1 tablet by mouth daily. 10/07/23   [provider]   Critical Care Time: 65 minutes   Robet Kim, PA-C [facility] PCCM Team Contact Info: [phone]

## 2024-07-29 NOTE — Evaluation (Signed)
 Occupational Therapy Evaluation Patient Details Name: Rose Perry MRN: [id number] DOB: 11-21-1945 Today's Date: 07/29/2024   History of Present Illness   Pt is a 78 y/o F admitted on 07/25/24 after presenting with c/o worsening abdominal pain. Imaging was concerned for possible ruptured aneurysm. Pt emergently taken to the vascular lab for repair. Pt also being treated for hemorrhagic shock s/t AAA rupture s/p stent placement, a-fib with RVR. PMH: HTN, DM2, HLD, celiac artery stenosis     Clinical Impressions Patient presenting with decreased Ind in self care,balance, functional mobility, transfers, endurance, and safety awareness. Patient reports living at home with family and being Ind in self care tasks with use of SPC for mobility PTA. Pt has multiple family members living on her road. Pt taken off BiPAP today and placed on 10Ls O2 via [place]. She needs cuing for pursed lip breathing this session. She demonstrated 5 reps on incentive spirometer with cues for technique. Pt placed in chair position and demonstrates 5 reps each of ankle pumps, knee extension, hip flexion, and glute squeezes with focus on PLB. O2 saturation did drop to mid 80's and multiple rest breaks needed. Lunch tray arrived and tray set up from pt and she remains in chair position to feed self. Sister present in room is ready to take over and assist in order to increase oral intake.  Patient will benefit from acute OT to increase overall independence in the areas of ADLs, functional mobility, and safety awareness in order to safely discharge.     If plan is discharge home, recommend the following:   A lot of help with walking and/or transfers;A lot of help with bathing/dressing/bathroom;Assistance with cooking/housework;Assist for transportation;Help with stairs or ramp for entrance     Functional Status Assessment   Patient has had a recent decline in their functional status and demonstrates the ability to make  significant improvements in function in a reasonable and predictable amount of time.     Equipment Recommendations   Other (comment) (defer to next venue of care)      Precautions/Restrictions   Precautions Precautions: Fall Precaution/Restrictions Comments: monitor O2, HR Restrictions Weight Bearing Restrictions Per Provider Order: No            ADL either performed or assessed with clinical judgement   ADL Overall ADL's : Needs assistance/impaired Eating/Feeding: Set up;Bed level Eating/Feeding Details (indicate cue type and reason): chair position                                         Vision Patient Visual Report: No change from baseline              Pertinent Vitals/Pain Pain Assessment Pain Assessment: No/denies pain     Extremity/Trunk Assessment Upper Extremity Assessment Upper Extremity Assessment: Generalized weakness   Lower Extremity Assessment Lower Extremity Assessment: Generalized weakness       Communication Communication Communication: No apparent difficulties   Cognition Arousal: Alert Behavior During Therapy: WFL for tasks assessed/performed                                 Following commands: Intact       Cueing  General Comments   Cueing Techniques: Verbal cues              Home Living Family/patient expects to  be discharged to:: Private residence Living Arrangements: Children Available Help at Discharge: Family;Available 24 hours/day Type of Home: House Home Access: Stairs to enter [employer] of Steps: 2-3 Entrance Stairs-Rails: None Home Layout: One level     Bathroom Shower/Tub: Chief Strategy Officer: Standard     Home Equipment: Cane - single Librarian, Academic (2 wheels)          Prior Functioning/Environment Prior Level of Function : Independent/Modified Independent             Mobility Comments: Ambulatory with SPC, driving, denies  falls. ADLs Comments: Independent    OT Problem List: Decreased strength;Decreased safety awareness;Decreased activity tolerance;Impaired balance (sitting and/or standing);Cardiopulmonary status limiting activity   OT Treatment/Interventions: Self-care/ADL training;Therapeutic activities;Therapeutic exercise;Energy conservation;Patient/family education;Balance training      OT Goals(Current goals can be found in the care plan section)   Acute Rehab OT Goals Patient Stated Goal: to get stronger OT Goal Formulation: With patient/family Time For Goal Achievement: 08/12/24 Potential to Achieve Goals: Fair ADL Goals Pt Will Perform Grooming: with supervision;sitting Pt Will Perform Lower Body Dressing: with supervision;sit to/from stand Pt Will Transfer to Toilet: with supervision;ambulating Pt Will Perform Toileting - Clothing Manipulation and hygiene: with supervision;sit to/from stand   OT Frequency:  Min 2X/week       AM-PAC OT 6 Clicks Daily Activity     Outcome Measure Help from another person eating meals?: A Little Help from another person taking care of personal grooming?: A Little Help from another person toileting, which includes using toliet, bedpan, or urinal?: A Little Help from another person bathing (including washing, rinsing, drying)?: A Little Help from another person to put on and taking off regular upper body clothing?: A Little Help from another person to put on and taking off regular lower body clothing?: A Little 6 Click Score: 18   End of Session Nurse Communication: Mobility status  Activity Tolerance: Patient limited by fatigue Patient left: in bed;with call bell/phone within reach;with bed alarm set;with family/visitor present  OT Visit Diagnosis: Unsteadiness on feet (R26.81);Repeated falls (R29.6);Muscle weakness (generalized) (M62.81)                Time: 8794-8778 OT Time Calculation (min): 16 min Charges:  OT General Charges $OT Visit: 1  Visit OT Evaluation $OT Eval Moderate Complexity: 1 Mod OT Treatments $Self Care/Home Management : 8-22 mins [name], MS, OTR/L , CBIS ascom [phone]  07/29/24, 1:16 PM

## 2024-07-29 NOTE — Progress Notes (Signed)
 Pt IV infiltrated while receiving Amiodarone. Pharmacy contacted and instructed this nurse to consult with Provider to order antidote hyaluronidase.  Bobetta Fell, RN

## 2024-07-29 NOTE — Progress Notes (Signed)
 PHARMACY CONSULT NOTE - FOLLOW UP  Pharmacy Consult for Electrolyte Monitoring and Replacement   Recent Labs: Potassium (mmol/L)  Date Value  07/29/2024 3.7  08/23/2013 3.4 (L)   Magnesium (mg/dL)  Date Value  88/82/7974 2.2   Calcium (mg/dL)  Date Value  88/82/7974 8.6 (L)   Calcium, Total (mg/dL)  Date Value  87/87/7985 8.2 (L)   Albumin (g/dL)  Date Value  88/82/7974 3.4 (L)   Phosphorus (mg/dL)  Date Value  88/83/7974 2.5   Sodium (mmol/L)  Date Value  07/29/2024 142  08/23/2013 137     Assessment: 78 year old female with history of PAD, T2DM, and HTN who presented with abdominal pain found to have a ruptured abdominal aneurysm, admitted with hemorrhagic shock and taken emergency for AAA repair with vascular surgery. Pharmacy is asked to follow and replace electrolytes while in CCU  Diuretics: furosemide  20 mg IV once daily  Goal of Therapy:  Potassium 4.0 - 5.1 mmol/L Magnesium 2.0 - 2.4 mg/dL All Other Electrolytes WNL  Plan:  ---10 mEq IV KCl x 2 ---recheck electrolytes in am  Adriana JONETTA Bolster ,PharmD Clinical Pharmacist 07/29/2024 1:01 PM

## 2024-07-29 NOTE — Progress Notes (Signed)
*  PRELIMINARY RESULTS* Echocardiogram 2D Echocardiogram has been performed.  Floydene Harder 07/29/2024, 11:30 AM

## 2024-07-29 NOTE — Op Note (Signed)
 [facility]  Percutaneous Study/Intervention Procedural Note   Date of Surgery: 07/29/2024,7:54 PM  Surgeon:Camerin Ladouceur, Rose [name]   Pre-operative Diagnosis: Symptomatic pulmonary emboli with right heart strain and hypoxia  Post-operative diagnosis:  Same  Procedure(s) Performed:  1.  Contrast injection right heart and bilateral pulmonary arteries  2.  Placement of a right IJ triple-lumen catheter with ultrasound guidance  3.  Mechanical thrombectomy bilateral lobar pulmonary arteries for removal of pulmonary emboli using the Penumbra CAT 8 thrombectomy catheter.  4.  Selective catheter placement right upper lobe pulmonary artery, middle lobe pulmonary artery and lower lobe pulmonary artery  5.  Selective catheter placement left upper lobe pulmonary artery and lower lobe pulmonary artery.             6.  Placement of a Denali infrarenal IVC filter    Anesthesia: Conscious sedation was administered under my direct supervision by the interventional radiology RN. IV Versed  plus fentanyl  were utilized. Continuous ECG, pulse oximetry and blood pressure was monitored throughout the entire procedure.  Versed  and fentanyl  were administered intravenously.  Conscious sedation was administered for a total of 52 minutes.  Sheath: 9 French 11 cm Pinnacle  Contrast: 65 cc   Fluoroscopy Time: 20.7 minutes  Indications:  Patient presents with pulmonary emboli. The patient is symptomatic with hypoxemia and dyspnea on exertion.  There is evidence of right heart strain on the CT angiogram. The patient is otherwise a good candidate for intervention and even the long-term benefits pulmonary angiography with thrombolysis is offered. The risks and benefits are reviewed long-term benefits are discussed. All questions are answered patient agrees to proceed.  Procedure:  Rose Perry a 78 y.o. female who was identified and appropriate procedural time out was performed.  The patient was  then placed supine on the table and prepped and draped in the usual sterile fashion initially for the central line placement and then for the pulmonary thrombectomy.    The ultrasound was placed in a sterile sleeve.  The right internal jugular vein was echolucent and easily compressible indicating patency.  Images recorded for the permanent record.  Lidocaine  was then infiltrated in the soft tissues and the skin.  Microneedle was then inserted into the jugular vein under direct ultrasound visualization.  Microwire followed by micro sheath J-wire followed by the dilator and then a triple-lumen catheter.  All 3 lm aspirated and flushed easily.  Catheter was secured to the skin of the neck with 2-0 silk suture and a sterile dressing was applied.  Attention was then turned to the pulmonary thrombectomy.  Ultrasound was used to evaluate the right common femoral vein.  It was patent, as it was echolucent and compressible.  A digital ultrasound image was acquired for the permanent record.  A micropuncture needle was used to access the right common femoral vein under direct ultrasound guidance.  A microwire was then advanced under fluoroscopic guidance followed by micro-sheath.  A 0.035 J wire was advanced without resistance and a 5Fr sheath was placed.  Perclose devices were then used in a preclose fashion and then upsized to an 9 French sheath.    The wire and pigtail catheter were then negotiated into the right atrium and bolus injection of contrast was utilized to demonstrate the right ventricle and the pulmonary artery outflow.   6000 units of heparin  was then given and allowed to circulate.  The J-wire and pigtail catheter was advanced up to the right atrium where a bolus injection  contrast was used to demonstrate the pulmonary artery outflow.  Stiff angled Glidewire was then exchanged for the J-wire and the pigtail catheter was used to select the pulmonary outflow track.  The right main pulmonary artery  was evaluated first.  A select catheter was then advanced over the Amplatz wire into the distal right main pulmonary artery and hand-injection confirmed the thrombus.  The Amplatz wire was reintroduced through the select catheter and the select catheter removed.  The Penumbra Cat 8 extra torque catheter was then advanced into the thrombus in the right lower lobe pulmonary artery.  Hand-injection contrast was used to verify positioning and evaluate the distal anatomy.  Mechanical aspiration was performed using the CAT 8 catheter and a separator.  After multiple passes the catheter was then repositioned into the right middle lobe pulmonary artery and again hand-injection contrast was performed to verify position and evaluate the distal anatomy.  Multiple passes were made using mechanical aspiration in association with a separator.  Lastly, using a combination of the separator catheter and Glidewire the CAT 8 device was negotiated into the right upper lobe pulmonary artery.  Hand-injection of contrast was used to verify positioning and evaluate the distal anatomy.  Multiple passes were then performed using the separator with the CAT 8 penumbra catheter.  Once there was free flow of blood from all 3 lobar arteries the catheter was repositioned to the right main pulmonary artery.  Hand-injection contrast was then used to give an assessment of the effectiveness of thrombectomy.  Satisfied with the thrombectomy on the right, I then used the penumbra CAT 8 device as well as a Glidewire and an angled catheter to select the left main pulmonary artery.  The catheter was then advanced into the left main pulmonary artery.  Then with the catheter in the left main pulmonary artery bolus injection contrast was utilized to demonstrate the thrombus as well as the segmental pulmonary artery vasculature. This demonstrated thrombus in the distal left main pulmonary artery extending into the left upper lobe artery as well as the left  lower lobe artery and noting that it was near occlusive.    The Amplatz wire was introduced through the pigtail catheter and the pigtail catheter removed.  The Penumbra Cat 8 extra torque catheter was then advanced into the thrombus in the left lower lobe pulmonary artery.  Hand-injection contrast was used to verify the positioning and evaluate the distal anatomy.  Mechanical aspiration was performed using the CAT 8 catheter and a separator.  After multiple passes the catheter was then repositioned into the left upper lobe pulmonary artery and again hand-injection contrast was performed to verify positioning and evaluate the distal anatomy.  Multiple passes were made using mechanical aspiration in association with a separator.  Once there was free flow of blood from both lobar arteries the catheter was repositioned to the left main pulmonary artery.  Hand-injection contrast was then performed to give an assessment of the left pulmonary vasculature and the effectiveness of thrombectomy.  After review these images the catheter was removed.  Hand-injection of contrast through the 9 French sheath in the right common femoral vein was then performed to image the inferior vena cava.  It was noted to be widely patent with the renal blushes at the L2 level.  The 9 French sheath was then exchanged for the IVC filter delivery sheath.  A Denali filter was then advanced to L3 and deployed without difficulty and excellent orientation.  The sheath was then removed  over the J-wire and the Perclose device was secured.  J-wire was then removed and pressure held.  Safeguard was then applied there were no immediate complications.    Findings:   Right heart imaging:  Right atrium and right ventricle and the pulmonary outflow tract appears normal  Right lung: The initial images of the right lung demonstrate thrombus within the distal right main pulmonary artery extending into the right upper, middle and lower lobar arteries.   There is thrombus extending into the segmental branches as well.  Following thrombectomy there appears to be near total resolution of the previously identified thrombus.  Left lung:  The initial images of the left lung demonstrate thrombus within the distal left main pulmonary artery extending into the left upper and lower lobar arteries.  There is thrombus extending into the segmental branches as well.  Following thrombectomy there appears to be near total resolution of the previously identified thrombus.               IVC: The inferior vena cava is widely patent.  It measures approximately 18 mm in diameter.  Denali filter is deployed at the L3 level    Disposition: Patient was taken to the recovery room in stable condition having tolerated the procedure well.  Rose Perry 07/29/2024,7:54 PM

## 2024-07-29 NOTE — Plan of Care (Addendum)
 Consulted PCCM on 11/16 due to hypoxia and requiring BiPAP Patient was able to be weaned off BiPAP to 10 L HFNC, later continued to desaturate and was placed back on BiPAP 80% FiO2 Discussed with PCCM, will sign off as patient not stable for downgrade

## 2024-07-29 NOTE — Progress Notes (Addendum)
 PHARMACY - ANTICOAGULATION CONSULT NOTE  Pharmacy Consult for Heparin  Infusion Indication: pulmonary embolus  Allergies  Allergen Reactions   Codeine     Patient Measurements: Height: 5' 7 (170.2 cm) Weight: 107.2 kg (236 lb 5.3 oz) IBW/kg (Calculated) : 61.6 HEPARIN  DW (KG): 86.1  Vital Signs: Temp: 98.2 F (36.8 C) (11/17 1142) Temp Source: Axillary (11/17 1142) BP: 122/64 (11/17 1545) Pulse Rate: 109 (11/17 1545)  Labs: Recent Labs    07/28/24 0321 07/28/24 1459 07/29/24 0531  HGB 7.7* 8.1* 7.8*  HCT 24.2* 25.1* 24.5*  PLT 136* 133* 140*  CREATININE 1.21* 1.18* 1.13*    Estimated Creatinine Clearance: 50.9 mL/min (A) (by C-G formula based on SCr of 1.13 mg/dL (H)).   Medical History: Past Medical History:  Diagnosis Date   Celiac artery stenosis    CME (cystoid macular edema)    right   CNVM (choroidal neovascular membrane)    Diabetes mellitus without complication (HCC)    Diverticulosis of colon    Elevated lipids    History of cataract    History of rectal bleeding    Hx of adenomatous colonic polyps    Hyperlipidemia    Hypertension    Hypertensive retinopathy of both eyes    Lymphedema    Primary open angle glaucoma (POAG) of both eyes     Medications:  Not on anticoagulation previously  Assessment: Patient is a 78 year old female with a past medical history of peripheral vascular disease, hypertension, type II diabetes who presented to the ED on 07/25/2024 for abdominal pain, found to have ruptured aortic aneurysm that was repaired on 11/13. Patient taken for CTA today and was found to have a saddle pulmonary embolism extending into segmental branches of all lobes bilaterally. Pharmacy was consulted to initiate patient on a heparin  infusion.  Baseline labs: INR and aPTT ordered Hgb 7.8 and PLT 140 this morning- low but stable No signs/symptoms of bleeding noted in chart  Goal of Therapy:  Heparin  level 0.3-0.7 units/ml Monitor platelets  by anticoagulation protocol: Yes   Plan:  Per vascular surgeon start heparin  11/17 @ 2030 with no bolus Start heparin  infusion at a rate of 1350 units/hr Check heparin  level in 8 hours Monitor CBC daily while on heparin    Lum VEAR Mania, PharmD, BCPS 07/29/2024,4:22 PM

## 2024-07-29 NOTE — Progress Notes (Signed)
 Progress Note    07/29/2024 8:15 AM 4 Days Post-Op  Subjective:  Rose Perry is a 78 yo female who is now POD #4 from:   PROCEDURE: US  guidance for vascular access, bilateral femoral arteries Catheter placement into aorta from bilateral femoral approaches Catheter placement into the right internal iliac artery from the left common femoral approach third order catheter placement Coil embolization of the right internal iliac artery for treatment of the right common iliac artery aneurysm. Placement of a 28 x 14 x 12 C3 conformable Gore Excluder Endoprosthesis main body with a 16 x 10 left iliac extension limb with a 12 x 14 contralateral limb that extends into the right external iliac artery for treatment of the common iliac artery aneurysm Placement of a 28 mm proximal aortic extension cuff Selective catheterization of the left renal artery first-order catheter placement. Placement of a 7 mm x 26 mm Lifestream stent left renal artery ProGlide closure devices bilateral femoral arteries   PRE-OPERATIVE DIAGNOSIS: Ruptured juxtarenal AAA with hemodynamic shock   POST-OPERATIVE DIAGNOSIS: same   SURGEON: Cordella Shawl, MD and Selinda Gu, MD - Co-surgeons   ANESTHESIA: general     Patient is resting comfortably in ICU this morning.  She remains on BIPAP and 10 liters [place] Oxygen. She endorses that she feels better this morning.  She continues to have labored breathing despite bipap. No complications overnight.  Patient remains on Amiodarone infusion for Atrial Fibrillation.     Vitals:   07/29/24 0400 07/29/24 0500  BP: 109/65 120/62  Pulse: 100 100  Resp: 18 18  Temp:    SpO2: 97% 97%   Physical Exam: Cardiac: Atrial Fibrillation with tachycardia, normal S1 and S2.  No murmurs appreciated. Lungs: Nonlabored breathing on the ventilator.  No rales rhonchi or wheezing noted. Incisions: Bilateral groin puncture sites.  Dressings clean dry and intact and without any hematoma seroma  no. Extremities: All extremities are warm to touch.  Positive palpable pulses in bilateral lower extremities. Abdomen: Positive bowel sounds throughout, patient's mid abdomen remains tender upon palpation but this is expected.  Nondistended this morning. Neurologic: Alert and oriented X 3. Answers all questions and follows commands.   CBC    Component Value Date/Time   WBC 5.2 07/29/2024 0531   RBC 2.76 (L) 07/29/2024 0531   HGB 7.8 (L) 07/29/2024 0531   HGB 11.9 (L) 08/22/2013 0455   HCT 24.5 (L) 07/29/2024 0531   HCT 43.8 08/07/2013 0925   PLT 140 (L) 07/29/2024 0531   PLT 217 08/22/2013 0455   MCV 88.8 07/29/2024 0531   MCV 78 (L) 08/07/2013 0925   MCH 28.3 07/29/2024 0531   MCHC 31.8 07/29/2024 0531   RDW 16.7 (H) 07/29/2024 0531   RDW 14.4 08/07/2013 0925   LYMPHSABS 2.0 08/13/2020 0527   MONOABS 0.5 08/13/2020 0527   EOSABS 0.4 08/13/2020 0527   BASOSABS 0.0 08/13/2020 0527    BMET    Component Value Date/Time   NA 142 07/29/2024 0531   NA 137 08/23/2013 0519   K 3.7 07/29/2024 0531   K 3.4 (L) 08/23/2013 0519   CL 106 07/29/2024 0531   CL 103 08/23/2013 0519   CO2 27 07/29/2024 0531   CO2 30 08/23/2013 0519   GLUCOSE 140 (H) 07/29/2024 0531   GLUCOSE 168 (H) 08/23/2013 0519   BUN 25 (H) 07/29/2024 0531   BUN 12 08/23/2013 0519   CREATININE 1.13 (H) 07/29/2024 0531   CREATININE 1.17 08/23/2013 0519   CALCIUM  8.6 (L) 07/29/2024 0531   CALCIUM 8.2 (L) 08/23/2013 0519   GFRNONAA 49 (L) 07/29/2024 0531   GFRNONAA 48 (L) 08/23/2013 0519   GFRAA 55 (L) 08/23/2013 0519    INR    Component Value Date/Time   INR 1.1 07/25/2024 2343   INR 1.0 08/07/2013 0925     Intake/Output Summary (Last 24 hours) at 07/29/2024 0815 Last data filed at 07/29/2024 0600 Gross per 24 hour  Intake 698.28 ml  Output 1680 ml  Net -981.72 ml     Assessment/Plan:  78 y.o. female is s/p SEE ABOVE 4 Days Post-Op   PLAN Appreciate the ICU team's and Hospitalist management and  care of the patient.  Chest Xray ordered this morning. Will follow up.  Amiodarone infusion running. Plan to switch to oral Amiodarone  Pain medication provided as needed. Continue to monitor vital signs with bilateral lower extremity pulses. Continue bedrest for today. Recommend continue to work with PT/OT. Incentive spirometry q 1 hr while away.    DVT prophylaxis: None at this time due to AAA rupture.   [name] 07/29/2024 8:15 AM

## 2024-07-29 NOTE — Progress Notes (Signed)
 Pt transported to and from CT scan with RT without adverse event.

## 2024-07-29 NOTE — Consult Note (Signed)
 [facility] CLINIC CARDIOLOGY CONSULT NOTE       Patient ID: DELCIE RUPPERT MRN: [id number] DOB/AGE: 79/23/1947 79 y.o.  Admit date: 07/25/2024 Referring Physician Robet Kim, PA - PCCM Primary Physician Marikay Eva POUR, PA  Primary Cardiologist None Reason for Consultation Decompensated respiratory status, elevated troponin  HPI: JINX GILDEN is a 78 y.o. female  with a past medical history of peripheral vascular disease, hypertension, type II diabetes who presented to the ED on 07/25/2024 for abdominal pain, found to have ruptured aortic aneurysm. Underwent repair on 07/25/2024. Over the weekend into this AM she developed worsening respiratory status. Troponin noted to be elevated. Cardiology was consulted for further evaluation.   Patient initially brought in with significant abdominal pain, found to have ruptured AAA and underwent repair on 11/13.  Initially was doing well with recovery aside from some atrial fibrillation RVR for which she was treated with amiodarone.  However yesterday she had decompensated respiratory status requiring BiPAP which she remains on today.  Notable lab work today includes creatinine 1.13, potassium 3.7, hemoglobin 7.8, WBC 5.2. Troponins 770 > 2159, BNP ordered. Tele today with sinus tach PACs rate 100s. Currently on IV amiodarone, IV zosyn .   At the time of my evaluation this afternoon, patient is resting in bed on BiPAP. Spoke with patient's sister at bedside. She states that patient had been doing ok until yesterday when she was having more issues breathing. States patient did not complain of CP, palpitations.   Review of systems complete and found to be negative unless listed above    Past Medical History:  Diagnosis Date   Celiac artery stenosis    CME (cystoid macular edema)    right   CNVM (choroidal neovascular membrane)    Diabetes mellitus without complication (HCC)    Diverticulosis of colon    Elevated lipids    History of  cataract    History of rectal bleeding    Hx of adenomatous colonic polyps    Hyperlipidemia    Hypertension    Hypertensive retinopathy of both eyes    Lymphedema    Primary open angle glaucoma (POAG) of both eyes     Past Surgical History:  Procedure Laterality Date   ABDOMINAL HYSTERECTOMY     COLONOSCOPY     COLONOSCOPY WITH PROPOFOL  N/A 08/22/2016   Procedure: COLONOSCOPY WITH PROPOFOL ;  Surgeon: Lamar ONEIDA Holmes, MD;  Location: [facility] ENDOSCOPY;  Service: Endoscopy;  Laterality: N/A;   COLONOSCOPY WITH PROPOFOL  N/A 08/11/2020   Procedure: COLONOSCOPY WITH PROPOFOL ;  Surgeon: Maryruth Ole ONEIDA, MD;  Location: [facility] ENDOSCOPY;  Service: Endoscopy;  Laterality: N/A;   COLONOSCOPY WITH PROPOFOL  N/A 11/12/2021   Procedure: COLONOSCOPY WITH PROPOFOL ;  Surgeon: Maryruth Ole ONEIDA, MD;  Location: [facility] ENDOSCOPY;  Service: Endoscopy;  Laterality: N/A;  DM   COLONOSCOPY WITH PROPOFOL  N/A 08/18/2023   Procedure: COLONOSCOPY WITH PROPOFOL ;  Surgeon: Maryruth Ole ONEIDA, MD;  Location: [facility] ENDOSCOPY;  Service: Endoscopy;  Laterality: N/A;   ENDOVASCULAR STENT GRAFT (AAA) N/A 07/25/2024   Procedure: ENDOVASCULAR STENT GRAFT (AAA);  Surgeon: Jama Cordella [name], MD;  Location: [facility] INVASIVE CV LAB;  Service: Cardiovascular;  Laterality: N/A;  ruptured AAA   ENDOVASCULAR STENT GRAFT REPAIR  07/25/2024   Procedure: ENDOVASCULAR STENT GRAFT REPAIR;  Surgeon: Jama Cordella [name], MD;  Location: [facility] INVASIVE CV LAB;  Service: Cardiovascular;;   ESOPHAGOGASTRODUODENOSCOPY (EGD) WITH PROPOFOL  N/A 08/11/2020   Procedure: ESOPHAGOGASTRODUODENOSCOPY (EGD) WITH PROPOFOL ;  Surgeon: Maryruth Ole ONEIDA, MD;  Location:  [facility] ENDOSCOPY;  Service: Endoscopy;  Laterality: N/A;   EYE SURGERY     JOINT REPLACEMENT Left    left total knee replacement   LOWER EXTREMITY ANGIOGRAPHY Right 02/20/2024   Procedure: Lower Extremity Angiography;  Surgeon: Jama Cordella [name], MD;  Location: [facility] INVASIVE CV LAB;  Service:  Cardiovascular;  Laterality: Right;   ltkr     needle in foot removed     POLYPECTOMY  08/18/2023   Procedure: POLYPECTOMY;  Surgeon: Maryruth Ole DASEN, MD;  Location: [facility] ENDOSCOPY;  Service: Endoscopy;;   REFRACTIVE SURGERY      Medications Prior to Admission  Medication Sig Dispense Refill Last Dose/Taking   amLODipine (NORVASC) 5 MG tablet Take 1 tablet by mouth daily.   07/24/2024   aspirin  EC 81 MG tablet Take 1 tablet (81 mg total) by mouth daily. Swallow whole. 150 tablet 1 07/24/2024   atorvastatin (LIPITOR) 20 MG tablet Take 1 tablet by mouth daily.   07/24/2024   clopidogrel  (PLAVIX ) 75 MG tablet Take 1 tablet (75 mg total) by mouth daily. 30 tablet 4 07/24/2024 Morning   metFORMIN (GLUCOPHAGE) 500 MG tablet Take 1 tablet by mouth 2 (two) times daily with a meal.   07/24/2024   metoprolol  tartrate (LOPRESSOR ) 25 MG tablet Take 0.5 tablets (12.5 mg total) by mouth 2 (two) times daily. 30 tablet 0 07/24/2024   pantoprazole  (PROTONIX ) 40 MG tablet Take 1 tablet (40 mg total) by mouth daily. 30 tablet 0 07/24/2024   polyethylene glycol (MIRALAX ) 17 g packet Take 17 g by mouth 2 (two) times daily. 60 each 0 Taking   potassium chloride  (KLOR-CON ) 10 MEQ tablet Take 10 mEq by mouth daily.   07/24/2024   timolol (TIMOPTIC) 0.5 % ophthalmic solution Place 1 drop into both eyes 2 (two) times daily.   07/24/2024   triamterene-hydrochlorothiazide (MAXZIDE-25) 37.5-25 MG tablet Take 1 tablet by mouth daily.   07/24/2024   dorzolamide-timolol (COSOPT) 2-0.5 % ophthalmic solution Place 1 drop into both eyes 2 (two) times daily.   Unknown   Multiple Vitamins-Minerals (SENTRY PO) Take by mouth daily.   Unknown   Social History   Socioeconomic History   Marital status: Divorced    Spouse name: Not on file   Number of children: Not on file   Years of education: Not on file   Highest education level: Not on file  Occupational History   Not on file  Tobacco Use   Smoking status: Former    Smokeless tobacco: Never  Vaping Use   Vaping status: Never Used  Substance and Sexual Activity   Alcohol use: No   Drug use: No   Sexual activity: Not Currently  Other Topics Concern   Not on file  Social History Narrative   There are several medical conditions patient states she has never had. This RN was deleting some when the patient asked me to stop so she could have list. The ones I deleted are AAA, arthritis, and anemia.   Social Drivers of Corporate Investment Banker Strain: Low Risk  (01/18/2023)   Received from [facility]   Overall Financial Resource Strain (CARDIA)    Difficulty of Paying Living Expenses: Not hard at all  Food Insecurity: Patient Unable To Answer (07/25/2024)   Hunger Vital Sign    Worried About Running Out of Food in the Last Year: Patient unable to answer    Ran Out of Food in the Last Year: Patient unable to answer  Transportation Needs: Patient Unable To Answer (07/25/2024)   PRAPARE - Transportation    Lack of Transportation (Medical): Patient unable to answer    Lack of Transportation (Non-Medical): Patient unable to answer  Physical Activity: Not on file  Stress: Not on file  Social Connections: Patient Unable To Answer (07/25/2024)   Social Connection and Isolation Panel    Frequency of Communication with Friends and Family: Patient unable to answer    Frequency of Social Gatherings with Friends and Family: Patient unable to answer    Attends Religious Services: Patient unable to answer    Active Member of Clubs or Organizations: Patient unable to answer    Attends Banker Meetings: Patient unable to answer    Marital Status: Patient unable to answer  Intimate Partner Violence: Patient Unable To Answer (07/25/2024)   Humiliation, Afraid, Rape, and Kick questionnaire    Fear of Current or Ex-Partner: Patient unable to answer    Emotionally Abused: Patient unable to answer    Physically Abused: Patient unable to  answer    Sexually Abused: Patient unable to answer    Family History  Problem Relation Age of Onset   Hypertension Mother    Hyperlipidemia Mother    Hypertension Father    Coronary artery disease Brother    Breast cancer Neg Hx      Vitals:   07/29/24 1245 07/29/24 1300 07/29/24 1315 07/29/24 1330  BP: (!) 144/62 116/61 125/66 (!) 115/56  Pulse: (!) 118 (!) 114 (!) 114 (!) 114  Resp: (!) 28 (!) 27 (!) 24 (!) 26  Temp:      TempSrc:      SpO2: (!) 84% 94% 96% 97%  Weight:      Height:        PHYSICAL EXAM General: Ill appearing female, well nourished, in no acute distress. HEENT: Normocephalic and atraumatic. Neck: No JVD.  Lungs: Normal respiratory effort on BiPAP.  Heart: HRRR, borderline rate. Normal S1 and S2 without gallops or murmurs.  Abdomen: Non-distended appearing.  Msk: Normal strength and tone for age. Extremities: Warm and well perfused. No clubbing, cyanosis. No edema.  Neuro: Alert and oriented X 3. Psych: Answers questions appropriately.   Labs: Basic Metabolic Panel: Recent Labs    07/27/24 1706 07/28/24 0321 07/28/24 1459 07/29/24 0531  NA 142   < > 142 142  K 3.4*   < > 3.9 3.7  CL 107   < > 106 106  CO2 26   < > 25 27  GLUCOSE 128*   < > 150* 140*  BUN 21   < > 22 25*  CREATININE 1.25*   < > 1.18* 1.13*  CALCIUM 8.4*   < > 8.6* 8.6*  MG 1.8   < > 1.9 2.2  PHOS 2.5  --  2.5  --    < > = values in this interval not displayed.   Liver Function Tests: Recent Labs    07/27/24 1706 07/29/24 0531  AST  --  102*  ALT  --  17  ALKPHOS  --  54  BILITOT  --  0.7  PROT  --  5.7*  ALBUMIN 3.5 3.4*   No results for input(s): LIPASE, AMYLASE in the last 72 hours. CBC: Recent Labs    07/28/24 1459 07/29/24 0531  WBC 10.0 5.2  HGB 8.1* 7.8*  HCT 25.1* 24.5*  MCV 87.2 88.8  PLT 133* 140*   Cardiac Enzymes: No results for input(s): CKTOTAL,  CKMB, CKMBINDEX, TROPONINIHS in the last 72 hours. BNP: No results for input(s):  BNP in the last 72 hours. D-Dimer: No results for input(s): DDIMER in the last 72 hours. Hemoglobin A1C: No results for input(s): HGBA1C in the last 72 hours. Fasting Lipid Panel: No results for input(s): CHOL, HDL, LDLCALC, TRIG, CHOLHDL, LDLDIRECT in the last 72 hours. Thyroid Function Tests: No results for input(s): TSH, T4TOTAL, T3FREE, THYROIDAB in the last 72 hours.  Invalid input(s): FREET3 Anemia Panel: No results for input(s): VITAMINB12, FOLATE, FERRITIN, TIBC, IRON , RETICCTPCT in the last 72 hours.   Radiology: ECHOCARDIOGRAM COMPLETE Result Date: 07/29/2024    ECHOCARDIOGRAM REPORT   Patient Name:   YOSELINE ANDERSSON Date of Exam: 07/29/2024 Medical Rec #:  [id number]      Height:       67.0 in Accession #:    [id number]     Weight:       236.3 lb Date of Birth:  08/04/1946     BSA:          2.171 m Patient Age:    78 years       BP:           116/59 mmHg Patient Gender: F              HR:           103 bpm. Exam Location:  [facility] Procedure: 2D Echo, Cardiac Doppler and Color Doppler (Both Spectral and Color            Flow Doppler were utilized during procedure). Indications:     Dyspnea R06.00  History:         Patient has no prior history of Echocardiogram examinations.                  Risk Factors:Diabetes and Hypertension.  Sonographer:     Christopher Furnace Referring Phys:  [id number] MAGDALENE S TUKOV-YUAL Diagnosing Phys: Dwayne D Callwood MD IMPRESSIONS  1. Left ventricular ejection fraction, by estimation, is 65 to 70%. The left ventricle has normal function. The left ventricle has no regional wall motion abnormalities. There is moderate asymmetric left ventricular hypertrophy. Left ventricular diastolic parameters are consistent with Grade I diastolic dysfunction (impaired relaxation).  2. Right ventricular systolic function is mildly reduced. The right ventricular size is moderately enlarged.  3. Left atrial size was mildly dilated.  4. Right atrial  size was mildly dilated.  5. The mitral valve is normal in structure. Trivial mitral valve regurgitation.  6. The aortic valve is normal in structure. Aortic valve regurgitation is trivial. Aortic valve sclerosis/calcification is present, without any evidence of aortic stenosis. FINDINGS  Left Ventricle: Left ventricular ejection fraction, by estimation, is 65 to 70%. The left ventricle has normal function. The left ventricle has no regional wall motion abnormalities. Strain was performed and the global longitudinal strain is indeterminate. The left ventricular internal cavity size was normal in size. There is moderate asymmetric left ventricular hypertrophy. Left ventricular diastolic parameters are consistent with Grade I diastolic dysfunction (impaired relaxation). Right Ventricle: The right ventricular size is moderately enlarged. No increase in right ventricular wall thickness. Right ventricular systolic function is mildly reduced. Left Atrium: Left atrial size was mildly dilated. Right Atrium: Right atrial size was mildly dilated. Pericardium: There is no evidence of pericardial effusion. Mitral Valve: The mitral valve is normal in structure. Trivial mitral valve regurgitation. MV peak gradient, 5.3 mmHg. The mean mitral valve gradient is 2.0 mmHg. Tricuspid  Valve: The tricuspid valve is normal in structure. Tricuspid valve regurgitation is trivial. Aortic Valve: The aortic valve is normal in structure. Aortic valve regurgitation is trivial. Aortic valve sclerosis/calcification is present, without any evidence of aortic stenosis. Aortic valve mean gradient measures 4.0 mmHg. Aortic valve peak gradient measures 8.3 mmHg. Aortic valve area, by VTI measures 2.62 cm. Pulmonic Valve: The pulmonic valve was normal in structure. Pulmonic valve regurgitation is not visualized. Aorta: The ascending aorta was not well visualized. IAS/Shunts: No atrial level shunt detected by color flow Doppler. Additional Comments: 3D  was performed not requiring image post processing on an independent workstation and was indeterminate.  LEFT VENTRICLE PLAX 2D LVIDd:         4.70 cm   Diastology LVIDs:         2.70 cm   LV e' medial:    5.33 cm/s LV PW:         1.00 cm   LV E/e' medial:  10.3 LV IVS:        1.40 cm   LV e' lateral:   7.07 cm/s LVOT diam:     2.10 cm   LV E/e' lateral: 7.8 LV SV:         48 LV SV Index:   22 LVOT Area:     3.46 cm LV IVRT:       77 msec  RIGHT VENTRICLE RV Basal diam:  4.20 cm     PULMONARY VEINS RV Mid diam:    3.60 cm     Diastolic Velocity: 17.00 cm/s RV S prime:     13.40 cm/s  S/D Velocity:       2.60 TAPSE (M-mode): 1.8 cm      Systolic Velocity:  43.40 cm/s LEFT ATRIUM           Index        RIGHT ATRIUM           Index LA diam:      4.00 cm 1.84 cm/m   RA Area:     16.80 cm LA Vol (A2C): 31.4 ml 14.46 ml/m  RA Volume:   45.30 ml  20.87 ml/m LA Vol (A4C): 88.4 ml 40.72 ml/m  AORTIC VALVE AV Area (Vmax):    2.50 cm AV Area (Vmean):   2.35 cm AV Area (VTI):     2.62 cm AV Vmax:           144.00 cm/s AV Vmean:          96.100 cm/s AV VTI:            0.184 m AV Peak Grad:      8.3 mmHg AV Mean Grad:      4.0 mmHg LVOT Vmax:         104.00 cm/s LVOT Vmean:        65.200 cm/s LVOT VTI:          0.139 m LVOT/AV VTI ratio: 0.76  AORTA Ao Root diam: 3.20 cm MITRAL VALVE               TRICUSPID VALVE MV Area (PHT): 3.76 cm    TR Peak grad:   44.6 mmHg MV Area VTI:   2.56 cm    TR Vmax:        334.00 cm/s MV Peak grad:  5.3 mmHg MV Mean grad:  2.0 mmHg    SHUNTS MV Vmax:       1.15 m/s  Systemic VTI:  0.14 m MV Vmean:      69.7 cm/s   Systemic Diam: 2.10 cm MV Decel Time: 202 msec MV E velocity: 55.00 cm/s MV A velocity: 96.40 cm/s MV E/A ratio:  0.57 Cara JONETTA Lovelace MD Electronically signed by Cara JONETTA Lovelace MD Signature Date/Time: 07/29/2024/1:48:43 PM    Final    CT CHEST WO CONTRAST Result Date: 07/28/2024 EXAM: CT CHEST WITHOUT CONTRAST 07/28/2024 06:19:00 PM TECHNIQUE: CT of the chest was  performed without the administration of intravenous contrast. Multiplanar reformatted images are provided for review. Automated exposure control, iterative reconstruction, and/or weight based adjustment of the mA/kV was utilized to reduce the radiation dose to as low as reasonably achievable. COMPARISON: None available. CLINICAL HISTORY: Pneumonia, complication suspected, xray done; Respiratory illness, nondiagnostic xray. FINDINGS: MEDIASTINUM: Small pericardial effusion. Atheromatous calcifications of aorta and coronary arteries. The central airways are clear. LYMPH NODES: No mediastinal, hilar or axillary lymphadenopathy. LUNGS AND PLEURA: Irregularly marginated 1.3 cm pleural-based left apical nodule. Atelectasis or dense alveolar consolidation of the right lower lobe. Small right-sided pleural effusion. No pneumothorax. SOFT TISSUES/BONES: Thoracic degenerative disc disease. No acute abnormality of the soft tissues. UPPER ABDOMEN: Limited images of the upper abdomen demonstrate ascites. IMPRESSION: 1. Atelectasis or dense alveolar consolidation of the right lower lobe 2. Irregularly marginated 1.3 cm (13 mm) pleural-based left apical pulmonary nodule; per Fleischner Society Guidelines for a single solid nodule 8.120 mm, recommend further evaluation with non-contrast chest CT at 3 months, PET/CT, or tissue sampling depending on clinical risk and morphology 3. Small right-sided pleural effusion 4. Small pericardial effusion Electronically signed by: Fonda Field MD 07/28/2024 06:27 PM EST RP Workstation: [id number]   DG Chest 1 View Result Date: 07/28/2024 CLINICAL DATA:  Shortness of breath. Abdominal aortic aneurysm rupture and repair. EXAM: CHEST  1 VIEW COMPARISON:  07/27/2024. FINDINGS: The heart size and mediastinal contours are stable. There is atherosclerotic calcification aorta. Lung volumes are low. Hazy opacity is noted at the right lung base. There is mild atelectasis at the left lung base. No  pneumothorax is seen. No acute osseous abnormality. IMPRESSION: 1. Hazy opacity at the right lung base, possible atelectasis, infiltrate, and or pleural effusion. 2. Mild atelectasis at the left lung base. Electronically Signed   By: Leita Birmingham M.D.   On: 07/28/2024 14:52   DG Chest Port 1 View Result Date: 07/27/2024 CLINICAL DATA:  Respiratory failure with hypoxia. EXAM: PORTABLE CHEST 1 VIEW COMPARISON:  07/26/2024 FINDINGS: Lungs are hypoinflated with mild hazy bibasilar opacification likely due to atelectasis and possible small amount of bilateral pleural fluid. Infection in the lung bases is possible. Borderline stable cardiomegaly. Remainder of the exam is unchanged. IMPRESSION: Hypoinflation with mild hazy bibasilar opacification likely due to atelectasis and possible small amount of bilateral pleural fluid. Infection in the lung bases is possible. Electronically Signed   By: Toribio Agreste M.D.   On: 07/27/2024 11:15   DG Chest Port 1 View Result Date: 07/26/2024 CLINICAL DATA:  Respiratory with hypoxia. EXAM: PORTABLE CHEST 1 VIEW COMPARISON:  Radiographs 07/25/2024.  Abdominal CT 07/25/2024. FINDINGS: 0813 hours. Tip of the endotracheal tube is 5.7 cm above the carina. Enteric tube has been advanced with the side hole below the gastroesophageal junction. Right IJ central venous catheter projects to the inferior right atrium. Persistent low lung volumes with patchy opacities at both lung bases, likely atelectasis. Stable mild blunting of the left costophrenic angle without significant pleural effusion on recent CT. Stable cardiomegaly  and mediastinal contours. IMPRESSION: 1. Interval advancement of enteric tube with side hole below the gastroesophageal junction. 2. No other significant changes. Persistent low lung volumes with bibasilar atelectasis. Electronically Signed   By: Elsie Perone M.D.   On: 07/26/2024 12:05   PERIPHERAL VASCULAR CATHETERIZATION Result Date: 07/25/2024 See  surgical note for result.  DG Abd 1 View Result Date: 07/25/2024 CLINICAL DATA:  OG placement EXAM: ABDOMEN - 1 VIEW COMPARISON:  CT 07/25/2024 FINDINGS: Right-sided central venous catheter tip at the low right atrium. Enteric tube tip folded back upon itself in the region of the stomach. Nonobstructed gas pattern. Aortic stent graft is noted. IMPRESSION: Enteric tube tip folded back upon itself in the region of the stomach. Electronically Signed   By: Luke Bun M.D.   On: 07/25/2024 16:30   DG Chest Port 1 View Result Date: 07/25/2024 CLINICAL DATA:  Intubated central line EXAM: PORTABLE CHEST 1 VIEW COMPARISON:  None Available. FINDINGS: Endotracheal tube tip is about 4.5 cm superior to the carina. Enteric tube tip at the level of distal esophagus, side-port in the region of mid esophagus. Right IJ central venous catheter tip at the low right atrium. Hypoventilatory changes. Mild cardiomegaly. Possible small left effusion. Suspect patchy atelectasis at the left base. IMPRESSION: 1. Endotracheal tube tip about 4.5 cm superior to the carina. 2. Enteric tube tip at the level of distal esophagus, side-port in the region of mid esophagus. Recommend advancement. See separately dictated abdominal radiograph 3. Right IJ central venous catheter tip at the low right atrium. 4. Hypoventilatory changes with possible small left effusion and patchy atelectasis at the left base. Electronically Signed   By: Luke Bun M.D.   On: 07/25/2024 16:29   CT ABDOMEN PELVIS W CONTRAST Result Date: 07/25/2024 CLINICAL DATA:  Acute right flank pain with associated vomiting. History of abdominal aortic aneurysm measuring approximately 4.5 x 4.9 cm in dimensions by CTA in 2021. EXAM: CT ABDOMEN AND PELVIS WITH CONTRAST TECHNIQUE: Multidetector CT imaging of the abdomen and pelvis was performed using the standard protocol following bolus administration of intravenous contrast. RADIATION DOSE REDUCTION: This exam was  performed according to the departmental dose-optimization program which includes automated exposure control, adjustment of the mA and/or kV according to patient size and/or use of iterative reconstruction technique. CONTRAST:  OMNIPAQUE  IOHEXOL  300 MG/ML  SOLN COMPARISON:  CTA abdomen pelvis 08/10/2020 FINDINGS: Lower chest: No acute abnormality. Hepatobiliary: No focal liver abnormality is seen. No gallstones, gallbladder wall thickening, or biliary dilatation. Pancreas: Unremarkable. No pancreatic ductal dilatation or surrounding inflammatory changes. Spleen: Normal in size without focal abnormality. Adrenals/Urinary Tract: Adrenal glands are unremarkable. Kidneys are normal, without renal calculi, focal lesion, or hydronephrosis. Bladder is unremarkable. Stomach/Bowel: Bowel shows no evidence of obstruction, ileus, inflammation or lesion. The appendix is normal. No free intraperitoneal air. Vascular/Lymphatic: No lymphadenopathy. Significant interval enlargement infrarenal abdominal aortic aneurysm now measuring approximately 5.7 x 6.9 cm in maximum transverse dimensions with maximum oblique diameter of 7.0 cm. There is evidence to suggest acute focal leak from the right anterior aspect of the aneurysm with retroperitoneal stranding anterior to the aneurysm and to the right of the aneurysm also abutting the posterior duodenum and anterior aspect of the IVC. No dissection. Mural thrombus present in the aneurysm sac. Interval enlargement proximal right common iliac artery aneurysm now measuring up to 3.5 cm. Reproductive: Status post hysterectomy. No adnexal masses. Other: Umbilical hernia containing fat. Musculoskeletal: No acute or significant osseous findings. IMPRESSION: 1. Interval  enlargement of infrarenal abdominal aortic aneurysm now measuring up to 7 cm in greatest diameter. Evidence of acute focal retroperitoneal stranding anterior and to the right of the aneurysm consistent with focal  hemorrhage/leak from the aneurysm. 2. Interval enlargement of right common iliac artery aneurysm now measuring up to 3.5 cm. 3. These results were called to the ordering clinician emergently by the CT Department and the patient will be transported to the Emergency Department. Electronically Signed   By: Marcey Moan M.D.   On: 07/25/2024 12:06    ECHO as above  TELEMETRY (personally reviewed): sinus rhythm PACs rate 100s  EKG (personally reviewed): sinus tachycardia rate 114 bpm  Data reviewed by me 07/29/2024: last 24h vitals tele labs imaging I/O ED provider note, admission H&P, PCCM notes, vascular surgery notes  Principal Problem:   AAA (abdominal aortic aneurysm, ruptured) (HCC) Active Problems:   Hemorrhagic shock (HCC)   Acute respiratory failure with hypoxia (HCC)   Atrial fibrillation with rapid ventricular response (HCC)    ASSESSMENT AND PLAN:  SAIRAH KNOBLOCH is a 78 y.o. female  with a past medical history of peripheral vascular disease, hypertension, type II diabetes who presented to the ED on 07/25/2024 for abdominal pain, found to have ruptured aortic aneurysm. Underwent repair on 07/25/2024. Over the weekend into this AM she developed worsening respiratory status. Troponin noted to be elevated. Cardiology was consulted for further evaluation.   # Elevated troponin, possibly demand # Acute hypoxic respiratory failure # AAA with rupture s/p repair 07/25/24 # Paroxysmal atrial fibrillation # Hypertension Patient presented with abdominal pain, found to have ruptured AAA and underwent repair on 07/25/24. Yesterday developed worsening SOB and required BiPAP. Also with tachycardia - AF RVR and started on IV amiodarone. Troponins today found to be elevated at 2159 from 770 yesterday. CXR with patchy atelectasis. Net positive 7.4L since admit. Echo this admission with EF 65-70%, no WMAs, moderate asymmetric LVH, grade I diastolic dysfunction, moderate RV enlargement with mildly  reduced function, trivial MR.  - BNP ordered. Will increase IV lasix  to 40 mg BID. - Suspect troponin elevation secondary to demand given her decompensated respiratory status, recent surgery, blood loss.  Will see how labs trend.  Echo overall reassuring. - Continue IV amiodarone. Currently not on heparin  given anemia. Will revisit tomorrow. - Further management per PCCM, vascular surgery.   This patient's plan of care was discussed and created with Dr. Florencio and he is in agreement.  Signed: Danita Bloch, PA-C  07/29/2024, 2:15 PM [facility]

## 2024-07-29 NOTE — Progress Notes (Addendum)
 Pt transported to vascular radiology accompanied by RT and vascular radiology RN, Carlyon.

## 2024-07-30 ENCOUNTER — Inpatient Hospital Stay

## 2024-07-30 ENCOUNTER — Encounter: Payer: Self-pay | Admitting: Vascular Surgery

## 2024-07-30 DIAGNOSIS — E8721 Acute metabolic acidosis: Secondary | ICD-10-CM | POA: Diagnosis not present

## 2024-07-30 DIAGNOSIS — I713 Abdominal aortic aneurysm, ruptured, unspecified: Secondary | ICD-10-CM | POA: Diagnosis not present

## 2024-07-30 DIAGNOSIS — J9601 Acute respiratory failure with hypoxia: Secondary | ICD-10-CM | POA: Diagnosis not present

## 2024-07-30 DIAGNOSIS — R571 Hypovolemic shock: Secondary | ICD-10-CM | POA: Diagnosis not present

## 2024-07-30 DIAGNOSIS — I2602 Saddle embolus of pulmonary artery with acute cor pulmonale: Secondary | ICD-10-CM

## 2024-07-30 LAB — PREPARE RBC (CROSSMATCH)

## 2024-07-30 LAB — BASIC METABOLIC PANEL WITH GFR
Anion gap: 9 (ref 5–15)
BUN: 26 mg/dL — ABNORMAL HIGH (ref 8–23)
CO2: 28 mmol/L (ref 22–32)
Calcium: 8.5 mg/dL — ABNORMAL LOW (ref 8.9–10.3)
Chloride: 104 mmol/L (ref 98–111)
Creatinine, Ser: 1.29 mg/dL — ABNORMAL HIGH (ref 0.44–1.00)
GFR, Estimated: 42 mL/min — ABNORMAL LOW (ref >60)
Glucose, Bld: 132 mg/dL — ABNORMAL HIGH (ref 70–99)
Potassium: 3.7 mmol/L (ref 3.5–5.1)
Sodium: 140 mmol/L (ref 135–145)

## 2024-07-30 LAB — CBC
HCT: 22.2 % — ABNORMAL LOW (ref 36.0–46.0)
HCT: 26.1 % — ABNORMAL LOW (ref 36.0–46.0)
Hemoglobin: 6.9 g/dL — ABNORMAL LOW (ref 12.0–15.0)
Hemoglobin: 8.4 g/dL — ABNORMAL LOW (ref 12.0–15.0)
MCH: 27.7 pg (ref 26.0–34.0)
MCH: 28.4 pg (ref 26.0–34.0)
MCHC: 31.1 g/dL (ref 30.0–36.0)
MCHC: 32.2 g/dL (ref 30.0–36.0)
MCV: 89.2 fL (ref 80.0–100.0)
MCV: 88.2 fL (ref 80.0–100.0)
Platelets: 138 K/uL — ABNORMAL LOW (ref 150–400)
Platelets: 155 K/uL (ref 150–400)
RBC: 2.49 MIL/uL — ABNORMAL LOW (ref 3.87–5.11)
RBC: 2.96 MIL/uL — ABNORMAL LOW (ref 3.87–5.11)
RDW: 16.5 % — ABNORMAL HIGH (ref 11.5–15.5)
RDW: 16.4 % — ABNORMAL HIGH (ref 11.5–15.5)
WBC: 8.3 K/uL (ref 4.0–10.5)
WBC: 9.6 K/uL (ref 4.0–10.5)
nRBC: 1.6 % — ABNORMAL HIGH (ref 0.0–0.2)
nRBC: 2.8 % — ABNORMAL HIGH (ref 0.0–0.2)

## 2024-07-30 LAB — PHOSPHORUS: Phosphorus: 2.8 mg/dL (ref 2.5–4.6)

## 2024-07-30 LAB — PRO BRAIN NATRIURETIC PEPTIDE: Pro Brain Natriuretic Peptide: 6245 pg/mL — ABNORMAL HIGH (ref <300.0)

## 2024-07-30 LAB — GLUCOSE, CAPILLARY
Glucose-Capillary: 136 mg/dL — ABNORMAL HIGH (ref 70–99)
Glucose-Capillary: 127 mg/dL — ABNORMAL HIGH (ref 70–99)
Glucose-Capillary: 114 mg/dL — ABNORMAL HIGH (ref 70–99)
Glucose-Capillary: 124 mg/dL — ABNORMAL HIGH (ref 70–99)
Glucose-Capillary: 141 mg/dL — ABNORMAL HIGH (ref 70–99)

## 2024-07-30 LAB — HEPARIN LEVEL (UNFRACTIONATED)
Heparin Unfractionated: 0.4 [IU]/mL (ref 0.30–0.70)
Heparin Unfractionated: 0.5 [IU]/mL (ref 0.30–0.70)

## 2024-07-30 LAB — MAGNESIUM: Magnesium: 2.1 mg/dL (ref 1.7–2.4)

## 2024-07-30 MED ORDER — FUROSEMIDE 10 MG/ML IJ SOLN
40.0000 mg | Freq: Once | INTRAMUSCULAR | Status: AC
  Administered 2024-07-30: 40 mg via INTRAVENOUS
  Filled 2024-07-30: qty 4

## 2024-07-30 MED ORDER — POTASSIUM CHLORIDE 10 MEQ/50ML IV SOLN
10.0000 meq | INTRAVENOUS | Status: AC
  Administered 2024-07-30 (×3): 10 meq via INTRAVENOUS
  Filled 2024-07-30 (×3): qty 50

## 2024-07-30 MED ORDER — SODIUM CHLORIDE 0.9% IV SOLUTION: Freq: Once | INTRAVENOUS | Status: AC

## 2024-07-30 NOTE — Progress Notes (Signed)
 PHARMACY CONSULT NOTE - FOLLOW UP  Pharmacy Consult for Electrolyte Monitoring and Replacement   Recent Labs: Potassium (mmol/L)  Date Value  07/30/2024 3.7  08/23/2013 3.4 (L)   Magnesium (mg/dL)  Date Value  88/81/7974 2.1   Calcium (mg/dL)  Date Value  88/81/7974 8.5 (L)   Calcium, Total (mg/dL)  Date Value  87/87/7985 8.2 (L)   Albumin (g/dL)  Date Value  88/82/7974 3.1 (L)   Phosphorus (mg/dL)  Date Value  88/81/7974 2.8   Sodium (mmol/L)  Date Value  07/30/2024 140  08/23/2013 137     Assessment: 78 year old female with history of PAD, T2DM, and HTN who presented with abdominal pain found to have a ruptured abdominal aneurysm, admitted with hemorrhagic shock and taken emergency for AAA repair with vascular surgery. Pharmacy is asked to follow and replace electrolytes while in CCU  Goal of Therapy:  Potassium 4.0 - 5.1 mmol/L Magnesium 2.0 - 2.4 mg/dL All Other Electrolytes WNL  Plan:  ---10 mEq IV KCl x 3 ---recheck electrolytes in am  Rose Perry ,PharmD Clinical Pharmacist 07/30/2024 7:28 AM

## 2024-07-30 NOTE — Progress Notes (Signed)
 PT Cancellation Note  Patient Details Name: LATRISHA COIRO MRN: [id number] DOB: 07-26-1946   Cancelled Treatment:    Reason Eval/Treat Not Completed: Medical issues which prohibited therapy (Patient continues to need Bi-pap for respiratory issues. Will continue with attempts as appropriate.)  Randine Essex, PT, MPT  Randine LULLA Essex 07/30/2024, 10:34 AM

## 2024-07-30 NOTE — Progress Notes (Signed)
 OT Cancellation Note  Patient Details Name: Rose Perry MRN: [id number] DOB: March 06, 1946   Cancelled Treatment:    Reason Eval/Treat Not Completed: Medical issues which prohibited therapy. Pt remains on BiPAP and not able to actively participate in therapeutic intervention at this time. OT to hold and re-attempt when pt able to participate.   [name], MS, OTR/L , CBIS ascom [phone]  07/30/24, 10:41 AM

## 2024-07-30 NOTE — Progress Notes (Signed)
 NAME:  Rose Perry, MRN:  [id number], DOB:  1946-04-02, LOS: 5 ADMISSION DATE:  07/25/2024, CONSULTATION DATE:  07/25/24 REFERRING MD:  Dr. Cordella Shawl, CHIEF COMPLAINT:  Abdominal pain   Brief Pt Description / Synopsis:  78 year old female with history of PAD, T2DM, and HTN who presented with abdominal pain found to have a ruptured abdominal aneurysm, admitted with hemorrhagic shock and taken emergency for AAA repair with vascular surgery. Remained intubated post-op due to severe metabolic acidosis.  History of Present Illness:  [name] is a 78 y.o. female who presented to [facility] ED from [facility] walk-in clinic for worsening abdominal pain x 2 days, to which a STAT Abd/Pelvis CT was ordered, resulting with concern for possible ruptured aneurysm. The patient was transported to [facility] ED for further workup. She reported the abdominal pain has been present for 2 weeks but significantly increased over the past two days. Denied shortness of breath and chest pain.  ED Course: Vitals on arrival: Temp 97.8, BP 149/65, HR 74, RR 15, SpO2 95%  Medications Administered: Cefazolin  preoperatively Morphine  NS bolus Zofran  NS gtt  Pertinent Labs/Diagnostics: Initial CBC unremarkable BMP unremarkable Troponin: 20 Lipase 17 11/13 CT abdomen/pelvis: AAA measuring 7cm, as well as a right iliac aneurysm measuring 3.5cm.   Vascular surgery emergently consulted. Upon further evaluation they decided to take her to the vascular lab for AAA repair 11/13. While waiting to go to the OR, she developed severe hypotension (80/69) and tachycardia requiring initiating of norepinephrine, as well as IVF resuscitation. She was emergently taken to the vascular lab, with worsening hypotension requiring phenylephrine , epinephrine  and vasopressin. Massive Transfusion Protocol was initiated and she was mechanically intubated. A total of 6mg  Versed  was given, 3 grams on albumin, calcium chloride 0.25g, 6units  pRBC, 2 units of FFP and 1 unit of platelets. Repeat Hgb was 6.9.   PCCM was consulted for ICU admission due to hypotension requiring vasopressors and mechanical ventilation.  07/28/24: PCCM reconsulted for acute respiratory distress with hypoxemia.  Nurse states that patient was moved from bed to chair and she became acutely dyspneic and hypoxic with SpO2 in the 70s.  She was placed on a nonrebreather but her SpO2 only came up to about 85% and she continued to be dyspneic and also reported mild chest discomfort.  Stat labs and a stat chest x-ray were obtained.  Her ABG on nonrebreather showed a pH of 7.41, pCO2 of 48, pO2 of 52, acid base excess of 5.1 and a bicarb level of 30.4 and an oxygen saturation of 89%.  CBC showed a hemoglobin of 8.1 and a hematocrit of 25.1.  Lactic acid was negative and her troponin was 76.  A chest x-ray showed right lung base infiltrate and pleural effusion.   Pertinent  Medical History  Hypertension Type II Diabetes Mellitus Hyperlipidemia Celiac Artery Stenosis  Significant Hospital Events: Including procedures, antibiotic start and stop dates in addition to other pertinent events   11/13: CT Abd/pelvis identified 7mm AAA concern for rupture, pt decompensated and was emergently taken to the vascular lab. Placed on levo, later requiring vaso, neo and epi during the procedure. MTP initiated, repeat Hgb was 6.9 (previous 13.5). Admitted to the ICU for mechanical ventilation management and hypotension requiring vasopressors. Started on fentanyl  gtt.  11/14: No significant events overnight.  AFebrile, on low dose levophed.  Hgb remained stable overnight, on minimal vent support, WUA & SBT as tolerated.  Diurese with 40 mg + 60 mg  IV Lasix  dose, hopeful for extubation to BiPAP. 11/15: Weaned off Levophed overnight.  Developed tachycardic in afib, rate 140-150s, started on amiodarone gtt. 07/28/24: PCCM reconsulted for acute respiratory distress with hypoxemia; patient  placed on BiPAP 07/29/24: Placed on HFNC from bipap, subsequent O2 requirements increased up to 10L, later placed back on bipap. Troponin 2159, ECHO shows EF 65-70% with moderate RV enlargement; cardio consulted. 07/30/24: S/P mechanical thrombectomy for saddle pulmonary embolism. Remains on bipap; attempted to wean FiO2 to 40% overnight but pt desatted to the 80's; placed back on 60%. Will try to wean requirements and transition to [facility] today.   Interim History / Subjective:  She is being maintained on continuous BiPAP following event yesterday. Tolerated well, switched her to HFNC this am, with subsequent increasing O2 requirements. Back on Bipap. Troponin 2159. ECHO: EF 65-70% with moderate RV enlargement.  Objective    Blood pressure (!) 102/45, pulse 92, temperature 99.7 F (37.6 C), temperature source Axillary, resp. rate 17, height 5' 7 (1.702 m), weight 107.6 kg, SpO2 95%.    FiO2 (%):  [60 %-100 %] 60 % PEEP:  [5 cmH20] 5 cmH20 Pressure Support:  [5 cmH20] 5 cmH20   Intake/Output Summary (Last 24 hours) at 07/30/2024 0858 Last data filed at 07/30/2024 9377 Gross per 24 hour  Intake 2053.55 ml  Output 1240 ml  Net 813.55 ml   Filed Weights   07/25/24 1210 07/30/24 0448  Weight: 107.2 kg 107.6 kg    Examination: General: ill appearing female, laying in bed, awake and oriented, on bipap HEENT: atraumatic, normocephalic, supple, no JVD Lungs: normal effort, equal breath sounds throughout, on bipap, no rhonchi/wheezing/crackles Cardiovascular: NSR, regular rate S1 S2, no m/r/g Abdomen: soft, mild distention and tenderness, no rebound/guarding, bowel sounds x 4 Extremities: warm, dry, intact, radial pulses 2+, distal pulses 1+, no edema Neuro: **alert and oriented x 3, follows commands, no focal neuro deficits, PERRL GU: deferred  Resolved problem list  Mechanical Intubation for Airway Protection Intraoperatively & Metabolic Acidosis Hemorrhagic Shock iso AAA  Rupture  Assessment and Plan    #Acute Hypoxic Encephalopathy #ICU Delirium Risk - Continue bipap for hypoxia - Monitor neuro status - ICU delirium protocol  #Hemorrhagic Shock s/t AAA Rupture s/p Stent Placement ~ Resolved #Afib with RVR Hx: Hypertension, HLD, Celiac Artery Stenosis 11/17 ECHO: EF 65-70% with moderately enlarged RV - Continuous cardiac monitoring - Vasopressors to maintain MAP goal >65 ~ not currently requiring - Continue Amiodarone gtt - Troponin peaked at 2159 - Lasix  40mg  x 1 - Will recheck BNP in the am - EKG prn - Vascular surgery following; appreciate input - Cardiology consult; appreciate input  #Mechanical Intubation ~ Resolved #Acute Hypoxic Respiratory Failure #Symptomatic Pulmonary Emboli with RH Strain S/P Thrombectomy (07/29/24) 11/17 Repeat CXR: mildly increased pathcy atelectasis or pneumonia at RLL base; minimally improved LLL atelectasis; stable mild cardiomegaly 11/16 Chest CT: atelectasis vs dense alveolar consolidation of RLL;  pleural-based left apical pulmonary nodule; small right sided pleural effusion; small pericardial effusion 11/17 CTA Chest PE: Saddle pulmonary embolism extending into segmental branches of all lobes bilaterally. Positive for acute PE with CTevidence of right heart strain (RV/LV Ratio = 1.2) consistent with at least submassive (intermediate risk) PE. The presence of right heart strain has been associated with an increased risk of morbidity and mortality. Moderate cardiomegaly. Small pericardial effusion. Trace left and small right pleural effusions. Right lower lobe airspace consolidation and collapse. Moderate ascites in the upper abdomen. - Supplemental oxygen to  maintain O2 >92% - Wean FiO2 and PEEP as able - On bipap at 60% this am - Attempted HFNC but subsequently placed back on Bipap - Duonebs prn - Intermittent CXR and ABG - Bilateral LE US  ordered to r/o PE  #Acute Kidney Injury #Metabolic Acidosis s/t  Hemorrhagic Shock iso AAA Rupture ~ Resolved #Hypocalcemia - Strict I/O - Trend BMP and monitor renal function - Cr continues to improve - Avoid nephrotoxins as able - Ensure adequate renal perfusion - ICU electrolyte replacement protocol - Pharmacy to assist with replacement  #GI Prophylaxis - Diet: Full liquids - GI ppx: Protonix  - Constipation protocol with scheduled Miralax   #Acute Blood Loss Anemia s/t AAA Rupture #Thrombocytopenia - Trend CBC and check coags prn - Transfuse if Hgb <7; post op Hgb 6.9, received 1 unit pRBCs - Recheck H/H 1 hour post transfusion - Monitor for s/sx of bleeding - DVT Prophylaxis: SCDs  #Type II Diabetes Mellitus - ICU hypo/hyperglycemia protocol - BG goal range: 140-180 - CBG ac/hs - SSI: Novolog    #HCAP - Trend WBC and monitor fever curve - Blood cultures x 2 - ABX: Zosyn  - Narrow abx pending culture and sensitivities - Dc'd Vanc due to negative MRSA PCR  Best Practice: Diet/type: NPO while on BiPAP VTE prophylaxis:  SCD's/ Heparin  started due to saddle PEs GI prophylaxis: PPI Lines: Peripheral IV Foley: In place and still needed Code Status: Full code   Labs   CBC: Recent Labs  Lab 07/28/24 0321 07/28/24 1459 07/29/24 0531 07/29/24 2019 07/30/24 0429  WBC 10.5 10.0 5.2 7.4 8.3  HGB 7.7* 8.1* 7.8* 7.1* 6.9*  HCT 24.2* 25.1* 24.5* 22.6* 22.2*  MCV 86.7 87.2 88.8 87.9 89.2  PLT 136* 133* 140* 135* 138*    Basic Metabolic Panel: Recent Labs  Lab 07/27/24 0333 07/27/24 1706 07/28/24 0321 07/28/24 1459 07/29/24 0531 07/29/24 2019 07/30/24 0429  NA 142 142 142 142 142 140 140  K 3.6 3.4* 4.0 3.9 3.7 3.3* 3.7  CL 107 107 107 106 106 102 104  CO2 24 26 26 25 27 29 28   GLUCOSE 144* 128* 128* 150* 140* 154* 132*  BUN 23 21 21 22  25* 25* 26*  CREATININE 1.43* 1.25* 1.21* 1.18* 1.13* 1.30* 1.29*  CALCIUM 8.3* 8.4* 8.3* 8.6* 8.6* 8.4* 8.5*  MG 2.0 1.8 1.9 1.9 2.2 2.1 2.1  PHOS 3.7 2.5  --  2.5  --  2.7 2.8    GFR: Estimated Creatinine Clearance: 44.7 mL/min (A) (by C-G formula based on SCr of 1.29 mg/dL (H)). Recent Labs  Lab 07/25/24 2343 07/26/24 0415 07/28/24 1459 07/29/24 0531 07/29/24 1600 07/29/24 2019 07/29/24 2020 07/30/24 0429  WBC  --    < > 10.0 5.2  --  7.4  --  8.3  LATICACIDVEN 2.1*  --  1.0  --  1.9  --  1.0  --    < > = values in this interval not displayed.    Liver Function Tests: Recent Labs  Lab 07/25/24 1236 07/27/24 1706 07/29/24 0531 07/29/24 2019  AST 20  --  102* 99*  ALT 14  --  17 27  ALKPHOS 83  --  54 60  BILITOT 0.4  --  0.7 0.6  PROT 7.2  --  5.7* 5.4*  ALBUMIN 4.0 3.5 3.4* 3.1*   Recent Labs  Lab 07/25/24 1236  LIPASE 17   No results for input(s): AMMONIA in the last 168 hours.  ABG    Component Value Date/Time  PHART 7.41 07/28/2024 1419   PCO2ART 48 07/28/2024 1419   PO2ART 52 (L) 07/28/2024 1419   HCO3 30.4 (H) 07/29/2024 2043   ACIDBASEDEF 6.6 (H) 07/25/2024 1807   O2SAT 92.5 07/29/2024 2043     Coagulation Profile: Recent Labs  Lab 07/25/24 1419 07/25/24 2343 07/29/24 1741  INR 2.2* 1.1 1.1    Cardiac Enzymes: No results for input(s): CKTOTAL, CKMB, CKMBINDEX, TROPONINI in the last 168 hours.  HbA1C: Hgb A1c MFr Bld  Date/Time Value Ref Range Status  07/25/2024 12:36 PM 6.5 (H) 4.8 - 5.6 % Final    Comment:    (NOTE) Diagnosis of Diabetes The following HbA1c ranges recommended by the American Diabetes Association (ADA) may be used as an aid in the diagnosis of diabetes mellitus.  Hemoglobin             Suggested A1C NGSP%              Diagnosis  <5.7                   Non Diabetic  5.7-6.4                Pre-Diabetic  >6.4                   Diabetic  <7.0                   Glycemic control for                       adults with diabetes.    08/09/2020 09:19 AM 6.5 (H) 4.8 - 5.6 % Final    Comment:    (NOTE) Pre diabetes:          5.7%-6.4%  Diabetes:              >6.4%  Glycemic  control for   <7.0% adults with diabetes     CBG: Recent Labs  Lab 07/29/24 1559 07/29/24 2037 07/29/24 2322 07/30/24 0409 07/30/24 0746  GLUCAP 146* 155* 142* 136* 127*    Review of Systems:   Denies chest pain, HA, dizziness, vision changes, hemoptysis and abdominal pain. Endorses minimal shortness of breath but overall feels good.   Past Medical History:  She,  has a past medical history of Celiac artery stenosis, CME (cystoid macular edema), CNVM (choroidal neovascular membrane), Diabetes mellitus without complication (HCC), Diverticulosis of colon, Elevated lipids, History of cataract, History of rectal bleeding, adenomatous colonic polyps, Hyperlipidemia, Hypertension, Hypertensive retinopathy of both eyes, Lymphedema, and Primary open angle glaucoma (POAG) of both eyes.   Surgical History:   Past Surgical History:  Procedure Laterality Date   ABDOMINAL HYSTERECTOMY     COLONOSCOPY     COLONOSCOPY WITH PROPOFOL  N/A 08/22/2016   Procedure: COLONOSCOPY WITH PROPOFOL ;  Surgeon: Lamar ONEIDA Holmes, MD;  Location[facility];  Service: Endoscopy;  Laterality: N/A;   COLONOSCOPY WITH PROPOFOL  N/A 08/11/2020   Procedure: COLONOSCOPY WITH PROPOFOL ;  Surgeon: Maryruth Ole ONEIDA, MD;  Location: [facility] ENDOSCOPY;  Service: Endoscopy;  Laterality: N/A;   COLONOSCOPY WITH PROPOFOL  N/A 11/12/2021   Procedure: COLONOSCOPY WITH PROPOFOL ;  Surgeon: Maryruth Ole ONEIDA, MD;  Location: [facility] ENDOSCOPY;  Service: Endoscopy;  Laterality: N/A;  DM   COLONOSCOPY WITH PROPOFOL  N/A 08/18/2023   Procedure: COLONOSCOPY WITH PROPOFOL ;  Surgeon: Maryruth Ole ONEIDA, MD;  Location: [facility] ENDOSCOPY;  Service: Endoscopy;  Laterality: N/A;   ENDOVASCULAR STENT GRAFT (AAA) N/A 07/25/2024  Procedure: ENDOVASCULAR STENT GRAFT (AAA);  Surgeon: Jama Cordella [name], MD;  Location: [facility] INVASIVE CV LAB;  Service: Cardiovascular;  Laterality: N/A;  ruptured AAA   ENDOVASCULAR STENT GRAFT REPAIR  07/25/2024    Procedure: ENDOVASCULAR STENT GRAFT REPAIR;  Surgeon: Jama Cordella [name], MD;  Location: [facility] INVASIVE CV LAB;  Service: Cardiovascular;;   ESOPHAGOGASTRODUODENOSCOPY (EGD) WITH PROPOFOL  N/A 08/11/2020   Procedure: ESOPHAGOGASTRODUODENOSCOPY (EGD) WITH PROPOFOL ;  Surgeon: Maryruth Ole DASEN, MD;  Location: [facility] ENDOSCOPY;  Service: Endoscopy;  Laterality: N/A;   EYE SURGERY     JOINT REPLACEMENT Left    left total knee replacement   LOWER EXTREMITY ANGIOGRAPHY Right 02/20/2024   Procedure: Lower Extremity Angiography;  Surgeon: Jama Cordella [name], MD;  Location: [facility] INVASIVE CV LAB;  Service: Cardiovascular;  Laterality: Right;   ltkr     needle in foot removed     POLYPECTOMY  08/18/2023   Procedure: POLYPECTOMY;  Surgeon: Maryruth Ole DASEN, MD;  Location: [facility] ENDOSCOPY;  Service: Endoscopy;;   REFRACTIVE SURGERY       Social History:   reports that she has quit smoking. She has never used smokeless tobacco. She reports that she does not drink alcohol and does not use drugs.   Family History:  Her family history includes Coronary artery disease in her brother; Hyperlipidemia in her mother; Hypertension in her father and mother. There is no history of Breast cancer.   Allergies Allergies  Allergen Reactions   Codeine      Home Medications  Prior to Admission medications   Medication Sig Start Date End Date Taking? Authorizing Provider  amLODipine (NORVASC) 5 MG tablet Take 1 tablet by mouth daily. 10/07/20   [provider]  aspirin  EC 81 MG tablet Take 1 tablet (81 mg total) by mouth daily. Swallow whole. 02/21/24 02/20/25  Schnier, Cordella [name], MD  atorvastatin (LIPITOR) 20 MG tablet Take 20 mg by mouth daily.    [provider]  atorvastatin (LIPITOR) 20 MG tablet Take 1 tablet by mouth daily. 05/10/21   [provider]  clopidogrel  (PLAVIX ) 75 MG tablet Take 1 tablet (75 mg total) by mouth daily. 02/21/24   Schnier, Gregory G, MD  metFORMIN (GLUCOPHAGE)  500 MG tablet Take 1 tablet by mouth 2 (two) times daily with a meal. 06/15/20   [provider]  metoprolol  tartrate (LOPRESSOR ) 25 MG tablet Take 0.5 tablets (12.5 mg total) by mouth 2 (two) times daily. Patient not taking: Reported on 06/17/2024 08/13/20   Josette Ade, MD  Multiple Vitamins-Minerals (SENTRY PO) Take by mouth daily.    [provider]  pantoprazole  (PROTONIX ) 40 MG tablet Take 1 tablet (40 mg total) by mouth daily. Patient not taking: Reported on 06/17/2024 08/13/20 01/25/25  Josette Ade, MD  polyethylene glycol (MIRALAX ) 17 g packet Take 17 g by mouth 2 (two) times daily. Patient not taking: Reported on 06/17/2024 08/14/20   Josette Ade, MD  polyethylene glycol powder (GLYCOLAX /MIRALAX ) 17 GM/SCOOP powder Take by mouth. Patient not taking: Reported on 06/17/2024 03/25/21   [provider]  potassium chloride  (KLOR-CON ) 10 MEQ tablet Take 10 mEq by mouth daily. 05/11/20   [provider]  timolol (TIMOPTIC) 0.5 % ophthalmic solution 1 drop 2 (two) times daily.    [provider]  triamterene-hydrochlorothiazide (MAXZIDE-25) 37.5-25 MG tablet Take 1 tablet by mouth daily. 10/07/23   [provider]   Critical Care Time: 65 minutes   Robet Kim, PA-C [facility] PCCM Team  Contact Info: [phone]

## 2024-07-30 NOTE — Progress Notes (Signed)
 Progress Note    07/30/2024 7:44 AM 1 Day Post-Op  Subjective:  Rose Perry is a 78 yo female who is now POD #4 from:   PROCEDURE: US  guidance for vascular access, bilateral femoral arteries Catheter placement into aorta from bilateral femoral approaches Catheter placement into the right internal iliac artery from the left common femoral approach third order catheter placement Coil embolization of the right internal iliac artery for treatment of the right common iliac artery aneurysm. Placement of a 28 x 14 x 12 C3 conformable Gore Excluder Endoprosthesis main body with a 16 x 10 left iliac extension limb with a 12 x 14 contralateral limb that extends into the right external iliac artery for treatment of the common iliac artery aneurysm Placement of a 28 mm proximal aortic extension cuff Selective catheterization of the left renal artery first-order catheter placement. Placement of a 7 mm x 26 mm Lifestream stent left renal artery ProGlide closure devices bilateral femoral arteries   PRE-OPERATIVE DIAGNOSIS: Ruptured juxtarenal AAA with hemodynamic shock   POST-OPERATIVE DIAGNOSIS: same   SURGEON: Rose Shawl, MD and Rose Gu, MD - Co-surgeons   ANESTHESIA: general  POD #1  Date of Surgery: 07/29/2024,7:54 PM   Surgeon:Rose Perry Rose Perry    Pre-operative Diagnosis: Symptomatic pulmonary emboli with right heart strain and hypoxia   Post-operative diagnosis:  Same   Procedure(s) Performed:             1.  Contrast injection right heart and bilateral pulmonary arteries             2.  Placement of a right IJ triple-lumen catheter with ultrasound guidance             3.  Mechanical thrombectomy bilateral lobar pulmonary arteries for removal of pulmonary emboli using the Penumbra CAT 8 thrombectomy catheter.             4.  Selective catheter placement right upper lobe pulmonary artery, middle lobe pulmonary artery and lower lobe pulmonary artery             5.   Selective catheter placement left upper lobe pulmonary artery and lower lobe pulmonary artery.             6.  Placement of a Denali infrarenal IVC filter  Patient is resting comfortably this morning in bed.  She remains on BiPAP with a heart rate of 90 and O2 saturations of 94%.  Nonlabored breathing this morning.  Bilateral lower extremities are warm to touch with palpable pulses.  No complications to note post pulmonary thrombectomy.  Patient is currently receiving 1 unit of packed red blood cells.  Family at the bedside granddaughter and sister.  Had a long conversation with them about the pulmonary thrombectomy yesterday and how well she is doing this morning.  Vitals:   07/30/24 0608 07/30/24 0623  BP: (!) 106/49 (!) 102/45  Pulse: 95 92  Resp: 18 17  Temp: 98.7 F (37.1 C) 98.5 F (36.9 C)  SpO2: 96% 95%   Physical Exam: Cardiac: Atrial Fibrillation with tachycardia, normal S1 and S2.  No murmurs appreciated. Lungs: Nonlabored breathing on BIPap.  No rales rhonchi or wheezing noted. Diminished lung sounds in the right lower and middle lobe.  Incisions: Bilateral groin puncture sites.  Dressings clean dry and intact and without any hematoma seroma to note. Extremities: All extremities are warm to touch.  Positive palpable pulses in bilateral lower extremities. Abdomen: Positive bowel sounds throughout, patient's mid abdomen remains  tender upon palpation but this is expected.  Nondistended this morning. Neurologic: Alert and oriented X 3. Answers all questions and follows commands.  CBC    Component Value Date/Time   WBC 8.3 07/30/2024 0429   RBC 2.49 (L) 07/30/2024 0429   HGB 6.9 (L) 07/30/2024 0429   HGB 11.9 (L) 08/22/2013 0455   HCT 22.2 (L) 07/30/2024 0429   HCT 43.8 08/07/2013 0925   PLT 138 (L) 07/30/2024 0429   PLT 217 08/22/2013 0455   MCV 89.2 07/30/2024 0429   MCV 78 (L) 08/07/2013 0925   MCH 27.7 07/30/2024 0429   MCHC 31.1 07/30/2024 0429   RDW 16.5 (H)  07/30/2024 0429   RDW 14.4 08/07/2013 0925   LYMPHSABS 2.0 08/13/2020 0527   MONOABS 0.5 08/13/2020 0527   EOSABS 0.4 08/13/2020 0527   BASOSABS 0.0 08/13/2020 0527    BMET    Component Value Date/Time   NA 140 07/30/2024 0429   NA 137 08/23/2013 0519   K 3.7 07/30/2024 0429   K 3.4 (L) 08/23/2013 0519   CL 104 07/30/2024 0429   CL 103 08/23/2013 0519   CO2 28 07/30/2024 0429   CO2 30 08/23/2013 0519   GLUCOSE 132 (H) 07/30/2024 0429   GLUCOSE 168 (H) 08/23/2013 0519   BUN 26 (H) 07/30/2024 0429   BUN 12 08/23/2013 0519   CREATININE 1.29 (H) 07/30/2024 0429   CREATININE 1.17 08/23/2013 0519   CALCIUM 8.5 (L) 07/30/2024 0429   CALCIUM 8.2 (L) 08/23/2013 0519   GFRNONAA 42 (L) 07/30/2024 0429   GFRNONAA 48 (L) 08/23/2013 0519   GFRAA 55 (L) 08/23/2013 0519    INR    Component Value Date/Time   INR 1.1 07/29/2024 1741   INR 1.0 08/07/2013 0925     Intake/Output Summary (Last 24 hours) at 07/30/2024 0744 Last data filed at 07/30/2024 9377 Gross per 24 hour  Intake 2153.55 ml  Output 1300 ml  Net 853.55 ml     Assessment/Plan:  78 y.o. female is s/p SEE ABOVE  1 Day Post-Op   PLAN Greatly appreciate ICU team and Hospitalist's input and care.  Continue Heparin  infusion.  Okay to obtain Venous duplex ultrasounds of the patients bilateral lower extremities.    DVT prophylaxis:  Heparin  infusion    [facility] 07/30/2024 7:44 AM

## 2024-07-30 NOTE — Progress Notes (Signed)
 PHARMACY - ANTICOAGULATION CONSULT NOTE  Pharmacy Consult for Heparin  Infusion Indication: pulmonary embolus  Allergies  Allergen Reactions   Codeine     Patient Measurements: Height: 5' 7 (170.2 cm) Weight: 107.6 kg (237 lb 3.4 oz) IBW/kg (Calculated) : 61.6 HEPARIN  DW (KG): 86.1  Vital Signs: Temp: 99.3 F (37.4 C) (11/18 1200) Temp Source: Axillary (11/18 1200) BP: 103/52 (11/18 1200) Pulse Rate: 84 (11/18 1200)  Labs: Recent Labs    07/29/24 0531 07/29/24 1741 07/29/24 2019 07/30/24 0429 07/30/24 1134  HGB 7.8*  --  7.1* 6.9*  --   HCT 24.5*  --  22.6* 22.2*  --   PLT 140*  --  135* 138*  --   APTT  --  34  --   --   --   LABPROT  --  15.2  --   --   --   INR  --  1.1  --   --   --   HEPARINUNFRC  --   --   --  0.40 0.50  CREATININE 1.13*  --  1.30* 1.29*  --     Estimated Creatinine Clearance: 44.7 mL/min (A) (by C-G formula based on SCr of 1.29 mg/dL (H)).   Medical History: Past Medical History:  Diagnosis Date   Celiac artery stenosis    CME (cystoid macular edema)    right   CNVM (choroidal neovascular membrane)    Diabetes mellitus without complication (HCC)    Diverticulosis of colon    Elevated lipids    History of cataract    History of rectal bleeding    Hx of adenomatous colonic polyps    Hyperlipidemia    Hypertension    Hypertensive retinopathy of both eyes    Lymphedema    Primary open angle glaucoma (POAG) of both eyes     Medications:  Not on anticoagulation previously  Assessment: Patient is a 78 year old female with a past medical history of peripheral vascular disease, hypertension, type II diabetes who presented to the ED on 07/25/2024 for abdominal pain, found to have ruptured aortic aneurysm that was repaired on 11/13. Patient taken for CTA today and was found to have a saddle pulmonary embolism extending into segmental branches of all lobes bilaterally. Pharmacy was consulted to initiate patient on a heparin   infusion.  Baseline labs: INR 1.1 aPTT 34s Hgb 7.8 and PLT 140- low but stable No signs/symptoms of bleeding noted in chart  Goal of Therapy:  Heparin  level 0.3-0.7 units/ml Monitor platelets by anticoagulation protocol: Yes   Plan: heparin  level therapeutic x 2 --continue heparin  infusion at 1350 units/hr --recheck next heparin  level in am and at least once daily while therapeutic -- Monitor CBC daily while on heparin    Adriana JONETTA Bolster, PharmD, BCPS 07/30/2024,12:38 PM

## 2024-07-30 NOTE — Progress Notes (Signed)
 Nutrition Follow Up Note   DOCUMENTATION CODES:   Obesity unspecified  INTERVENTION:   Recommend NGT placement and nutrition support if pt remains on bipap   RD will add supplements with diet advancement   Pt at high refeed risk; recommend monitor potassium, magnesium and phosphorus labs daily until stable  Daily weights   NUTRITION DIAGNOSIS:   Inadequate oral intake related to inability to eat (pt sedated and ventilated) as evidenced by NPO status. -ongoing   GOAL:   Patient will meet greater than or equal to 90% of their needs -not met   MONITOR:   Diet advancement, Labs, Weight trends, I & O's, Skin  ASSESSMENT:   78 y/o female with h/o AAA, DM, HTN, HLD, lymphedema and diverticulosis who is admitted with ruptured abdominal aneurysm and hemorrhagic shock s/p coil embolization 11/13 complicated by PE s/p mechanical thrombectomy and IVC filter 11/17.  Pt extubated 11/14. Pt remains on bipap. Pt has now been without adequate nutrition for 5 days. Would recommend NGT placement and nutrition support if pt remains on bipap. RD will add supplements with diet advancement. Pt is at high refeed risk. No BM since admission. Per chart, pt appears to be at her UBW currently.   Medications reviewed and include: colace, insulin , protonix , miralax , senokot, heparin , zosyn   Labs reviewed: K 3.7 wnl, BUN 26(H), creat 1.29(H), P 2.8 wnl, Mg 2.1 wnl Pro BNP- 6245.0(H) Hgb 8.4(L), Hct 26.1(L) Cbgs- 114, 127, 136 x 48 hrs   UOP-   Diet Order:   Diet Order             Diet clear liquid Room service appropriate? Yes; Fluid consistency: Thin  Diet effective now                  EDUCATION NEEDS:   No education needs have been identified at this time  Skin:  Skin Assessment: Reviewed RN Assessment (ecchymosis)  Last BM:  pta  Height:   Ht Readings from Last 1 Encounters:  07/25/24 5' 7 (1.702 m)    Weight:   Wt Readings from Last 1 Encounters:  07/30/24  107.6 kg    Ideal Body Weight:  61.3 kg  BMI:  Body mass index is 37.15 kg/m.  Estimated Nutritional Needs:   Kcal:  2000-2300kcal/day  Protein:  100-115g/day  Fluid:  1.7-1.9L/day  Augustin Shams MS, RD, LDN If unable to be reached, please send secure chat to RD inpatient available from 8:00a-4:00p daily

## 2024-07-30 NOTE — Plan of Care (Signed)
  Problem: Education: Goal: Knowledge of General Education information will improve Description: Including pain rating scale, medication(s)/side effects and non-pharmacologic comfort measures Outcome: Progressing   Problem: Clinical Measurements: Goal: Ability to maintain clinical measurements within normal limits will improve Outcome: Progressing   Problem: Pain Managment: Goal: General experience of comfort will improve and/or be controlled Outcome: Progressing   Problem: Safety: Goal: Ability to remain free from injury will improve Outcome: Progressing   Problem: Skin Integrity: Goal: Risk for impaired skin integrity will decrease Outcome: Progressing   Problem: Respiratory: Goal: Will achieve and/or maintain a regular respiratory rate, without signs or symptoms of dyspnea Outcome: Progressing

## 2024-07-30 NOTE — Progress Notes (Signed)
 PHARMACY - ANTICOAGULATION CONSULT NOTE  Pharmacy Consult for Heparin  Infusion Indication: pulmonary embolus  Allergies  Allergen Reactions   Codeine     Patient Measurements: Height: 5' 7 (170.2 cm) Weight: 107.6 kg (237 lb 3.4 oz) IBW/kg (Calculated) : 61.6 HEPARIN  DW (KG): 86.1  Vital Signs: Temp: 98.6 F (37 C) (11/18 0000) Temp Source: Axillary (11/18 0000) BP: 104/54 (11/18 0500) Pulse Rate: 96 (11/18 0500)  Labs: Recent Labs    07/29/24 0531 07/29/24 1741 07/29/24 2019 07/30/24 0429  HGB 7.8*  --  7.1* 6.9*  HCT 24.5*  --  22.6* 22.2*  PLT 140*  --  135* 138*  APTT  --  34  --   --   LABPROT  --  15.2  --   --   INR  --  1.1  --   --   HEPARINUNFRC  --   --   --  0.40  CREATININE 1.13*  --  1.30* 1.29*    Estimated Creatinine Clearance: 44.7 mL/min (A) (by C-G formula based on SCr of 1.29 mg/dL (H)).   Medical History: Past Medical History:  Diagnosis Date   Celiac artery stenosis    CME (cystoid macular edema)    right   CNVM (choroidal neovascular membrane)    Diabetes mellitus without complication (HCC)    Diverticulosis of colon    Elevated lipids    History of cataract    History of rectal bleeding    Hx of adenomatous colonic polyps    Hyperlipidemia    Hypertension    Hypertensive retinopathy of both eyes    Lymphedema    Primary open angle glaucoma (POAG) of both eyes     Medications:  Not on anticoagulation previously  Assessment: Patient is a 78 year old female with a past medical history of peripheral vascular disease, hypertension, type II diabetes who presented to the ED on 07/25/2024 for abdominal pain, found to have ruptured aortic aneurysm that was repaired on 11/13. Patient taken for CTA today and was found to have a saddle pulmonary embolism extending into segmental branches of all lobes bilaterally. Pharmacy was consulted to initiate patient on a heparin  infusion.  Baseline labs: INR and aPTT ordered Hgb 7.8 and PLT 140  this morning- low but stable No signs/symptoms of bleeding noted in chart  Goal of Therapy:  Heparin  level 0.3-0.7 units/ml Monitor platelets by anticoagulation protocol: Yes   Plan:  11/18:  HL @ 0429 = 0.40, therapeutic X 1 - Will continue pt on current rate and recheck HL in 8 hrs - Monitor CBC daily while on heparin    Leveon Pelzer D, PharmD, BCPS 07/30/2024,5:24 AM

## 2024-07-30 NOTE — Plan of Care (Signed)
  Problem: Clinical Measurements: Goal: Ability to maintain clinical measurements within normal limits will improve Outcome: Progressing Goal: Will remain free from infection Outcome: Progressing Goal: Cardiovascular complication will be avoided Outcome: Progressing   Problem: Coping: Goal: Level of anxiety will decrease Outcome: Progressing   Problem: Elimination: Goal: Will not experience complications related to urinary retention Outcome: Progressing

## 2024-07-30 NOTE — Progress Notes (Signed)
 [facility] CLINIC CARDIOLOGY PROGRESS NOTE       Patient ID: Rose Perry MRN: [id number] DOB/AGE: Dec 11, 1945 78 y.o.  Admit date: 07/25/2024 Referring Physician Robet Kim, PA - PCCM Primary Physician Marikay Eva POUR, PA  Primary Cardiologist None Reason for Consultation Decompensated respiratory status, elevated troponin  HPI: Rose Perry is a 78 y.o. female  with a past medical history of peripheral vascular disease, hypertension, type II diabetes who presented to the ED on 07/25/2024 for abdominal pain, found to have ruptured aortic aneurysm. Underwent repair on 07/25/2024. Over the weekend into this AM she developed worsening respiratory status. Troponin noted to be elevated. Cardiology was consulted for further evaluation.   Interval history: -Patient seen and examined this AM, resting comfortably in hospital bed on BiPAP. No family at bedside.  -Underwent pulmonary thrombectomy yesterday.  -BP and HR stable this AM.   Review of systems complete and found to be negative unless listed above    Past Medical History:  Diagnosis Date   Celiac artery stenosis    CME (cystoid macular edema)    right   CNVM (choroidal neovascular membrane)    Diabetes mellitus without complication (HCC)    Diverticulosis of colon    Elevated lipids    History of cataract    History of rectal bleeding    Hx of adenomatous colonic polyps    Hyperlipidemia    Hypertension    Hypertensive retinopathy of both eyes    Lymphedema    Primary open angle glaucoma (POAG) of both eyes     Past Surgical History:  Procedure Laterality Date   ABDOMINAL HYSTERECTOMY     COLONOSCOPY     COLONOSCOPY WITH PROPOFOL  N/A 08/22/2016   Procedure: COLONOSCOPY WITH PROPOFOL ;  Surgeon: Lamar ONEIDA Holmes, MD;  Location: [facility] ENDOSCOPY;  Service: Endoscopy;  Laterality: N/A;   COLONOSCOPY WITH PROPOFOL  N/A 08/11/2020   Procedure: COLONOSCOPY WITH PROPOFOL ;  Surgeon: Maryruth Ole ONEIDA, MD;  Location:  [facility] ENDOSCOPY;  Service: Endoscopy;  Laterality: N/A;   COLONOSCOPY WITH PROPOFOL  N/A 11/12/2021   Procedure: COLONOSCOPY WITH PROPOFOL ;  Surgeon: Maryruth Ole ONEIDA, MD;  Location: [facility] ENDOSCOPY;  Service: Endoscopy;  Laterality: N/A;  DM   COLONOSCOPY WITH PROPOFOL  N/A 08/18/2023   Procedure: COLONOSCOPY WITH PROPOFOL ;  Surgeon: Maryruth Ole ONEIDA, MD;  Location: [facility] ENDOSCOPY;  Service: Endoscopy;  Laterality: N/A;   ENDOVASCULAR STENT GRAFT (AAA) N/A 07/25/2024   Procedure: ENDOVASCULAR STENT GRAFT (AAA);  Surgeon: Jama Cordella [name], MD;  Location: [facility] INVASIVE CV LAB;  Service: Cardiovascular;  Laterality: N/A;  ruptured AAA   ENDOVASCULAR STENT GRAFT REPAIR  07/25/2024   Procedure: ENDOVASCULAR STENT GRAFT REPAIR;  Surgeon: Jama Cordella [name], MD;  Location: [facility] INVASIVE CV LAB;  Service: Cardiovascular;;   ESOPHAGOGASTRODUODENOSCOPY (EGD) WITH PROPOFOL  N/A 08/11/2020   Procedure: ESOPHAGOGASTRODUODENOSCOPY (EGD) WITH PROPOFOL ;  Surgeon: Maryruth Ole ONEIDA, MD;  Location: [facility] ENDOSCOPY;  Service: Endoscopy;  Laterality: N/A;   EYE SURGERY     JOINT REPLACEMENT Left    left total knee replacement   LOWER EXTREMITY ANGIOGRAPHY Right 02/20/2024   Procedure: Lower Extremity Angiography;  Surgeon: Jama Cordella [name], MD;  Location: [facility] INVASIVE CV LAB;  Service: Cardiovascular;  Laterality: Right;   ltkr     needle in foot removed     POLYPECTOMY  08/18/2023   Procedure: POLYPECTOMY;  Surgeon: Maryruth Ole ONEIDA, MD;  Location: [facility] ENDOSCOPY;  Service: Endoscopy;;   REFRACTIVE SURGERY      Medications Prior to  Admission  Medication Sig Dispense Refill Last Dose/Taking   amLODipine (NORVASC) 5 MG tablet Take 1 tablet by mouth daily.   07/24/2024   aspirin  EC 81 MG tablet Take 1 tablet (81 mg total) by mouth daily. Swallow whole. 150 tablet 1 07/24/2024   atorvastatin (LIPITOR) 20 MG tablet Take 1 tablet by mouth daily.   07/24/2024   clopidogrel  (PLAVIX ) 75 MG tablet Take 1  tablet (75 mg total) by mouth daily. 30 tablet 4 07/24/2024 Morning   metFORMIN (GLUCOPHAGE) 500 MG tablet Take 1 tablet by mouth 2 (two) times daily with a meal.   07/24/2024   metoprolol  tartrate (LOPRESSOR ) 25 MG tablet Take 0.5 tablets (12.5 mg total) by mouth 2 (two) times daily. 30 tablet 0 07/24/2024   pantoprazole  (PROTONIX ) 40 MG tablet Take 1 tablet (40 mg total) by mouth daily. 30 tablet 0 07/24/2024   polyethylene glycol (MIRALAX ) 17 g packet Take 17 g by mouth 2 (two) times daily. 60 each 0 Taking   potassium chloride  (KLOR-CON ) 10 MEQ tablet Take 10 mEq by mouth daily.   07/24/2024   timolol (TIMOPTIC) 0.5 % ophthalmic solution Place 1 drop into both eyes 2 (two) times daily.   07/24/2024   triamterene-hydrochlorothiazide (MAXZIDE-25) 37.5-25 MG tablet Take 1 tablet by mouth daily.   07/24/2024   dorzolamide-timolol (COSOPT) 2-0.5 % ophthalmic solution Place 1 drop into both eyes 2 (two) times daily.   Unknown   Multiple Vitamins-Minerals (SENTRY PO) Take by mouth daily.   Unknown   Social History   Socioeconomic History   Marital status: Divorced    Spouse name: Not on file   Number of children: Not on file   Years of education: Not on file   Highest education level: Not on file  Occupational History   Not on file  Tobacco Use   Smoking status: Former   Smokeless tobacco: Never  Vaping Use   Vaping status: Never Used  Substance and Sexual Activity   Alcohol use: No   Drug use: No   Sexual activity: Not Currently  Other Topics Concern   Not on file  Social History Narrative   There are several medical conditions patient states she has never had. This RN was deleting some when the patient asked me to stop so she could have list. The ones I deleted are AAA, arthritis, and anemia.   Social Drivers of Corporate Investment Banker Strain: Low Risk  (01/18/2023)   Received from [facility]   Overall Financial Resource Strain (CARDIA)    Difficulty of  Paying Living Expenses: Not hard at all  Food Insecurity: Patient Unable To Answer (07/25/2024)   Hunger Vital Sign    Worried About Programme Researcher, Broadcasting/film/video in the Last Year: Patient unable to answer    Ran Out of Food in the Last Year: Patient unable to answer  Transportation Needs: Patient Unable To Answer (07/25/2024)   PRAPARE - Transportation    Lack of Transportation (Medical): Patient unable to answer    Lack of Transportation (Non-Medical): Patient unable to answer  Physical Activity: Not on file  Stress: Not on file  Social Connections: Patient Unable To Answer (07/25/2024)   Social Connection and Isolation Panel    Frequency of Communication with Friends and Family: Patient unable to answer    Frequency of Social Gatherings with Friends and Family: Patient unable to answer    Attends Religious Services: Patient unable to answer    Active Member  of Clubs or Organizations: Patient unable to answer    Attends Club or Organization Meetings: Patient unable to answer    Marital Status: Patient unable to answer  Intimate Partner Violence: Patient Unable To Answer (07/25/2024)   Humiliation, Afraid, Rape, and Kick questionnaire    Fear of Current or Ex-Partner: Patient unable to answer    Emotionally Abused: Patient unable to answer    Physically Abused: Patient unable to answer    Sexually Abused: Patient unable to answer    Family History  Problem Relation Age of Onset   Hypertension Mother    Hyperlipidemia Mother    Hypertension Father    Coronary artery disease Brother    Breast cancer Neg Hx      Vitals:   07/30/24 0500 07/30/24 0600 07/30/24 0608 07/30/24 0623  BP: (!) 104/54 (!) 93/47 (!) 106/49 (!) 102/45  Pulse: 96 93 95 92  Resp: 17 17 18 17   Temp:   98.7 F (37.1 C) 98.5 F (36.9 C)  TempSrc:   Oral   SpO2: 92% 95% 96% 95%  Weight:      Height:        PHYSICAL EXAM General: Ill appearing female, well nourished, in no acute distress. HEENT: Normocephalic  and atraumatic. Neck: No JVD.  Lungs: Normal respiratory effort on BiPAP.  Heart: HRRR, borderline rate. Normal S1 and S2 without gallops or murmurs.  Abdomen: Non-distended appearing.  Msk: Normal strength and tone for age. Extremities: Warm and well perfused. No clubbing, cyanosis. No edema.  Neuro: Alert and oriented X 3. Psych: Answers questions appropriately.   Labs: Basic Metabolic Panel: Recent Labs    07/29/24 2019 07/30/24 0429  NA 140 140  K 3.3* 3.7  CL 102 104  CO2 29 28  GLUCOSE 154* 132*  BUN 25* 26*  CREATININE 1.30* 1.29*  CALCIUM 8.4* 8.5*  MG 2.1 2.1  PHOS 2.7 2.8   Liver Function Tests: Recent Labs    07/29/24 0531 07/29/24 2019  AST 102* 99*  ALT 17 27  ALKPHOS 54 60  BILITOT 0.7 0.6  PROT 5.7* 5.4*  ALBUMIN 3.4* 3.1*   No results for input(s): LIPASE, AMYLASE in the last 72 hours. CBC: Recent Labs    07/29/24 2019 07/30/24 0429  WBC 7.4 8.3  HGB 7.1* 6.9*  HCT 22.6* 22.2*  MCV 87.9 89.2  PLT 135* 138*   Cardiac Enzymes: No results for input(s): CKTOTAL, CKMB, CKMBINDEX, TROPONINIHS in the last 72 hours. BNP: No results for input(s): BNP in the last 72 hours. D-Dimer: No results for input(s): DDIMER in the last 72 hours. Hemoglobin A1C: No results for input(s): HGBA1C in the last 72 hours. Fasting Lipid Panel: No results for input(s): CHOL, HDL, LDLCALC, TRIG, CHOLHDL, LDLDIRECT in the last 72 hours. Thyroid Function Tests: No results for input(s): TSH, T4TOTAL, T3FREE, THYROIDAB in the last 72 hours.  Invalid input(s): FREET3 Anemia Panel: No results for input(s): VITAMINB12, FOLATE, FERRITIN, TIBC, IRON , RETICCTPCT in the last 72 hours.   Radiology: PERIPHERAL VASCULAR CATHETERIZATION Result Date: 07/29/2024 See surgical note for result.  CARDIAC CATHETERIZATION Result Date: 07/29/2024 See surgical note for result.  CT Angio Chest Pulmonary Embolism (PE) W or WO  Contrast Result Date: 07/29/2024 CLINICAL DATA:  Acute respiratory failure EXAM: CT ANGIOGRAPHY CHEST WITH CONTRAST TECHNIQUE: Multidetector CT imaging of the chest was performed using the standard protocol during bolus administration of intravenous contrast. Multiplanar CT image reconstructions and MIPs were obtained to evaluate the vascular anatomy.  RADIATION DOSE REDUCTION: This exam was performed according to the departmental dose-optimization program which includes automated exposure control, adjustment of the mA and/or kV according to patient size and/or use of iterative reconstruction technique. CONTRAST:  75mL OMNIPAQUE  IOHEXOL  350 MG/ML SOLN COMPARISON:  CT chest 07/28/2024. FINDINGS: Cardiovascular: There is adequate opacification of the pulmonary arteries. A saddle pulmonary embolism is identified extending into segmental branches of all lobes bilaterally. The heart is moderately enlarged. There is a small pericardial effusion. Aorta is normal in size. There are atherosclerotic calcifications of the aorta. Mediastinum/Nodes: No enlarged mediastinal, hilar, or axillary lymph nodes. Thyroid gland, trachea, and esophagus demonstrate no significant findings. Lungs/Pleura: Mild emphysema present. There is a stable 1 cm left upper lobe pulmonary nodule. Trace left and small right pleural effusions are present. There is right lower lobe airspace consolidation and collapse. There is no pneumothorax. Upper Abdomen: There is moderate ascites in the upper abdomen. Musculoskeletal: No chest wall abnormality. No acute or significant osseous findings. Review of the MIP images confirms the above findings. IMPRESSION: 1. Saddle pulmonary embolism extending into segmental branches of all lobes bilaterally. Positive for acute PE with CTevidence of right heart strain (RV/LV Ratio = 1.2) consistent with at least submassive (intermediate risk) PE. The presence of right heart strain has been associated with an increased risk  of morbidity and mortality. 2. Moderate cardiomegaly. 3. Small pericardial effusion. 4. Trace left and small right pleural effusions. 5. Right lower lobe airspace consolidation and collapse. 6. Moderate ascites in the upper abdomen. Aortic Atherosclerosis (ICD10-I70.0). Electronically Signed   By: Greig Pique M.D.   On: 07/29/2024 15:58   DG Chest Port 1 View Result Date: 07/29/2024 CLINICAL DATA:  Dyspnea. EXAM: PORTABLE CHEST 1 VIEW COMPARISON:  07/28/2024.  Chest CT dated 07/28/2024. FINDINGS: Poor inspiration. Stable mildly enlarged cardiac silhouette. Mildly increased patchy density at the right lung base. Minimally improved left lower lobe atelectasis. Thoracic spine degenerative changes. IMPRESSION: 1. Mildly increased patchy atelectasis or pneumonia at the right lung base. 2. Minimally improved left lower lobe atelectasis. 3. Stable mild cardiomegaly. Electronically Signed   By: Elspeth Bathe M.D.   On: 07/29/2024 14:20   ECHOCARDIOGRAM COMPLETE Result Date: 07/29/2024    ECHOCARDIOGRAM REPORT   Patient Name:   CLEASTER SHIFFER Date of Exam: 07/29/2024 Medical Rec #:  [id number]      Height:       67.0 in Accession #:    [id number]     Weight:       236.3 lb Date of Birth:  01-21-1946     BSA:          2.171 m Patient Age:    78 years       BP:           116/59 mmHg Patient Gender: F              HR:           103 bpm. Exam Location:  [facility] Procedure: 2D Echo, Cardiac Doppler and Color Doppler (Both Spectral and Color            Flow Doppler were utilized during procedure). Indications:     Dyspnea R06.00  History:         Patient has no prior history of Echocardiogram examinations.                  Risk Factors:Diabetes and Hypertension.  Sonographer:     Christopher Furnace Referring Phys:  [id number] MAGDALENE S TUKOV-YUAL Diagnosing Phys: Cara JONETTA Lovelace MD IMPRESSIONS  1. Left ventricular ejection fraction, by estimation, is 65 to 70%. The left ventricle has normal function. The left ventricle has no  regional wall motion abnormalities. There is moderate asymmetric left ventricular hypertrophy. Left ventricular diastolic parameters are consistent with Grade I diastolic dysfunction (impaired relaxation).  2. Right ventricular systolic function is mildly reduced. The right ventricular size is moderately enlarged.  3. Left atrial size was mildly dilated.  4. Right atrial size was mildly dilated.  5. The mitral valve is normal in structure. Trivial mitral valve regurgitation.  6. The aortic valve is normal in structure. Aortic valve regurgitation is trivial. Aortic valve sclerosis/calcification is present, without any evidence of aortic stenosis. FINDINGS  Left Ventricle: Left ventricular ejection fraction, by estimation, is 65 to 70%. The left ventricle has normal function. The left ventricle has no regional wall motion abnormalities. Strain was performed and the global longitudinal strain is indeterminate. The left ventricular internal cavity size was normal in size. There is moderate asymmetric left ventricular hypertrophy. Left ventricular diastolic parameters are consistent with Grade I diastolic dysfunction (impaired relaxation). Right Ventricle: The right ventricular size is moderately enlarged. No increase in right ventricular wall thickness. Right ventricular systolic function is mildly reduced. Left Atrium: Left atrial size was mildly dilated. Right Atrium: Right atrial size was mildly dilated. Pericardium: There is no evidence of pericardial effusion. Mitral Valve: The mitral valve is normal in structure. Trivial mitral valve regurgitation. MV peak gradient, 5.3 mmHg. The mean mitral valve gradient is 2.0 mmHg. Tricuspid Valve: The tricuspid valve is normal in structure. Tricuspid valve regurgitation is trivial. Aortic Valve: The aortic valve is normal in structure. Aortic valve regurgitation is trivial. Aortic valve sclerosis/calcification is present, without any evidence of aortic stenosis. Aortic valve  mean gradient measures 4.0 mmHg. Aortic valve peak gradient measures 8.3 mmHg. Aortic valve area, by VTI measures 2.62 cm. Pulmonic Valve: The pulmonic valve was normal in structure. Pulmonic valve regurgitation is not visualized. Aorta: The ascending aorta was not well visualized. IAS/Shunts: No atrial level shunt detected by color flow Doppler. Additional Comments: 3D was performed not requiring image post processing on an independent workstation and was indeterminate.  LEFT VENTRICLE PLAX 2D LVIDd:         4.70 cm   Diastology LVIDs:         2.70 cm   LV e' medial:    5.33 cm/s LV PW:         1.00 cm   LV E/e' medial:  10.3 LV IVS:        1.40 cm   LV e' lateral:   7.07 cm/s LVOT diam:     2.10 cm   LV E/e' lateral: 7.8 LV SV:         48 LV SV Index:   22 LVOT Area:     3.46 cm LV IVRT:       77 msec  RIGHT VENTRICLE RV Basal diam:  4.20 cm     PULMONARY VEINS RV Mid diam:    3.60 cm     Diastolic Velocity: 17.00 cm/s RV S prime:     13.40 cm/s  S/D Velocity:       2.60 TAPSE (M-mode): 1.8 cm      Systolic Velocity:  43.40 cm/s LEFT ATRIUM           Index        RIGHT ATRIUM  Index LA diam:      4.00 cm 1.84 cm/m   RA Area:     16.80 cm LA Vol (A2C): 31.4 ml 14.46 ml/m  RA Volume:   45.30 ml  20.87 ml/m LA Vol (A4C): 88.4 ml 40.72 ml/m  AORTIC VALVE AV Area (Vmax):    2.50 cm AV Area (Vmean):   2.35 cm AV Area (VTI):     2.62 cm AV Vmax:           144.00 cm/s AV Vmean:          96.100 cm/s AV VTI:            0.184 m AV Peak Grad:      8.3 mmHg AV Mean Grad:      4.0 mmHg LVOT Vmax:         104.00 cm/s LVOT Vmean:        65.200 cm/s LVOT VTI:          0.139 m LVOT/AV VTI ratio: 0.76  AORTA Ao Root diam: 3.20 cm MITRAL VALVE               TRICUSPID VALVE MV Area (PHT): 3.76 cm    TR Peak grad:   44.6 mmHg MV Area VTI:   2.56 cm    TR Vmax:        334.00 cm/s MV Peak grad:  5.3 mmHg MV Mean grad:  2.0 mmHg    SHUNTS MV Vmax:       1.15 m/s    Systemic VTI:  0.14 m MV Vmean:      69.7 cm/s    Systemic Diam: 2.10 cm MV Decel Time: 202 msec MV E velocity: 55.00 cm/s MV A velocity: 96.40 cm/s MV E/A ratio:  0.57 Dwayne D Callwood MD Electronically signed by Cara JONETTA Lovelace MD Signature Date/Time: 07/29/2024/1:48:43 PM    Final    CT CHEST WO CONTRAST Result Date: 07/28/2024 EXAM: CT CHEST WITHOUT CONTRAST 07/28/2024 06:19:00 PM TECHNIQUE: CT of the chest was performed without the administration of intravenous contrast. Multiplanar reformatted images are provided for review. Automated exposure control, iterative reconstruction, and/or weight based adjustment of the mA/kV was utilized to reduce the radiation dose to as low as reasonably achievable. COMPARISON: None available. CLINICAL HISTORY: Pneumonia, complication suspected, xray done; Respiratory illness, nondiagnostic xray. FINDINGS: MEDIASTINUM: Small pericardial effusion. Atheromatous calcifications of aorta and coronary arteries. The central airways are clear. LYMPH NODES: No mediastinal, hilar or axillary lymphadenopathy. LUNGS AND PLEURA: Irregularly marginated 1.3 cm pleural-based left apical nodule. Atelectasis or dense alveolar consolidation of the right lower lobe. Small right-sided pleural effusion. No pneumothorax. SOFT TISSUES/BONES: Thoracic degenerative disc disease. No acute abnormality of the soft tissues. UPPER ABDOMEN: Limited images of the upper abdomen demonstrate ascites. IMPRESSION: 1. Atelectasis or dense alveolar consolidation of the right lower lobe 2. Irregularly marginated 1.3 cm (13 mm) pleural-based left apical pulmonary nodule; per Fleischner Society Guidelines for a single solid nodule 8.120 mm, recommend further evaluation with non-contrast chest CT at 3 months, PET/CT, or tissue sampling depending on clinical risk and morphology 3. Small right-sided pleural effusion 4. Small pericardial effusion Electronically signed by: Fonda Field MD 07/28/2024 06:27 PM EST RP Workstation: [id number]   DG Chest 1  View Result Date: 07/28/2024 CLINICAL DATA:  Shortness of breath. Abdominal aortic aneurysm rupture and repair. EXAM: CHEST  1 VIEW COMPARISON:  07/27/2024. FINDINGS: The heart size and mediastinal contours are stable. There is atherosclerotic calcification aorta. Lung volumes  are low. Hazy opacity is noted at the right lung base. There is mild atelectasis at the left lung base. No pneumothorax is seen. No acute osseous abnormality. IMPRESSION: 1. Hazy opacity at the right lung base, possible atelectasis, infiltrate, and or pleural effusion. 2. Mild atelectasis at the left lung base. Electronically Signed   By: Leita Birmingham M.D.   On: 07/28/2024 14:52   DG Chest Port 1 View Result Date: 07/27/2024 CLINICAL DATA:  Respiratory failure with hypoxia. EXAM: PORTABLE CHEST 1 VIEW COMPARISON:  07/26/2024 FINDINGS: Lungs are hypoinflated with mild hazy bibasilar opacification likely due to atelectasis and possible small amount of bilateral pleural fluid. Infection in the lung bases is possible. Borderline stable cardiomegaly. Remainder of the exam is unchanged. IMPRESSION: Hypoinflation with mild hazy bibasilar opacification likely due to atelectasis and possible small amount of bilateral pleural fluid. Infection in the lung bases is possible. Electronically Signed   By: Toribio Agreste M.D.   On: 07/27/2024 11:15   DG Chest Port 1 View Result Date: 07/26/2024 CLINICAL DATA:  Respiratory with hypoxia. EXAM: PORTABLE CHEST 1 VIEW COMPARISON:  Radiographs 07/25/2024.  Abdominal CT 07/25/2024. FINDINGS: 0813 hours. Tip of the endotracheal tube is 5.7 cm above the carina. Enteric tube has been advanced with the side hole below the gastroesophageal junction. Right IJ central venous catheter projects to the inferior right atrium. Persistent low lung volumes with patchy opacities at both lung bases, likely atelectasis. Stable mild blunting of the left costophrenic angle without significant pleural effusion on recent CT.  Stable cardiomegaly and mediastinal contours. IMPRESSION: 1. Interval advancement of enteric tube with side hole below the gastroesophageal junction. 2. No other significant changes. Persistent low lung volumes with bibasilar atelectasis. Electronically Signed   By: Elsie Perone M.D.   On: 07/26/2024 12:05   PERIPHERAL VASCULAR CATHETERIZATION Result Date: 07/25/2024 See surgical note for result.  DG Abd 1 View Result Date: 07/25/2024 CLINICAL DATA:  OG placement EXAM: ABDOMEN - 1 VIEW COMPARISON:  CT 07/25/2024 FINDINGS: Right-sided central venous catheter tip at the low right atrium. Enteric tube tip folded back upon itself in the region of the stomach. Nonobstructed gas pattern. Aortic stent graft is noted. IMPRESSION: Enteric tube tip folded back upon itself in the region of the stomach. Electronically Signed   By: Luke Bun M.D.   On: 07/25/2024 16:30   DG Chest Port 1 View Result Date: 07/25/2024 CLINICAL DATA:  Intubated central line EXAM: PORTABLE CHEST 1 VIEW COMPARISON:  None Available. FINDINGS: Endotracheal tube tip is about 4.5 cm superior to the carina. Enteric tube tip at the level of distal esophagus, side-port in the region of mid esophagus. Right IJ central venous catheter tip at the low right atrium. Hypoventilatory changes. Mild cardiomegaly. Possible small left effusion. Suspect patchy atelectasis at the left base. IMPRESSION: 1. Endotracheal tube tip about 4.5 cm superior to the carina. 2. Enteric tube tip at the level of distal esophagus, side-port in the region of mid esophagus. Recommend advancement. See separately dictated abdominal radiograph 3. Right IJ central venous catheter tip at the low right atrium. 4. Hypoventilatory changes with possible small left effusion and patchy atelectasis at the left base. Electronically Signed   By: Luke Bun M.D.   On: 07/25/2024 16:29   CT ABDOMEN PELVIS W CONTRAST Result Date: 07/25/2024 CLINICAL DATA:  Acute right flank pain  with associated vomiting. History of abdominal aortic aneurysm measuring approximately 4.5 x 4.9 cm in dimensions by CTA in 2021. EXAM: CT  ABDOMEN AND PELVIS WITH CONTRAST TECHNIQUE: Multidetector CT imaging of the abdomen and pelvis was performed using the standard protocol following bolus administration of intravenous contrast. RADIATION DOSE REDUCTION: This exam was performed according to the departmental dose-optimization program which includes automated exposure control, adjustment of the mA and/or kV according to patient size and/or use of iterative reconstruction technique. CONTRAST:  OMNIPAQUE  IOHEXOL  300 MG/ML  SOLN COMPARISON:  CTA abdomen pelvis 08/10/2020 FINDINGS: Lower chest: No acute abnormality. Hepatobiliary: No focal liver abnormality is seen. No gallstones, gallbladder wall thickening, or biliary dilatation. Pancreas: Unremarkable. No pancreatic ductal dilatation or surrounding inflammatory changes. Spleen: Normal in size without focal abnormality. Adrenals/Urinary Tract: Adrenal glands are unremarkable. Kidneys are normal, without renal calculi, focal lesion, or hydronephrosis. Bladder is unremarkable. Stomach/Bowel: Bowel shows no evidence of obstruction, ileus, inflammation or lesion. The appendix is normal. No free intraperitoneal air. Vascular/Lymphatic: No lymphadenopathy. Significant interval enlargement infrarenal abdominal aortic aneurysm now measuring approximately 5.7 x 6.9 cm in maximum transverse dimensions with maximum oblique diameter of 7.0 cm. There is evidence to suggest acute focal leak from the right anterior aspect of the aneurysm with retroperitoneal stranding anterior to the aneurysm and to the right of the aneurysm also abutting the posterior duodenum and anterior aspect of the IVC. No dissection. Mural thrombus present in the aneurysm sac. Interval enlargement proximal right common iliac artery aneurysm now measuring up to 3.5 cm. Reproductive: Status post  hysterectomy. No adnexal masses. Other: Umbilical hernia containing fat. Musculoskeletal: No acute or significant osseous findings. IMPRESSION: 1. Interval enlargement of infrarenal abdominal aortic aneurysm now measuring up to 7 cm in greatest diameter. Evidence of acute focal retroperitoneal stranding anterior and to the right of the aneurysm consistent with focal hemorrhage/leak from the aneurysm. 2. Interval enlargement of right common iliac artery aneurysm now measuring up to 3.5 cm. 3. These results were called to the ordering clinician emergently by the CT Department and the patient will be transported to the Emergency Department. Electronically Signed   By: Marcey Moan M.D.   On: 07/25/2024 12:06    ECHO as above  TELEMETRY (personally reviewed): sinus rhythm PACs rate 100s  EKG (personally reviewed): sinus tachycardia rate 114 bpm  Data reviewed by me 07/30/2024: last 24h vitals tele labs imaging I/O ED provider note, admission H&P, PCCM notes, vascular surgery notes  Principal Problem:   AAA (abdominal aortic aneurysm, ruptured) (HCC) Active Problems:   Hemorrhagic shock (HCC)   Acute respiratory failure with hypoxia (HCC)   Atrial fibrillation with rapid ventricular response (HCC)    ASSESSMENT AND PLAN:  Rose Perry is a 78 y.o. female  with a past medical history of peripheral vascular disease, hypertension, type II diabetes who presented to the ED on 07/25/2024 for abdominal pain, found to have ruptured aortic aneurysm. Underwent repair on 07/25/2024. Over the weekend into this AM she developed worsening respiratory status. Troponin noted to be elevated. Cardiology was consulted for further evaluation.   # Elevated troponin, possibly demand # Acute hypoxic respiratory failure # Saddle pulmonary embolus # AAA with rupture s/p repair 07/25/24 # Paroxysmal atrial fibrillation # Hypertension Patient presented with abdominal pain, found to have ruptured AAA and underwent  repair on 07/25/24. Yesterday developed worsening SOB and required BiPAP. Also with tachycardia - AF RVR and started on IV amiodarone. Troponins today found to be elevated at 2159 from 770 yesterday. CXR with patchy atelectasis. Net positive 7.4L since admit. Echo this admission with EF 65-70%, no WMAs, moderate  asymmetric LVH, grade I diastolic dysfunction, moderate RV enlargement with mildly reduced function, trivial MR. CTA PE yesterday with saddle PE - underwent thrombectomy yesterday evening.  - This AM receiving IV heparin  and blood transfusion. - Will give IV lasix  40 mg today and assess response.  - Suspect troponin elevation secondary to demand given her saddle PE, decompensated respiratory status, recent surgery, blood loss.  Echo overall reassuring. - Continue IV amiodarone.  - Further management per PCCM, vascular surgery.   This patient's plan of care was discussed and created with Dr. Florencio and he is in agreement.  Signed: Danita Bloch, PA-C  07/30/2024, 8:27 AM [facility]

## 2024-07-30 NOTE — TOC Initial Note (Signed)
 Transition of Care [facility]) - Initial/Assessment Note    Patient Details  Name: Rose Perry MRN: [id number] Date of Birth: 10-14-1945  [facility] CM/SW Contact:    Corrie JINNY Ruts, LCSW Phone Number: 07/30/2024, 2:28 PM  Clinical Narrative:                 Chart reviewed. The patient was admitted for AAA. I was able to speak to the patient a bedside today. The patient family was at bedside today. The patient was doing ultra sound but was able to answer some questions. The patient sister was also able to provide answers as well.   I introduced myself, my role, and reason for consult. The patient reports that she has a PCP. The patient sister reports that the patient lives with her son. The patient sister reports that the patient was able to complete daily living task independently. The patient sister reports that she would drive the patient to medical appointments. The patient sister reports that she will assist the patient at D/C. [facility] The patient reports that she has never had HH or been admitted into a SNF in the past.   The patient sister reports that the patient has a cane in the home. I discussed the recommendation of HH with the patient and the patient sister. I provided the patient sister with a list of HH agencies. The sister verbalized understanding.     Barriers to Discharge: Continued Medical Work up   Patient Goals and CMS Choice            Expected Discharge Plan and Services                                              Prior Living Arrangements/Services   Lives with:: Adult Children (per patient sister the patient lives with her son)          Need for Family Participation in Patient Care: Yes (Comment)     Criminal Activity/Legal Involvement Pertinent to Current Situation/Hospitalization: No - Comment as needed  Activities of Daily Living      Permission Sought/Granted                   Emotional Assessment Appearance:: Appears stated age Attitude/Demeanor/Rapport: Gracious Affect (typically observed): Calm Orientation: : Oriented to Self, Oriented to Place, Oriented to  Time, Oriented to Situation Alcohol / Substance Use: Not Applicable    Admission diagnosis:  AAA (abdominal aortic aneurysm, ruptured) (HCC) [I71.30] Patient Active Problem List   Diagnosis Date Noted   Acute saddle pulmonary embolism with acute cor pulmonale (HCC) 07/30/2024   Atrial fibrillation with rapid ventricular response (HCC) 07/27/2024   AAA (abdominal aortic aneurysm, ruptured) (HCC) 07/25/2024   Hemorrhagic shock (HCC) 07/25/2024   Acute respiratory failure with hypoxia (HCC) 07/25/2024   Leg pain, diffuse, right 05/24/2021   Arthritis 11/23/2020   Celiac artery stenosis 11/22/2020   Chronic venous insufficiency 11/22/2020   Lymphedema 11/22/2020   Hyperlipidemia 11/22/2020   Dehydration    Lower GI bleed    Abdominal aortic aneurysm (AAA) without rupture    Acute blood loss anemia 08/09/2020   Hypertension    Hypokalemia    Rectal bleed    Primary open angle glaucoma (POAG) of both eyes, moderate stage 08/02/2019   CME (cystoid macular  edema), right 11/02/2018   CNVM (choroidal neovascular membrane), right 11/02/2018   Hypertensive retinopathy of both eyes 11/02/2018   Type 2 diabetes mellitus with left 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 without macular edema, without long-term current use of insulin  (HCC) 02/18/2015   PCP:  Marikay Eva POUR, PA Pharmacy:   [facility] [address], [place] - [address] [place] [address] Phone: [phone] Fax: [phone]     Social Drivers of Health (SDOH) Social History: SDOH Screenings   Food Insecurity: Patient Unable To Answer (07/25/2024)  Housing: Patient Unable To Answer (07/25/2024)  Transportation Needs: Patient Unable To Answer (07/25/2024)  Utilities: Patient Unable To  Answer (07/25/2024)  Financial Resource Strain: Low Risk  (01/18/2023)   Received from [facility]  Social Connections: Patient Unable To Answer (07/25/2024)  Tobacco Use: Medium Risk (07/25/2024)   Received from [facility]   SDOH Interventions: Food Insecurity Interventions: Patient Unable to Answer Housing Interventions: Patient Unable to Answer Transportation Interventions: Patient Unable to Answer Utilities Interventions: Patient Unable to Answer Social Connections Interventions: Patient Unable to Answer   Readmission Risk Interventions     No data to display

## 2024-07-31 ENCOUNTER — Inpatient Hospital Stay

## 2024-07-31 DIAGNOSIS — R0602 Shortness of breath: Secondary | ICD-10-CM | POA: Diagnosis not present

## 2024-07-31 DIAGNOSIS — J9601 Acute respiratory failure with hypoxia: Secondary | ICD-10-CM | POA: Diagnosis not present

## 2024-07-31 DIAGNOSIS — R571 Hypovolemic shock: Secondary | ICD-10-CM | POA: Diagnosis not present

## 2024-07-31 DIAGNOSIS — R918 Other nonspecific abnormal finding of lung field: Secondary | ICD-10-CM | POA: Diagnosis not present

## 2024-07-31 DIAGNOSIS — E8721 Acute metabolic acidosis: Secondary | ICD-10-CM | POA: Diagnosis not present

## 2024-07-31 DIAGNOSIS — I713 Abdominal aortic aneurysm, ruptured, unspecified: Secondary | ICD-10-CM | POA: Diagnosis not present

## 2024-07-31 DIAGNOSIS — I517 Cardiomegaly: Secondary | ICD-10-CM | POA: Diagnosis not present

## 2024-07-31 DIAGNOSIS — I700 Atherosclerosis of aorta: Secondary | ICD-10-CM | POA: Diagnosis not present

## 2024-07-31 LAB — TYPE AND SCREEN
ABO/RH(D): B POS
Antibody Screen: NEGATIVE
Unit division: 0

## 2024-07-31 LAB — BASIC METABOLIC PANEL WITH GFR
Anion gap: 8 (ref 5–15)
BUN: 30 mg/dL — ABNORMAL HIGH (ref 8–23)
CO2: 28 mmol/L (ref 22–32)
Calcium: 8.5 mg/dL — ABNORMAL LOW (ref 8.9–10.3)
Chloride: 104 mmol/L (ref 98–111)
Creatinine, Ser: 1.31 mg/dL — ABNORMAL HIGH (ref 0.44–1.00)
GFR, Estimated: 41 mL/min — ABNORMAL LOW (ref >60)
Glucose, Bld: 120 mg/dL — ABNORMAL HIGH (ref 70–99)
Potassium: 3.3 mmol/L — ABNORMAL LOW (ref 3.5–5.1)
Sodium: 140 mmol/L (ref 135–145)

## 2024-07-31 LAB — GLUCOSE, CAPILLARY
Glucose-Capillary: 115 mg/dL — ABNORMAL HIGH (ref 70–99)
Glucose-Capillary: 151 mg/dL — ABNORMAL HIGH (ref 70–99)
Glucose-Capillary: 129 mg/dL — ABNORMAL HIGH (ref 70–99)
Glucose-Capillary: 133 mg/dL — ABNORMAL HIGH (ref 70–99)
Glucose-Capillary: 122 mg/dL — ABNORMAL HIGH (ref 70–99)

## 2024-07-31 LAB — CBC
HCT: 24.5 % — ABNORMAL LOW (ref 36.0–46.0)
Hemoglobin: 7.9 g/dL — ABNORMAL LOW (ref 12.0–15.0)
MCH: 28.6 pg (ref 26.0–34.0)
MCHC: 32.2 g/dL (ref 30.0–36.0)
MCV: 88.8 fL (ref 80.0–100.0)
Platelets: 150 K/uL (ref 150–400)
RBC: 2.76 MIL/uL — ABNORMAL LOW (ref 3.87–5.11)
RDW: 16.5 % — ABNORMAL HIGH (ref 11.5–15.5)
WBC: 11.8 K/uL — ABNORMAL HIGH (ref 4.0–10.5)
nRBC: 1.4 % — ABNORMAL HIGH (ref 0.0–0.2)

## 2024-07-31 LAB — BPAM RBC
Blood Product Expiration Date: 202512122359
ISSUE DATE / TIME: 202511180548
Unit Type and Rh: 5100

## 2024-07-31 LAB — PRO BRAIN NATRIURETIC PEPTIDE: Pro Brain Natriuretic Peptide: 5122 pg/mL — ABNORMAL HIGH (ref <300.0)

## 2024-07-31 LAB — PHOSPHORUS: Phosphorus: 2.5 mg/dL (ref 2.5–4.6)

## 2024-07-31 LAB — POTASSIUM: Potassium: 3.9 mmol/L (ref 3.5–5.1)

## 2024-07-31 LAB — HEPARIN LEVEL (UNFRACTIONATED): Heparin Unfractionated: 0.42 [IU]/mL (ref 0.30–0.70)

## 2024-07-31 LAB — MAGNESIUM: Magnesium: 2.1 mg/dL (ref 1.7–2.4)

## 2024-07-31 MED ORDER — LEVALBUTEROL HCL 1.25 MG/0.5ML IN NEBU
1.2500 mg | INHALATION_SOLUTION | Freq: Three times a day (TID) | RESPIRATORY_TRACT | Status: DC
  Administered 2024-08-01 – 2024-08-03 (×7): 1.25 mg via RESPIRATORY_TRACT
  Filled 2024-07-31 (×9): qty 0.5

## 2024-07-31 MED ORDER — FUROSEMIDE 10 MG/ML IJ SOLN: 40.0000 mg | Freq: Once | INTRAMUSCULAR | Status: DC

## 2024-07-31 MED ORDER — PANTOPRAZOLE SODIUM 40 MG PO TBEC
40.0000 mg | DELAYED_RELEASE_TABLET | Freq: Every day | ORAL | Status: DC
  Administered 2024-07-31 – 2024-08-05 (×6): 40 mg via ORAL
  Filled 2024-07-31 (×6): qty 1

## 2024-07-31 MED ORDER — ENSURE PLUS HIGH PROTEIN PO LIQD
237.0000 mL | Freq: Three times a day (TID) | ORAL | Status: DC
  Administered 2024-07-31 – 2024-08-06 (×10): 237 mL via ORAL

## 2024-07-31 MED ORDER — CHLORHEXIDINE GLUCONATE CLOTH 2 % EX PADS
6.0000 | MEDICATED_PAD | Freq: Every day | CUTANEOUS | Status: DC
  Administered 2024-07-31 – 2024-08-05 (×5): 6 via TOPICAL

## 2024-07-31 MED ORDER — IPRATROPIUM BROMIDE 0.02 % IN SOLN
0.5000 mg | Freq: Three times a day (TID) | RESPIRATORY_TRACT | Status: DC
  Administered 2024-07-31 – 2024-08-03 (×8): 0.5 mg via RESPIRATORY_TRACT
  Filled 2024-07-31 (×9): qty 2.5

## 2024-07-31 MED ORDER — FUROSEMIDE 10 MG/ML IJ SOLN
40.0000 mg | Freq: Once | INTRAMUSCULAR | Status: AC
  Administered 2024-07-31: 40 mg via INTRAVENOUS
  Filled 2024-07-31: qty 4

## 2024-07-31 MED ORDER — POTASSIUM CHLORIDE 10 MEQ/50ML IV SOLN
10.0000 meq | INTRAVENOUS | Status: DC
  Filled 2024-07-31 (×4): qty 50

## 2024-07-31 MED ORDER — THIAMINE HCL 100 MG PO TABS
100.0000 mg | ORAL_TABLET | Freq: Every day | ORAL | Status: AC
Start: 2024-07-31 — End: 2024-08-06
  Administered 2024-07-31 – 2024-08-06 (×7): 100 mg via ORAL
  Filled 2024-07-31 (×12): qty 1

## 2024-07-31 MED ORDER — POTASSIUM CHLORIDE CRYS ER 20 MEQ PO TBCR
40.0000 meq | EXTENDED_RELEASE_TABLET | ORAL | Status: AC
  Administered 2024-07-31 (×2): 40 meq via ORAL
  Filled 2024-07-31 (×2): qty 2

## 2024-07-31 MED ORDER — ADULT MULTIVITAMIN W/MINERALS CH
1.0000 | ORAL_TABLET | Freq: Every day | ORAL | Status: DC
  Administered 2024-08-01 – 2024-08-06 (×6): 1 via ORAL
  Filled 2024-07-31 (×6): qty 1

## 2024-07-31 NOTE — Progress Notes (Signed)
 NAME:  Rose Perry, MRN:  [id number], DOB:  1946-01-25, LOS: 6 ADMISSION DATE:  07/25/2024, CONSULTATION DATE:  07/25/24 REFERRING MD:  Dr. Cordella Shawl, CHIEF COMPLAINT:  Abdominal pain   Brief Pt Description / Synopsis:  78 year old female with history of PAD, T2DM, and HTN who presented with abdominal pain found to have a ruptured abdominal aneurysm, admitted with hemorrhagic shock and taken emergency for AAA repair with vascular surgery. Course complicated by Acute Hypoxic Respiratory Failure due to saddle PE, pulmonary edema, and questionable pneumonia.   History of Present Illness:  [name] is a 78 y.o. female who presented to [facility] ED from [facility] walk-in clinic for worsening abdominal pain x 2 days, to which a STAT Abd/Pelvis CT was ordered, resulting with concern for possible ruptured aneurysm. The patient was transported to [facility] ED for further workup. She reported the abdominal pain has been present for 2 weeks but significantly increased over the past two days. Denied shortness of breath and chest pain.  ED Course: Vitals on arrival: Temp 97.8, BP 149/65, HR 74, RR 15, SpO2 95%  Medications Administered: Cefazolin  preoperatively Morphine  NS bolus Zofran  NS gtt  Pertinent Labs/Diagnostics: Initial CBC unremarkable BMP unremarkable Troponin: 20 Lipase 17 11/13 CT abdomen/pelvis: AAA measuring 7cm, as well as a right iliac aneurysm measuring 3.5cm.   Vascular surgery emergently consulted. Upon further evaluation they decided to take her to the vascular lab for AAA repair. While waiting to go to the OR, she developed severe hypotension (80/69) and tachycardia requiring initiating of norepinephrine, as well as IVF resuscitation. She was emergently taken to the vascular lab, with worsening hypotension requiring phenylephrine , epinephrine  and vasopressin. Massive Transfusion Protocol was initiated and she was mechanically intubated. A total of 6mg  Versed  was given,  3 grams on albumin, calcium chloride 0.25g, 6units pRBC, 2 units of FFP and 1 unit of platelets. Repeat Hgb was 6.9.   PCCM was consulted for ICU admission due to hypotension requiring vasopressors and mechanical ventilation.  Please see Significant Hospital Events section below for full detailed hospital course.   Pertinent  Medical History  Hypertension Type II Diabetes Mellitus Hyperlipidemia Celiac Artery Stenosis  Micro Data:  11/13: MRSA PCR>>negative 11/16: Blood cultures x2>> no growth to date  Antimicrobials:   Anti-infectives (From admission, onward)    Start     Dose/Rate Route Frequency Ordered Stop   07/30/24 0600  ceFAZolin  (ANCEF ) IVPB 2g/100 mL premix  Status:  Discontinued        2 g 200 mL/hr over 30 Minutes Intravenous On call to O.R. 07/29/24 1827 07/29/24 2005   07/29/24 2200  vancomycin (VANCOCIN) IVPB 1000 mg/200 mL premix  Status:  Discontinued        1,000 mg 200 mL/hr over 60 Minutes Intravenous Every 24 hours 07/28/24 1941 07/29/24 1537   07/29/24 1701  ceFAZolin  (ANCEF ) IVPB 2g/100 mL premix        2 g 200 mL/hr over 30 Minutes Intravenous 30 min pre-op 07/29/24 1701 07/29/24 2011   07/28/24 2200  piperacillin -tazobactam (ZOSYN ) IVPB 3.375 g        3.375 g 12.5 mL/hr over 240 Minutes Intravenous Every 8 hours 07/28/24 1902 08/05/24 0159   07/28/24 2000  vancomycin (VANCOREADY) IVPB 2000 mg/400 mL        2,000 mg 200 mL/hr over 120 Minutes Intravenous  Once 07/28/24 1902 07/28/24 2157   07/26/24 0600  ceFAZolin  (ANCEF ) IVPB 2g/100 mL premix  2 g 200 mL/hr over 30 Minutes Intravenous On call to O.R. 07/25/24 1257 07/26/24 0804   07/25/24 2100  ceFAZolin  (ANCEF ) IVPB 2g/100 mL premix        2 g 200 mL/hr over 30 Minutes Intravenous Every 8 hours 07/25/24 1530 07/26/24 0700      Significant Hospital Events: Including procedures, antibiotic start and stop dates in addition to other pertinent events   11/13: CT Abd/pelvis identified 7mm AAA  concern for rupture, pt decompensated and was emergently taken to the vascular lab. Placed on levo, later requiring vaso, neo and epi during the procedure. MTP initiated, repeat Hgb was 6.9 (previous 13.5). Admitted to the ICU for mechanical ventilation management and hypotension requiring vasopressors. Started on fentanyl  gtt.  11/14: No significant events overnight.  AFebrile, on low dose levophed.  Hgb remained stable overnight, on minimal vent support, WUA & SBT as tolerated.  Diurese with 40 mg IV Lasix  x1 dose, hopeful for extubation to BiPAP. 11/15: Weaned off Levophed overnight.  Developed tachycardic in afib, rate 140-150s, started on amiodarone gtt. 07/28/24: PCCM reconsulted for acute respiratory distress with hypoxemia; patient placed on BiPAP 07/29/24: Placed on HFNC from bipap, subsequent O2 requirements increased up to 10L, later placed back on bipap. Troponin 2159, ECHO shows EF 65-70% with moderate RV enlargement; cardio consulted. 07/30/24: S/P mechanical thrombectomy for saddle pulmonary embolism. Remains on bipap; attempted to wean FiO2 to 40% overnight but pt desatted to the 80's; placed back on 60%. Will try to wean requirements and transition to [facility] today.  07/31/24: No significant events noted overnight.  FiO2 requirements decreased to 35% on HHFNC, will trial HFNC.  Creatinine stable with diuresis yesterday, UOP 1.3 L last 24 hrs (net +7.3L).  Will give 40 mg IV Lasix  x1 dose.  Interim History / Subjective:  See above listed under Significant Hospital Events   Objective    Blood pressure (!) 115/50, pulse 87, temperature 98.3 F (36.8 C), temperature source Axillary, resp. rate (!) 22, height 5' 7 (1.702 m), weight 108.1 kg, SpO2 96%.    FiO2 (%):  [50 %-65 %] 50 % PEEP:  [5 cmH20] 5 cmH20 Pressure Support:  [5 cmH20] 5 cmH20   Intake/Output Summary (Last 24 hours) at 07/31/2024 0840 Last data filed at 07/31/2024 0600 Gross per 24 hour  Intake 1205.22 ml  Output  1175 ml  Net 30.22 ml   Filed Weights   07/25/24 1210 07/30/24 0448 07/31/24 0448  Weight: 107.2 kg 107.6 kg 108.1 kg    Examination: General: Acutely ill appearing female, laying in bed, on HHFNC, in NAD HENT: atraumatic, normocephalic, supple, no JVD Lungs: Coarse breaths sounds bilaterally, normal effort, non-labored Cardiovascular: Regular rate & rhythm, S1 S2, no m/r/g Abdomen: soft, mild distention, no ttp/rebound/guarding, bowel sounds x 4 Extremities: cool, dry, intact, radial pulses 2+, distal pulses 1+, trace edema Neuro: Awake and alert, oriented x3, moves all extremities to commands, no focal neuro deficits noted, PERRL GU: Foley catheter in place draining yellow urine   Resolved problem list   Assessment and Plan   #Acute Metabolic Encephalopathy ~ IMPROVING  -Treatment of metabolic derangements as outlined below -Provide supportive care -Promote normal sleep/wake cycle and family presence -Avoid sedating medications as able  #Hemorrhagic Shock s/t AAA Rupture s/p Stent Placement (11/13) ~ RESOLVED  #Atrial Fibrillation with RVR ~ RESOLVED  #Elevated Troponin in setting of NSTEMI vs Demand ischemia  Hx: Hypertension, HLD, Celiac Artery Stenosis 11/17 ECHO: EF 65-70% with moderately enlarged RV -  Continuous cardiac monitoring -Maintain MAP >65 -Transfusions as indicated -Vasopressors as needed to maintain MAP goal ~ weaned off -HS Troponin peaked at 2159 -Diuresis as BP and renal function permits ~ will give 40 mg IV Lasix  x1 dose on 11/19 -Cardiology following, appreciate input ~ continue Amiodarone & Heparin  gtt's  -Vascular Surgery following, appreciate input   #Mechanical Intubation for Airway Protection Intraoperatively & Metabolic Acidosis ~ EXTUBATED 88/85 #Acute Hypoxic Respiratory Failure #Symptomatic Pulmonary Emboli with RH Strain S/P Thrombectomy (07/29/24)  #Pulmonary Edema  #Questionable HCAP 11/16 Chest CT: atelectasis vs dense alveolar  consolidation of RLL;  pleural-based left apical pulmonary nodule; small right sided pleural effusion; small pericardial effusion 11/17 CTA Chest PE: Saddle pulmonary embolism extending into segmental branches of all lobes bilaterally. Positive for acute PE with CTevidence of right heart strain (RV/LV Ratio = 1.2) consistent with at least submassive (intermediate risk) PE. The presence of right heart strain has been associated with an increased risk of morbidity and mortality. Moderate cardiomegaly. Small pericardial effusion. Trace left and small right pleural effusions. Right lower lobe airspace consolidation and collapse. Moderate ascites in the upper abdomen. -Supplemental O2 as needed to maintain O2 sats >92% -BiPAP, wean as tolerated -Follow intermittent Chest X-ray & ABG as needed -Bronchodilators  -ABX as above -Diuresis as BP and renal function permits ~ give 40 mg IV Lasix  x1 dose on 11/19 -Continue Heparin  gtt  -Pulmonary toilet as able -ABX as above  #Questionable HCAP -Monitor fever curve -Trend WBC's  -Follow cultures as above -Continue empiric Zosyn  (plan for 7 day course) pending cultures & sensitivities  #Acute Kidney Injury #Metabolic Acidosis s/t Hemorrhagic Shock iso AAA Rupture ~ RESOLVED #Hypocalcemia -Monitor I&O's / urinary output -Follow BMP -Ensure adequate renal perfusion -Avoid nephrotoxic agents as able -Replace electrolytes as indicated ~ Pharmacy following for assistance with electrolyte replacement  #GI Prophylaxis - Continue Protonix  40mg  - Diet: NPO - Constipation protocol  #Acute Blood Loss Anemia s/t AAA Rupture -Monitor for S/Sx of bleeding -Trend CBC -SCD's for VTE Prophylaxis  -Transfuse for Hgb <7 -Transfuse Platelets for Platelet count < 10K; < 50K with active bleeding; < 100K with Neurosurgical procedures/processes  -DIC ruled out ~ smear negative for schistocytes   #Type II Diabetes Mellitus - ICU hypo/hyperglycemia protocol - SSI  as indicated: Novolog  - CBG Q4h     Labs   CBC: Recent Labs  Lab 07/29/24 0531 07/29/24 2019 07/30/24 0429 07/30/24 1134 07/31/24 0421  WBC 5.2 7.4 8.3 9.6 11.8*  HGB 7.8* 7.1* 6.9* 8.4* 7.9*  HCT 24.5* 22.6* 22.2* 26.1* 24.5*  MCV 88.8 87.9 89.2 88.2 88.8  PLT 140* 135* 138* 155 150    Basic Metabolic Panel: Recent Labs  Lab 07/27/24 1706 07/28/24 0321 07/28/24 1459 07/29/24 0531 07/29/24 2019 07/30/24 0429 07/31/24 0421  NA 142   < > 142 142 140 140 140  K 3.4*   < > 3.9 3.7 3.3* 3.7 3.3*  CL 107   < > 106 106 102 104 104  CO2 26   < > 25 27 29 28 28   GLUCOSE 128*   < > 150* 140* 154* 132* 120*  BUN 21   < > 22 25* 25* 26* 30*  CREATININE 1.25*   < > 1.18* 1.13* 1.30* 1.29* 1.31*  CALCIUM 8.4*   < > 8.6* 8.6* 8.4* 8.5* 8.5*  MG 1.8   < > 1.9 2.2 2.1 2.1 2.1  PHOS 2.5  --  2.5  --  2.7 2.8  2.5   < > = values in this interval not displayed.   GFR: Estimated Creatinine Clearance: 44.1 mL/min (A) (by C-G formula based on SCr of 1.31 mg/dL (H)). Recent Labs  Lab 07/25/24 2343 07/26/24 0415 07/28/24 1459 07/29/24 0531 07/29/24 1600 07/29/24 2019 07/29/24 2020 07/30/24 0429 07/30/24 1134 07/31/24 0421  WBC  --    < > 10.0   < >  --  7.4  --  8.3 9.6 11.8*  LATICACIDVEN 2.1*  --  1.0  --  1.9  --  1.0  --   --   --    < > = values in this interval not displayed.    Liver Function Tests: Recent Labs  Lab 07/25/24 1236 07/27/24 1706 07/29/24 0531 07/29/24 2019  AST 20  --  102* 99*  ALT 14  --  17 27  ALKPHOS 83  --  54 60  BILITOT 0.4  --  0.7 0.6  PROT 7.2  --  5.7* 5.4*  ALBUMIN 4.0 3.5 3.4* 3.1*   Recent Labs  Lab 07/25/24 1236  LIPASE 17   No results for input(s): AMMONIA in the last 168 hours.  ABG    Component Value Date/Time   PHART 7.41 07/28/2024 1419   PCO2ART 48 07/28/2024 1419   PO2ART 52 (L) 07/28/2024 1419   HCO3 30.4 (H) 07/29/2024 2043   ACIDBASEDEF 6.6 (H) 07/25/2024 1807   O2SAT 92.5 07/29/2024 2043      Coagulation Profile: Recent Labs  Lab 07/25/24 1419 07/25/24 2343 07/29/24 1741  INR 2.2* 1.1 1.1    Cardiac Enzymes: No results for input(s): CKTOTAL, CKMB, CKMBINDEX, TROPONINI in the last 168 hours.  HbA1C: Hgb A1c MFr Bld  Date/Time Value Ref Range Status  07/25/2024 12:36 PM 6.5 (H) 4.8 - 5.6 % Final    Comment:    (NOTE) Diagnosis of Diabetes The following HbA1c ranges recommended by the American Diabetes Association (ADA) may be used as an aid in the diagnosis of diabetes mellitus.  Hemoglobin             Suggested A1C NGSP%              Diagnosis  <5.7                   Non Diabetic  5.7-6.4                Pre-Diabetic  >6.4                   Diabetic  <7.0                   Glycemic control for                       adults with diabetes.    08/09/2020 09:19 AM 6.5 (H) 4.8 - 5.6 % Final    Comment:    (NOTE) Pre diabetes:          5.7%-6.4%  Diabetes:              >6.4%  Glycemic control for   <7.0% adults with diabetes     CBG: Recent Labs  Lab 07/30/24 0746 07/30/24 1106 07/30/24 1625 07/30/24 2128 07/31/24 0747  GLUCAP 127* 114* 124* 141* 115*    Review of Systems:   Positives in BOLD: Gen: Denies fever, chills, weight change, fatigue, night sweats HEENT: Denies blurred vision, double vision, hearing loss, tinnitus, sinus  congestion, rhinorrhea, sore throat, neck stiffness, dysphagia PULM: Denies shortness of breath (reports is improved), cough, sputum production, hemoptysis, wheezing CV: Denies chest pain, edema, orthopnea, paroxysmal nocturnal dyspnea, palpitations GI: Denies abdominal pain, nausea, vomiting, diarrhea, hematochezia, melena, constipation, change in bowel habits GU: Denies dysuria, hematuria, polyuria, oliguria, urethral discharge Endocrine: Denies hot or cold intolerance, polyuria, polyphagia or appetite change Derm: Denies rash, dry skin, scaling or peeling skin change Heme: Denies easy bruising, bleeding,  bleeding gums Neuro: Denies headache, numbness, weakness, slurred speech, loss of memory or consciousness    Past Medical History:  She,  has a past medical history of Celiac artery stenosis, CME (cystoid macular edema), CNVM (choroidal neovascular membrane), Diabetes mellitus without complication (HCC), Diverticulosis of colon, Elevated lipids, History of cataract, History of rectal bleeding, adenomatous colonic polyps, Hyperlipidemia, Hypertension, Hypertensive retinopathy of both eyes, Lymphedema, and Primary open angle glaucoma (POAG) of both eyes.   Surgical History:   Past Surgical History:  Procedure Laterality Date   ABDOMINAL HYSTERECTOMY     CENTRAL LINE INSERTION  07/29/2024   Procedure: CENTRAL LINE INSERTION;  Surgeon: Jama Cordella [name], MD;  Location: [facility] INVASIVE CV LAB;  Service: Cardiovascular;;   COLONOSCOPY     COLONOSCOPY WITH PROPOFOL  N/A 08/22/2016   Procedure: COLONOSCOPY WITH PROPOFOL ;  Surgeon: Lamar ONEIDA Holmes, MD;  Location: [facility] ENDOSCOPY;  Service: Endoscopy;  Laterality: N/A;   COLONOSCOPY WITH PROPOFOL  N/A 08/11/2020   Procedure: COLONOSCOPY WITH PROPOFOL ;  Surgeon: Maryruth Ole ONEIDA, MD;  Location: [facility] ENDOSCOPY;  Service: Endoscopy;  Laterality: N/A;   COLONOSCOPY WITH PROPOFOL  N/A 11/12/2021   Procedure: COLONOSCOPY WITH PROPOFOL ;  Surgeon: Maryruth Ole ONEIDA, MD;  Location: [facility] ENDOSCOPY;  Service: Endoscopy;  Laterality: N/A;  DM   COLONOSCOPY WITH PROPOFOL  N/A 08/18/2023   Procedure: COLONOSCOPY WITH PROPOFOL ;  Surgeon: Maryruth Ole ONEIDA, MD;  Location: [facility] ENDOSCOPY;  Service: Endoscopy;  Laterality: N/A;   ENDOVASCULAR STENT GRAFT (AAA) N/A 07/25/2024   Procedure: ENDOVASCULAR STENT GRAFT (AAA);  Surgeon: Jama Cordella [name], MD;  Location: [facility] INVASIVE CV LAB;  Service: Cardiovascular;  Laterality: N/A;  ruptured AAA   ENDOVASCULAR STENT GRAFT REPAIR  07/25/2024   Procedure: ENDOVASCULAR STENT GRAFT REPAIR;  Surgeon: Jama Cordella [name], MD;   Location: [facility] INVASIVE CV LAB;  Service: Cardiovascular;;   ESOPHAGOGASTRODUODENOSCOPY (EGD) WITH PROPOFOL  N/A 08/11/2020   Procedure: ESOPHAGOGASTRODUODENOSCOPY (EGD) WITH PROPOFOL ;  Surgeon: Maryruth Ole ONEIDA, MD;  Location: [facility] ENDOSCOPY;  Service: Endoscopy;  Laterality: N/A;   EYE SURGERY     IVC FILTER INSERTION N/A 07/29/2024   Procedure: IVC FILTER INSERTION;  Surgeon: Jama Cordella [name], MD;  Location: [facility] INVASIVE CV LAB;  Service: Cardiovascular;  Laterality: N/A;   JOINT REPLACEMENT Left    left total knee replacement   LOWER EXTREMITY ANGIOGRAPHY Right 02/20/2024   Procedure: Lower Extremity Angiography;  Surgeon: Jama Cordella [name], MD;  Location: [facility] INVASIVE CV LAB;  Service: Cardiovascular;  Laterality: Right;   ltkr     needle in foot removed     POLYPECTOMY  08/18/2023   Procedure: POLYPECTOMY;  Surgeon: Maryruth Ole ONEIDA, MD;  Location: [facility] ENDOSCOPY;  Service: Endoscopy;;   PULMONARY THROMBECTOMY Bilateral 07/29/2024   Procedure: PULMONARY THROMBECTOMY;  Surgeon: Jama Cordella [name], MD;  Location: [facility] INVASIVE CV LAB;  Service: Cardiovascular;  Laterality: Bilateral;   REFRACTIVE SURGERY       Social History:   reports that she has quit smoking. She has never used smokeless tobacco. She reports that she does not  drink alcohol and does not use drugs.   Family History:  Her family history includes Coronary artery disease in her brother; Hyperlipidemia in her mother; Hypertension in her father and mother. There is no history of Breast cancer.   Allergies Allergies  Allergen Reactions   Codeine      Home Medications  Prior to Admission medications   Medication Sig Start Date End Date Taking? Authorizing Provider  amLODipine (NORVASC) 5 MG tablet Take 1 tablet by mouth daily. 10/07/20   [provider]  aspirin  EC 81 MG tablet Take 1 tablet (81 mg total) by mouth daily. Swallow whole. 02/21/24 02/20/25  Schnier, Cordella [name], MD  atorvastatin (LIPITOR)  20 MG tablet Take 20 mg by mouth daily.    [provider]  atorvastatin (LIPITOR) 20 MG tablet Take 1 tablet by mouth daily. 05/10/21   [provider]  clopidogrel  (PLAVIX ) 75 MG tablet Take 1 tablet (75 mg total) by mouth daily. 02/21/24   Schnier, Gregory G, MD  metFORMIN (GLUCOPHAGE) 500 MG tablet Take 1 tablet by mouth 2 (two) times daily with a meal. 06/15/20   [provider]  metoprolol  tartrate (LOPRESSOR ) 25 MG tablet Take 0.5 tablets (12.5 mg total) by mouth 2 (two) times daily. Patient not taking: Reported on 06/17/2024 08/13/20   Josette Ade, MD  Multiple Vitamins-Minerals (SENTRY PO) Take by mouth daily.    [provider]  pantoprazole  (PROTONIX ) 40 MG tablet Take 1 tablet (40 mg total) by mouth daily. Patient not taking: Reported on 06/17/2024 08/13/20 01/25/25  Josette Ade, MD  polyethylene glycol (MIRALAX ) 17 g packet Take 17 g by mouth 2 (two) times daily. Patient not taking: Reported on 06/17/2024 08/14/20   Josette Ade, MD  polyethylene glycol powder (GLYCOLAX /MIRALAX ) 17 GM/SCOOP powder Take by mouth. Patient not taking: Reported on 06/17/2024 03/25/21   [provider]  potassium chloride  (KLOR-CON ) 10 MEQ tablet Take 10 mEq by mouth daily. 05/11/20   [provider]  timolol (TIMOPTIC) 0.5 % ophthalmic solution 1 drop 2 (two) times daily.    [provider]  triamterene-hydrochlorothiazide (MAXZIDE-25) 37.5-25 MG tablet Take 1 tablet by mouth daily. 10/07/23   [provider]     Critical care time: 40 minutes     Inge Lecher, AGACNP-BC [facility] Prefer epic messenger for cross cover needs If after hours, please call E-link

## 2024-07-31 NOTE — Progress Notes (Signed)
 PHARMACY - ANTICOAGULATION CONSULT NOTE  Pharmacy Consult for Heparin  Infusion Indication: pulmonary embolus  Allergies  Allergen Reactions   Codeine     Patient Measurements: Height: 5' 7 (170.2 cm) Weight: 108.1 kg (238 lb 5.1 oz) IBW/kg (Calculated) : 61.6 HEPARIN  DW (KG): 86.1  Vital Signs: Temp: 98.9 F (37.2 C) (11/19 0400) Temp Source: Axillary (11/19 0400) BP: 113/54 (11/19 0400) Pulse Rate: 91 (11/19 0400)  Labs: Recent Labs    07/29/24 0531 07/29/24 1741 07/29/24 2019 07/30/24 0429 07/30/24 1134 07/31/24 0421  HGB 7.8*  --  7.1* 6.9* 8.4* 7.9*  HCT 24.5*  --  22.6* 22.2* 26.1* 24.5*  PLT 140*  --  135* 138* 155 150  APTT  --  34  --   --   --   --   LABPROT  --  15.2  --   --   --   --   INR  --  1.1  --   --   --   --   HEPARINUNFRC  --   --   --  0.40 0.50 0.42  CREATININE 1.13*  --  1.30* 1.29*  --   --     Estimated Creatinine Clearance: 44.8 mL/min (A) (by C-G formula based on SCr of 1.29 mg/dL (H)).   Medical History: Past Medical History:  Diagnosis Date   Celiac artery stenosis    CME (cystoid macular edema)    right   CNVM (choroidal neovascular membrane)    Diabetes mellitus without complication (HCC)    Diverticulosis of colon    Elevated lipids    History of cataract    History of rectal bleeding    Hx of adenomatous colonic polyps    Hyperlipidemia    Hypertension    Hypertensive retinopathy of both eyes    Lymphedema    Primary open angle glaucoma (POAG) of both eyes     Medications:  Not on anticoagulation previously  Assessment: Patient is a 78 year old female with a past medical history of peripheral vascular disease, hypertension, type II diabetes who presented to the ED on 07/25/2024 for abdominal pain, found to have ruptured aortic aneurysm that was repaired on 11/13. Patient taken for CTA today and was found to have a saddle pulmonary embolism extending into segmental branches of all lobes bilaterally. Pharmacy was  consulted to initiate patient on a heparin  infusion.  Baseline labs: INR 1.1 aPTT 34s Hgb 7.8 and PLT 140- low but stable No signs/symptoms of bleeding noted in chart  Goal of Therapy:  Heparin  level 0.3-0.7 units/ml Monitor platelets by anticoagulation protocol: Yes   Plan: 11/19:  HL @ 0421 = 0.42, therapeutic X 3 -- Will continue pt on current rate and recheck HL on 11/20 -- Monitor CBC daily while on heparin    Arshiya Jakes D, PharmD 07/31/2024,5:08 AM

## 2024-07-31 NOTE — Progress Notes (Signed)
 Progress Note    07/31/2024 8:22 AM 2 Days Post-Op  Subjective:  Rose Perry is a 78 yo female who is now POD #4 from:   PROCEDURE: US  guidance for vascular access, bilateral femoral arteries Catheter placement into aorta from bilateral femoral approaches Catheter placement into the right internal iliac artery from the left common femoral approach third order catheter placement Coil embolization of the right internal iliac artery for treatment of the right common iliac artery aneurysm. Placement of a 28 x 14 x 12 C3 conformable Gore Excluder Endoprosthesis main body with a 16 x 10 left iliac extension limb with a 12 x 14 contralateral limb that extends into the right external iliac artery for treatment of the common iliac artery aneurysm Placement of a 28 mm proximal aortic extension cuff Selective catheterization of the left renal artery first-order catheter placement. Placement of a 7 mm x 26 mm Lifestream stent left renal artery ProGlide closure devices bilateral femoral arteries   PRE-OPERATIVE DIAGNOSIS: Ruptured juxtarenal AAA with hemodynamic shock   POST-OPERATIVE DIAGNOSIS: same   SURGEON: Rose Shawl, MD and Rose Gu, MD - Co-surgeons   ANESTHESIA: general   POD #1   Date of Surgery: 07/29/2024,7:54 PM   Surgeon:Rose Perry, Rose Perry    Pre-operative Diagnosis: Symptomatic pulmonary emboli with right heart strain and hypoxia   Post-operative diagnosis:  Same   Procedure(s) Performed:             1.  Contrast injection right heart and bilateral pulmonary arteries             2.  Placement of a right IJ triple-lumen catheter with ultrasound guidance             3.  Mechanical thrombectomy bilateral lobar pulmonary arteries for removal of pulmonary emboli using the Penumbra CAT 8 thrombectomy catheter.             4.  Selective catheter placement right upper lobe pulmonary artery, middle lobe pulmonary artery and lower lobe pulmonary artery             5.   Selective catheter placement left upper lobe pulmonary artery and lower lobe pulmonary artery.             6.  Placement of a Denali infrarenal IVC filter   Patient is resting comfortably this morning in bed.  She remains on BiPAP with a heart rate of 86 and regular O2 saturations of 97%. Improved from yesterday.  Nonlabored breathing this morning. Pro BNP down to 5122 today from 6245 yesterday.  Bilateral lower extremities are warm to touch with palpable pulses.  No complications to note post pulmonary thrombectomy.  Patient received 1 unit of packed red blood cells yesterday and Hgb today is 7.9. No complaints overnight and vitals all remain stable.    Vitals:   07/31/24 0600 07/31/24 0756  BP: (!) 119/55 (!) 115/50  Pulse: 87   Resp: (!) 22   Temp:  98.3 F (36.8 C)  SpO2: 96%    Physical Exam: Cardiac: RRR with PVC's, normal S1 and S2.  No murmurs appreciated. Lungs: Nonlabored breathing on BIPap.  No rales rhonchi or wheezing noted. Diminished lung sounds in the right lower and middle lobe.  Incisions: Bilateral groin puncture sites.  Dressings clean dry and intact and without any hematoma seroma to note. Extremities: All extremities are warm to touch.  Positive palpable pulses in bilateral lower extremities. Abdomen: Positive bowel sounds throughout, patient's mid abdomen remains tender  upon palpation but this is expected.  Nondistended this morning. Neurologic: Alert and oriented X 3. Answers all questions and follows commands.  CBC    Component Value Date/Time   WBC 11.8 (H) 07/31/2024 0421   RBC 2.76 (L) 07/31/2024 0421   HGB 7.9 (L) 07/31/2024 0421   HGB 11.9 (L) 08/22/2013 0455   HCT 24.5 (L) 07/31/2024 0421   HCT 43.8 08/07/2013 0925   PLT 150 07/31/2024 0421   PLT 217 08/22/2013 0455   MCV 88.8 07/31/2024 0421   MCV 78 (L) 08/07/2013 0925   MCH 28.6 07/31/2024 0421   MCHC 32.2 07/31/2024 0421   RDW 16.5 (H) 07/31/2024 0421   RDW 14.4 08/07/2013 0925   LYMPHSABS  2.0 08/13/2020 0527   MONOABS 0.5 08/13/2020 0527   EOSABS 0.4 08/13/2020 0527   BASOSABS 0.0 08/13/2020 0527    BMET    Component Value Date/Time   NA 140 07/31/2024 0421   NA 137 08/23/2013 0519   K 3.3 (L) 07/31/2024 0421   K 3.4 (L) 08/23/2013 0519   CL 104 07/31/2024 0421   CL 103 08/23/2013 0519   CO2 28 07/31/2024 0421   CO2 30 08/23/2013 0519   GLUCOSE 120 (H) 07/31/2024 0421   GLUCOSE 168 (H) 08/23/2013 0519   BUN 30 (H) 07/31/2024 0421   BUN 12 08/23/2013 0519   CREATININE 1.31 (H) 07/31/2024 0421   CREATININE 1.17 08/23/2013 0519   CALCIUM  8.5 (L) 07/31/2024 0421   CALCIUM  8.2 (L) 08/23/2013 0519   GFRNONAA 41 (L) 07/31/2024 0421   GFRNONAA 48 (L) 08/23/2013 0519   GFRAA 55 (L) 08/23/2013 0519    INR    Component Value Date/Time   INR 1.1 07/29/2024 1741   INR 1.0 08/07/2013 0925     Intake/Output Summary (Last 24 hours) at 07/31/2024 9177 Last data filed at 07/31/2024 0600 Gross per 24 hour  Intake 1205.22 ml  Output 1175 ml  Net 30.22 ml     Assessment/Plan:  78 y.o. female is s/p SEE ABOVE 2 Days Post-Op  PLAN Greatly appreciate ICU team and Hospitalist's input and care.  Continue Heparin  infusion.  Continue Amiodarone  Chest Xr this Am with patchy bibasilar opacities with small right pleural effusion  Continue diuresis Okay to obtain Venous duplex ultrasounds of the patients bilateral lower extremities.   DVT prophylaxis:  Heparin  Infusion    [facility] 07/31/2024 8:22 AM

## 2024-07-31 NOTE — Progress Notes (Signed)
 [facility] CLINIC CARDIOLOGY PROGRESS NOTE       Patient ID: [name] MRN: [id number] DOB/AGE: 1946/02/13 78 y.o.  Admit date: 07/25/2024 Referring Physician Robet Kim, PA - PCCM Primary Physician Marikay Eva POUR, PA  Primary Cardiologist None Reason for Consultation Decompensated respiratory status, elevated troponin  HPI: Rose Perry is a 78 y.o. female  with a past medical history of peripheral vascular disease, hypertension, type II diabetes who presented to the ED on 07/25/2024 for abdominal pain, found to have ruptured aortic aneurysm. Underwent repair on 07/25/2024. Over the weekend into this AM she developed worsening respiratory status. Troponin noted to be elevated. Cardiology was consulted for further evaluation.   Interval history: -Patient seen and examined this AM, resting comfortably in hospital bed on BiPAP. Desats yesterday when tried on HF [place].  -Underwent pulmonary thrombectomy 11/17.   -BP and HR stable this AM.   Review of systems complete and found to be negative unless listed above    Past Medical History:  Diagnosis Date   Celiac artery stenosis    CME (cystoid macular edema)    right   CNVM (choroidal neovascular membrane)    Diabetes mellitus without complication (HCC)    Diverticulosis of colon    Elevated lipids    History of cataract    History of rectal bleeding    Hx of adenomatous colonic polyps    Hyperlipidemia    Hypertension    Hypertensive retinopathy of both eyes    Lymphedema    Primary open angle glaucoma (POAG) of both eyes     Past Surgical History:  Procedure Laterality Date   ABDOMINAL HYSTERECTOMY     CENTRAL LINE INSERTION  07/29/2024   Procedure: CENTRAL LINE INSERTION;  Surgeon: Jama Cordella [name], MD;  Location: [facility] INVASIVE CV LAB;  Service: Cardiovascular;;   COLONOSCOPY     COLONOSCOPY WITH PROPOFOL  N/A 08/22/2016   Procedure: COLONOSCOPY WITH PROPOFOL ;  Surgeon: Lamar ONEIDA Holmes, MD;  Location:  [facility] ENDOSCOPY;  Service: Endoscopy;  Laterality: N/A;   COLONOSCOPY WITH PROPOFOL  N/A 08/11/2020   Procedure: COLONOSCOPY WITH PROPOFOL ;  Surgeon: Maryruth Ole ONEIDA, MD;  Location: [facility] ENDOSCOPY;  Service: Endoscopy;  Laterality: N/A;   COLONOSCOPY WITH PROPOFOL  N/A 11/12/2021   Procedure: COLONOSCOPY WITH PROPOFOL ;  Surgeon: Maryruth Ole ONEIDA, MD;  Location: [facility] ENDOSCOPY;  Service: Endoscopy;  Laterality: N/A;  DM   COLONOSCOPY WITH PROPOFOL  N/A 08/18/2023   Procedure: COLONOSCOPY WITH PROPOFOL ;  Surgeon: Maryruth Ole ONEIDA, MD;  Location: [facility] ENDOSCOPY;  Service: Endoscopy;  Laterality: N/A;   ENDOVASCULAR STENT GRAFT (AAA) N/A 07/25/2024   Procedure: ENDOVASCULAR STENT GRAFT (AAA);  Surgeon: Jama Cordella [name], MD;  Location: [facility] INVASIVE CV LAB;  Service: Cardiovascular;  Laterality: N/A;  ruptured AAA   ENDOVASCULAR STENT GRAFT REPAIR  07/25/2024   Procedure: ENDOVASCULAR STENT GRAFT REPAIR;  Surgeon: Jama Cordella [name], MD;  Location: [facility] INVASIVE CV LAB;  Service: Cardiovascular;;   ESOPHAGOGASTRODUODENOSCOPY (EGD) WITH PROPOFOL  N/A 08/11/2020   Procedure: ESOPHAGOGASTRODUODENOSCOPY (EGD) WITH PROPOFOL ;  Surgeon: Maryruth Ole ONEIDA, MD;  Location: [facility] ENDOSCOPY;  Service: Endoscopy;  Laterality: N/A;   EYE SURGERY     IVC FILTER INSERTION N/A 07/29/2024   Procedure: IVC FILTER INSERTION;  Surgeon: Jama Cordella [name], MD;  Location: [facility] INVASIVE CV LAB;  Service: Cardiovascular;  Laterality: N/A;   JOINT REPLACEMENT Left    left total knee replacement   LOWER EXTREMITY ANGIOGRAPHY Right 02/20/2024   Procedure: Lower Extremity Angiography;  Surgeon:  Schnier, Cordella [name], MD;  Location: [facility] INVASIVE CV LAB;  Service: Cardiovascular;  Laterality: Right;   ltkr     needle in foot removed     POLYPECTOMY  08/18/2023   Procedure: POLYPECTOMY;  Surgeon: Maryruth Ole DASEN, MD;  Location: [facility] ENDOSCOPY;  Service: Endoscopy;;   PULMONARY THROMBECTOMY Bilateral 07/29/2024    Procedure: PULMONARY THROMBECTOMY;  Surgeon: Jama Cordella [name], MD;  Location: [facility] INVASIVE CV LAB;  Service: Cardiovascular;  Laterality: Bilateral;   REFRACTIVE SURGERY      Medications Prior to Admission  Medication Sig Dispense Refill Last Dose/Taking   amLODipine (NORVASC) 5 MG tablet Take 1 tablet by mouth daily.   07/24/2024   aspirin  EC 81 MG tablet Take 1 tablet (81 mg total) by mouth daily. Swallow whole. 150 tablet 1 07/24/2024   atorvastatin (LIPITOR) 20 MG tablet Take 1 tablet by mouth daily.   07/24/2024   clopidogrel  (PLAVIX ) 75 MG tablet Take 1 tablet (75 mg total) by mouth daily. 30 tablet 4 07/24/2024 Morning   metFORMIN (GLUCOPHAGE) 500 MG tablet Take 1 tablet by mouth 2 (two) times daily with a meal.   07/24/2024   metoprolol  tartrate (LOPRESSOR ) 25 MG tablet Take 0.5 tablets (12.5 mg total) by mouth 2 (two) times daily. 30 tablet 0 07/24/2024   pantoprazole  (PROTONIX ) 40 MG tablet Take 1 tablet (40 mg total) by mouth daily. 30 tablet 0 07/24/2024   polyethylene glycol (MIRALAX ) 17 g packet Take 17 g by mouth 2 (two) times daily. 60 each 0 Taking   potassium chloride  (KLOR-CON ) 10 MEQ tablet Take 10 mEq by mouth daily.   07/24/2024   timolol  (TIMOPTIC ) 0.5 % ophthalmic solution Place 1 drop into both eyes 2 (two) times daily.   07/24/2024   triamterene-hydrochlorothiazide (MAXZIDE-25) 37.5-25 MG tablet Take 1 tablet by mouth daily.   07/24/2024   dorzolamide -timolol  (COSOPT ) 2-0.5 % ophthalmic solution Place 1 drop into both eyes 2 (two) times daily.   Unknown   Multiple Vitamins-Minerals (SENTRY PO) Take by mouth daily.   Unknown   Social History   Socioeconomic History   Marital status: Divorced    Spouse name: Not on file   Number of children: Not on file   Years of education: Not on file   Highest education level: Not on file  Occupational History   Not on file  Tobacco Use   Smoking status: Former   Smokeless tobacco: Never  Vaping Use   Vaping status:  Never Used  Substance and Sexual Activity   Alcohol use: No   Drug use: No   Sexual activity: Not Currently  Other Topics Concern   Not on file  Social History Narrative   There are several medical conditions patient states she has never had. This RN was deleting some when the patient asked me to stop so she could have list. The ones I deleted are AAA, arthritis, and anemia.   Social Drivers of Corporate Investment Banker Strain: Low Risk  (01/18/2023)   Received from [facility]   Overall Financial Resource Strain (CARDIA)    Difficulty of Paying Living Expenses: Not hard at all  Food Insecurity: Patient Unable To Answer (07/25/2024)   Hunger Vital Sign    Worried About Running Out of Food in the Last Year: Patient unable to answer    Ran Out of Food in the Last Year: Patient unable to answer  Transportation Needs: Patient Unable To Answer (07/25/2024)   PRAPARE - Transportation  Lack of Transportation (Medical): Patient unable to answer    Lack of Transportation (Non-Medical): Patient unable to answer  Physical Activity: Not on file  Stress: Not on file  Social Connections: Patient Unable To Answer (07/25/2024)   Social Connection and Isolation Panel    Frequency of Communication with Friends and Family: Patient unable to answer    Frequency of Social Gatherings with Friends and Family: Patient unable to answer    Attends Religious Services: Patient unable to answer    Active Member of Clubs or Organizations: Patient unable to answer    Attends Banker Meetings: Patient unable to answer    Marital Status: Patient unable to answer  Intimate Partner Violence: Patient Unable To Answer (07/25/2024)   Humiliation, Afraid, Rape, and Kick questionnaire    Fear of Current or Ex-Partner: Patient unable to answer    Emotionally Abused: Patient unable to answer    Physically Abused: Patient unable to answer    Sexually Abused: Patient unable to answer     Family History  Problem Relation Age of Onset   Hypertension Mother    Hyperlipidemia Mother    Hypertension Father    Coronary artery disease Brother    Breast cancer Neg Hx      Vitals:   07/31/24 0448 07/31/24 0500 07/31/24 0600 07/31/24 0756  BP:  111/60 (!) 119/55 (!) 115/50  Pulse:  85 87   Resp:  16 (!) 22   Temp:    98.3 F (36.8 C)  TempSrc:    Axillary  SpO2:  95% 96%   Weight: 108.1 kg     Height:        PHYSICAL EXAM General: Ill appearing female, well nourished, in no acute distress. HEENT: Normocephalic and atraumatic. Neck: No JVD.  Lungs: Normal respiratory effort on BiPAP.  Heart: HRRR, borderline rate. Normal S1 and S2 without gallops or murmurs.  Abdomen: Non-distended appearing.  Msk: Normal strength and tone for age. Extremities: Warm and well perfused. No clubbing, cyanosis. No edema.  Neuro: Alert and oriented X 3. Psych: Answers questions appropriately.   Labs: Basic Metabolic Panel: Recent Labs    07/30/24 0429 07/31/24 0421  NA 140 140  K 3.7 3.3*  CL 104 104  CO2 28 28  GLUCOSE 132* 120*  BUN 26* 30*  CREATININE 1.29* 1.31*  CALCIUM  8.5* 8.5*  MG 2.1 2.1  PHOS 2.8 2.5   Liver Function Tests: Recent Labs    07/29/24 0531 07/29/24 2019  AST 102* 99*  ALT 17 27  ALKPHOS 54 60  BILITOT 0.7 0.6  PROT 5.7* 5.4*  ALBUMIN  3.4* 3.1*   No results for input(s): LIPASE, AMYLASE in the last 72 hours. CBC: Recent Labs    07/30/24 1134 07/31/24 0421  WBC 9.6 11.8*  HGB 8.4* 7.9*  HCT 26.1* 24.5*  MCV 88.2 88.8  PLT 155 150   Cardiac Enzymes: No results for input(s): CKTOTAL, CKMB, CKMBINDEX, TROPONINIHS in the last 72 hours. BNP: No results for input(s): BNP in the last 72 hours. D-Dimer: No results for input(s): DDIMER in the last 72 hours. Hemoglobin A1C: No results for input(s): HGBA1C in the last 72 hours. Fasting Lipid Panel: No results for input(s): CHOL, HDL, LDLCALC, TRIG, CHOLHDL,  LDLDIRECT in the last 72 hours. Thyroid Function Tests: No results for input(s): TSH, T4TOTAL, T3FREE, THYROIDAB in the last 72 hours.  Invalid input(s): FREET3 Anemia Panel: No results for input(s): VITAMINB12, FOLATE, FERRITIN, TIBC, IRON , RETICCTPCT in the  last 72 hours.   Radiology: [facility] Chest Port 1 View Result Date: 07/31/2024 EXAM: 1 VIEW(S) XRAY OF THE CHEST 07/31/2024 04:11:00 AM COMPARISON: 07/29/2024 CLINICAL HISTORY: Shortness of breath FINDINGS: LINES, TUBES AND DEVICES: Right IJ CVC in place with tip overlying right atrium. LUNGS AND PLEURA: Diffuse interstitial prominence with patchy bibasilar opacities. Small right pleural effusion. No pneumothorax. HEART AND MEDIASTINUM: Stable cardiomegaly. Aortic atherosclerosis. BONES AND SOFT TISSUES: No acute osseous abnormality. IMPRESSION: 1. Diffuse interstitial prominence with patchy bibasilar opacities, which may reflect edema or infection, and a small right pleural effusion. Electronically signed by: Waddell Calk MD 07/31/2024 05:58 AM EST RP Workstation: [id number]   US  Venous Img Lower Bilateral (DVT) Result Date: 07/30/2024 CLINICAL DATA:  Pulmonary embolism. EXAM: BILATERAL LOWER EXTREMITY VENOUS DOPPLER ULTRASOUND TECHNIQUE: Gray-scale sonography with graded compression, as well as color Doppler and duplex ultrasound were performed to evaluate the lower extremity deep venous systems from the level of the common femoral vein and including the common femoral, femoral, profunda femoral, popliteal and calf veins including the posterior tibial, peroneal and gastrocnemius veins when visible. The superficial great saphenous vein was also interrogated. Spectral Doppler was utilized to evaluate flow at rest and with distal augmentation maneuvers in the common femoral, femoral and popliteal veins. COMPARISON:  None Available. FINDINGS: RIGHT LOWER EXTREMITY Common Femoral Vein: Small amount of echogenic nonocclusive  thrombus. Saphenofemoral Junction: No evidence of thrombus. Normal compressibility and flow on color Doppler imaging. Profunda Femoral Vein: No evidence of thrombus. Normal compressibility and flow on color Doppler imaging. Femoral Vein: No evidence of thrombus. Normal compressibility, respiratory phasicity and response to augmentation. Popliteal Vein: Nonocclusive thrombus in the mid popliteal vein. Calf Veins: Nonocclusive thrombus visualized in posterior tibial and peroneal veins. Superficial Great Saphenous Vein: No evidence of thrombus. Normal compressibility. Venous Reflux:  None. Other Findings: No evidence of superficial thrombophlebitis or abnormal fluid collection. LEFT LOWER EXTREMITY Common Femoral Vein: No evidence of thrombus. Normal compressibility, respiratory phasicity and response to augmentation. Saphenofemoral Junction: No evidence of thrombus. Normal compressibility and flow on color Doppler imaging. Profunda Femoral Vein: No evidence of thrombus. Normal compressibility and flow on color Doppler imaging. Femoral Vein: No evidence of thrombus. Normal compressibility, respiratory phasicity and response to augmentation. Popliteal Vein: No evidence of thrombus. Normal compressibility, respiratory phasicity and response to augmentation. Calf Veins: No evidence of thrombus. Normal compressibility and flow on color Doppler imaging. Superficial Great Saphenous Vein: No evidence of thrombus. Normal compressibility. Venous Reflux:  None. Other Findings: No evidence of superficial thrombophlebitis or abnormal fluid collection. IMPRESSION: Small amount of nonocclusive thrombus in the right common femoral vein. Nonocclusive thrombus in the right popliteal, posterior tibial and peroneal veins. Electronically Signed   By: Marcey Moan M.D.   On: 07/30/2024 11:52   PERIPHERAL VASCULAR CATHETERIZATION Result Date: 07/29/2024 See surgical note for result.  CARDIAC CATHETERIZATION Result Date:  07/29/2024 See surgical note for result.  CT Angio Chest Pulmonary Embolism (PE) W or WO Contrast Result Date: 07/29/2024 CLINICAL DATA:  Acute respiratory failure EXAM: CT ANGIOGRAPHY CHEST WITH CONTRAST TECHNIQUE: Multidetector CT imaging of the chest was performed using the standard protocol during bolus administration of intravenous contrast. Multiplanar CT image reconstructions and MIPs were obtained to evaluate the vascular anatomy. RADIATION DOSE REDUCTION: This exam was performed according to the departmental dose-optimization program which includes automated exposure control, adjustment of the mA and/or kV according to patient size and/or use of iterative reconstruction technique. CONTRAST:  75mL OMNIPAQUE  IOHEXOL  350 MG/ML SOLN  COMPARISON:  CT chest 07/28/2024. FINDINGS: Cardiovascular: There is adequate opacification of the pulmonary arteries. A saddle pulmonary embolism is identified extending into segmental branches of all lobes bilaterally. The heart is moderately enlarged. There is a small pericardial effusion. Aorta is normal in size. There are atherosclerotic calcifications of the aorta. Mediastinum/Nodes: No enlarged mediastinal, hilar, or axillary lymph nodes. Thyroid gland, trachea, and esophagus demonstrate no significant findings. Lungs/Pleura: Mild emphysema present. There is a stable 1 cm left upper lobe pulmonary nodule. Trace left and small right pleural effusions are present. There is right lower lobe airspace consolidation and collapse. There is no pneumothorax. Upper Abdomen: There is moderate ascites in the upper abdomen. Musculoskeletal: No chest wall abnormality. No acute or significant osseous findings. Review of the MIP images confirms the above findings. IMPRESSION: 1. Saddle pulmonary embolism extending into segmental branches of all lobes bilaterally. Positive for acute PE with CTevidence of right heart strain (RV/LV Ratio = 1.2) consistent with at least submassive  (intermediate risk) PE. The presence of right heart strain has been associated with an increased risk of morbidity and mortality. 2. Moderate cardiomegaly. 3. Small pericardial effusion. 4. Trace left and small right pleural effusions. 5. Right lower lobe airspace consolidation and collapse. 6. Moderate ascites in the upper abdomen. Aortic Atherosclerosis (ICD10-I70.0). Electronically Signed   By: Greig Pique M.D.   On: 07/29/2024 15:58   DG Chest Port 1 View Result Date: 07/29/2024 CLINICAL DATA:  Dyspnea. EXAM: PORTABLE CHEST 1 VIEW COMPARISON:  07/28/2024.  Chest CT dated 07/28/2024. FINDINGS: Poor inspiration. Stable mildly enlarged cardiac silhouette. Mildly increased patchy density at the right lung base. Minimally improved left lower lobe atelectasis. Thoracic spine degenerative changes. IMPRESSION: 1. Mildly increased patchy atelectasis or pneumonia at the right lung base. 2. Minimally improved left lower lobe atelectasis. 3. Stable mild cardiomegaly. Electronically Signed   By: Elspeth Bathe M.D.   On: 07/29/2024 14:20   ECHOCARDIOGRAM COMPLETE Result Date: 07/29/2024    ECHOCARDIOGRAM REPORT   Patient Name:   GWENDLOYN FORSEE Date of Exam: 07/29/2024 Medical Rec #:  [id number]      Height:       67.0 in Accession #:    [id number]     Weight:       236.3 lb Date of Birth:  01/14/1946     BSA:          2.171 m Patient Age:    78 years       BP:           116/59 mmHg Patient Gender: F              HR:           103 bpm. Exam Location:  [facility] Procedure: 2D Echo, Cardiac Doppler and Color Doppler (Both Spectral and Color            Flow Doppler were utilized during procedure). Indications:     Dyspnea R06.00  History:         Patient has no prior history of Echocardiogram examinations.                  Risk Factors:Diabetes and Hypertension.  Sonographer:     Christopher Furnace Referring Phys:  [id number] MAGDALENE S TUKOV-YUAL Diagnosing Phys: Dwayne D Callwood MD IMPRESSIONS  1. Left ventricular ejection fraction,  by estimation, is 65 to 70%. The left ventricle has normal function. The left ventricle has no regional wall motion abnormalities. There is moderate  asymmetric left ventricular hypertrophy. Left ventricular diastolic parameters are consistent with Grade I diastolic dysfunction (impaired relaxation).  2. Right ventricular systolic function is mildly reduced. The right ventricular size is moderately enlarged.  3. Left atrial size was mildly dilated.  4. Right atrial size was mildly dilated.  5. The mitral valve is normal in structure. Trivial mitral valve regurgitation.  6. The aortic valve is normal in structure. Aortic valve regurgitation is trivial. Aortic valve sclerosis/calcification is present, without any evidence of aortic stenosis. FINDINGS  Left Ventricle: Left ventricular ejection fraction, by estimation, is 65 to 70%. The left ventricle has normal function. The left ventricle has no regional wall motion abnormalities. Strain was performed and the global longitudinal strain is indeterminate. The left ventricular internal cavity size was normal in size. There is moderate asymmetric left ventricular hypertrophy. Left ventricular diastolic parameters are consistent with Grade I diastolic dysfunction (impaired relaxation). Right Ventricle: The right ventricular size is moderately enlarged. No increase in right ventricular wall thickness. Right ventricular systolic function is mildly reduced. Left Atrium: Left atrial size was mildly dilated. Right Atrium: Right atrial size was mildly dilated. Pericardium: There is no evidence of pericardial effusion. Mitral Valve: The mitral valve is normal in structure. Trivial mitral valve regurgitation. MV peak gradient, 5.3 mmHg. The mean mitral valve gradient is 2.0 mmHg. Tricuspid Valve: The tricuspid valve is normal in structure. Tricuspid valve regurgitation is trivial. Aortic Valve: The aortic valve is normal in structure. Aortic valve regurgitation is trivial. Aortic  valve sclerosis/calcification is present, without any evidence of aortic stenosis. Aortic valve mean gradient measures 4.0 mmHg. Aortic valve peak gradient measures 8.3 mmHg. Aortic valve area, by VTI measures 2.62 cm. Pulmonic Valve: The pulmonic valve was normal in structure. Pulmonic valve regurgitation is not visualized. Aorta: The ascending aorta was not well visualized. IAS/Shunts: No atrial level shunt detected by color flow Doppler. Additional Comments: 3D was performed not requiring image post processing on an independent workstation and was indeterminate.  LEFT VENTRICLE PLAX 2D LVIDd:         4.70 cm   Diastology LVIDs:         2.70 cm   LV e' medial:    5.33 cm/s LV PW:         1.00 cm   LV E/e' medial:  10.3 LV IVS:        1.40 cm   LV e' lateral:   7.07 cm/s LVOT diam:     2.10 cm   LV E/e' lateral: 7.8 LV SV:         48 LV SV Index:   22 LVOT Area:     3.46 cm LV IVRT:       77 msec  RIGHT VENTRICLE RV Basal diam:  4.20 cm     PULMONARY VEINS RV Mid diam:    3.60 cm     Diastolic Velocity: 17.00 cm/s RV S prime:     13.40 cm/s  S/D Velocity:       2.60 TAPSE (M-mode): 1.8 cm      Systolic Velocity:  43.40 cm/s LEFT ATRIUM           Index        RIGHT ATRIUM           Index LA diam:      4.00 cm 1.84 cm/m   RA Area:     16.80 cm LA Vol (A2C): 31.4 ml 14.46 ml/m  RA Volume:   45.30  ml  20.87 ml/m LA Vol (A4C): 88.4 ml 40.72 ml/m  AORTIC VALVE AV Area (Vmax):    2.50 cm AV Area (Vmean):   2.35 cm AV Area (VTI):     2.62 cm AV Vmax:           144.00 cm/s AV Vmean:          96.100 cm/s AV VTI:            0.184 m AV Peak Grad:      8.3 mmHg AV Mean Grad:      4.0 mmHg LVOT Vmax:         104.00 cm/s LVOT Vmean:        65.200 cm/s LVOT VTI:          0.139 m LVOT/AV VTI ratio: 0.76  AORTA Ao Root diam: 3.20 cm MITRAL VALVE               TRICUSPID VALVE MV Area (PHT): 3.76 cm    TR Peak grad:   44.6 mmHg MV Area VTI:   2.56 cm    TR Vmax:        334.00 cm/s MV Peak grad:  5.3 mmHg MV Mean grad:  2.0  mmHg    SHUNTS MV Vmax:       1.15 m/s    Systemic VTI:  0.14 m MV Vmean:      69.7 cm/s   Systemic Diam: 2.10 cm MV Decel Time: 202 msec MV E velocity: 55.00 cm/s MV A velocity: 96.40 cm/s MV E/A ratio:  0.57 Dwayne D Callwood MD Electronically signed by Cara JONETTA Lovelace MD Signature Date/Time: 07/29/2024/1:48:43 PM    Final    CT CHEST WO CONTRAST Result Date: 07/28/2024 EXAM: CT CHEST WITHOUT CONTRAST 07/28/2024 06:19:00 PM TECHNIQUE: CT of the chest was performed without the administration of intravenous contrast. Multiplanar reformatted images are provided for review. Automated exposure control, iterative reconstruction, and/or weight based adjustment of the mA/kV was utilized to reduce the radiation dose to as low as reasonably achievable. COMPARISON: None available. CLINICAL HISTORY: Pneumonia, complication suspected, xray done; Respiratory illness, nondiagnostic xray. FINDINGS: MEDIASTINUM: Small pericardial effusion. Atheromatous calcifications of aorta and coronary arteries. The central airways are clear. LYMPH NODES: No mediastinal, hilar or axillary lymphadenopathy. LUNGS AND PLEURA: Irregularly marginated 1.3 cm pleural-based left apical nodule. Atelectasis or dense alveolar consolidation of the right lower lobe. Small right-sided pleural effusion. No pneumothorax. SOFT TISSUES/BONES: Thoracic degenerative disc disease. No acute abnormality of the soft tissues. UPPER ABDOMEN: Limited images of the upper abdomen demonstrate ascites. IMPRESSION: 1. Atelectasis or dense alveolar consolidation of the right lower lobe 2. Irregularly marginated 1.3 cm (13 mm) pleural-based left apical pulmonary nodule; per Fleischner Society Guidelines for a single solid nodule 8.120 mm, recommend further evaluation with non-contrast chest CT at 3 months, PET/CT, or tissue sampling depending on clinical risk and morphology 3. Small right-sided pleural effusion 4. Small pericardial effusion Electronically signed by:  Fonda Field MD 07/28/2024 06:27 PM EST RP Workstation: [id number]   DG Chest 1 View Result Date: 07/28/2024 CLINICAL DATA:  Shortness of breath. Abdominal aortic aneurysm rupture and repair. EXAM: CHEST  1 VIEW COMPARISON:  07/27/2024. FINDINGS: The heart size and mediastinal contours are stable. There is atherosclerotic calcification aorta. Lung volumes are low. Hazy opacity is noted at the right lung base. There is mild atelectasis at the left lung base. No pneumothorax is seen. No acute osseous abnormality. IMPRESSION: 1. Hazy opacity at the right  lung base, possible atelectasis, infiltrate, and or pleural effusion. 2. Mild atelectasis at the left lung base. Electronically Signed   By: Leita Birmingham M.D.   On: 07/28/2024 14:52   DG Chest Port 1 View Result Date: 07/27/2024 CLINICAL DATA:  Respiratory failure with hypoxia. EXAM: PORTABLE CHEST 1 VIEW COMPARISON:  07/26/2024 FINDINGS: Lungs are hypoinflated with mild hazy bibasilar opacification likely due to atelectasis and possible small amount of bilateral pleural fluid. Infection in the lung bases is possible. Borderline stable cardiomegaly. Remainder of the exam is unchanged. IMPRESSION: Hypoinflation with mild hazy bibasilar opacification likely due to atelectasis and possible small amount of bilateral pleural fluid. Infection in the lung bases is possible. Electronically Signed   By: Toribio Agreste M.D.   On: 07/27/2024 11:15   DG Chest Port 1 View Result Date: 07/26/2024 CLINICAL DATA:  Respiratory with hypoxia. EXAM: PORTABLE CHEST 1 VIEW COMPARISON:  Radiographs 07/25/2024.  Abdominal CT 07/25/2024. FINDINGS: 0813 hours. Tip of the endotracheal tube is 5.7 cm above the carina. Enteric tube has been advanced with the side hole below the gastroesophageal junction. Right IJ central venous catheter projects to the inferior right atrium. Persistent low lung volumes with patchy opacities at both lung bases, likely atelectasis. Stable mild  blunting of the left costophrenic angle without significant pleural effusion on recent CT. Stable cardiomegaly and mediastinal contours. IMPRESSION: 1. Interval advancement of enteric tube with side hole below the gastroesophageal junction. 2. No other significant changes. Persistent low lung volumes with bibasilar atelectasis. Electronically Signed   By: Elsie Perone M.D.   On: 07/26/2024 12:05   PERIPHERAL VASCULAR CATHETERIZATION Result Date: 07/25/2024 See surgical note for result.  DG Abd 1 View Result Date: 07/25/2024 CLINICAL DATA:  OG placement EXAM: ABDOMEN - 1 VIEW COMPARISON:  CT 07/25/2024 FINDINGS: Right-sided central venous catheter tip at the low right atrium. Enteric tube tip folded back upon itself in the region of the stomach. Nonobstructed gas pattern. Aortic stent graft is noted. IMPRESSION: Enteric tube tip folded back upon itself in the region of the stomach. Electronically Signed   By: Luke Bun M.D.   On: 07/25/2024 16:30   DG Chest Port 1 View Result Date: 07/25/2024 CLINICAL DATA:  Intubated central line EXAM: PORTABLE CHEST 1 VIEW COMPARISON:  None Available. FINDINGS: Endotracheal tube tip is about 4.5 cm superior to the carina. Enteric tube tip at the level of distal esophagus, side-port in the region of mid esophagus. Right IJ central venous catheter tip at the low right atrium. Hypoventilatory changes. Mild cardiomegaly. Possible small left effusion. Suspect patchy atelectasis at the left base. IMPRESSION: 1. Endotracheal tube tip about 4.5 cm superior to the carina. 2. Enteric tube tip at the level of distal esophagus, side-port in the region of mid esophagus. Recommend advancement. See separately dictated abdominal radiograph 3. Right IJ central venous catheter tip at the low right atrium. 4. Hypoventilatory changes with possible small left effusion and patchy atelectasis at the left base. Electronically Signed   By: Luke Bun M.D.   On: 07/25/2024 16:29   CT  ABDOMEN PELVIS W CONTRAST Result Date: 07/25/2024 CLINICAL DATA:  Acute right flank pain with associated vomiting. History of abdominal aortic aneurysm measuring approximately 4.5 x 4.9 cm in dimensions by CTA in 2021. EXAM: CT ABDOMEN AND PELVIS WITH CONTRAST TECHNIQUE: Multidetector CT imaging of the abdomen and pelvis was performed using the standard protocol following bolus administration of intravenous contrast. RADIATION DOSE REDUCTION: This exam was performed according to  the departmental dose-optimization program which includes automated exposure control, adjustment of the mA and/or kV according to patient size and/or use of iterative reconstruction technique. CONTRAST:  OMNIPAQUE  IOHEXOL  300 MG/ML  SOLN COMPARISON:  CTA abdomen pelvis 08/10/2020 FINDINGS: Lower chest: No acute abnormality. Hepatobiliary: No focal liver abnormality is seen. No gallstones, gallbladder wall thickening, or biliary dilatation. Pancreas: Unremarkable. No pancreatic ductal dilatation or surrounding inflammatory changes. Spleen: Normal in size without focal abnormality. Adrenals/Urinary Tract: Adrenal glands are unremarkable. Kidneys are normal, without renal calculi, focal lesion, or hydronephrosis. Bladder is unremarkable. Stomach/Bowel: Bowel shows no evidence of obstruction, ileus, inflammation or lesion. The appendix is normal. No free intraperitoneal air. Vascular/Lymphatic: No lymphadenopathy. Significant interval enlargement infrarenal abdominal aortic aneurysm now measuring approximately 5.7 x 6.9 cm in maximum transverse dimensions with maximum oblique diameter of 7.0 cm. There is evidence to suggest acute focal leak from the right anterior aspect of the aneurysm with retroperitoneal stranding anterior to the aneurysm and to the right of the aneurysm also abutting the posterior duodenum and anterior aspect of the IVC. No dissection. Mural thrombus present in the aneurysm sac. Interval enlargement proximal right  common iliac artery aneurysm now measuring up to 3.5 cm. Reproductive: Status post hysterectomy. No adnexal masses. Other: Umbilical hernia containing fat. Musculoskeletal: No acute or significant osseous findings. IMPRESSION: 1. Interval enlargement of infrarenal abdominal aortic aneurysm now measuring up to 7 cm in greatest diameter. Evidence of acute focal retroperitoneal stranding anterior and to the right of the aneurysm consistent with focal hemorrhage/leak from the aneurysm. 2. Interval enlargement of right common iliac artery aneurysm now measuring up to 3.5 cm. 3. These results were called to the ordering clinician emergently by the CT Department and the patient will be transported to the Emergency Department. Electronically Signed   By: Marcey Moan M.D.   On: 07/25/2024 12:06    ECHO as above  TELEMETRY (personally reviewed): sinus rhythm rate 80  EKG (personally reviewed): sinus tachycardia rate 114 bpm  Data reviewed by me 07/31/2024: last 24h vitals tele labs imaging I/O ED provider note, admission H&P, PCCM notes, vascular surgery notes  Principal Problem:   AAA (abdominal aortic aneurysm, ruptured) (HCC) Active Problems:   Hemorrhagic shock (HCC)   Acute respiratory failure with hypoxia (HCC)   Atrial fibrillation with rapid ventricular response (HCC)   Acute saddle pulmonary embolism with acute cor pulmonale (HCC)    ASSESSMENT AND PLAN:  Rose Perry is a 78 y.o. female  with a past medical history of peripheral vascular disease, hypertension, type II diabetes who presented to the ED on 07/25/2024 for abdominal pain, found to have ruptured aortic aneurysm. Underwent repair on 07/25/2024. Over the weekend into this AM she developed worsening respiratory status. Troponin noted to be elevated. Cardiology was consulted for further evaluation.   # Elevated troponin, possibly demand # Acute hypoxic respiratory failure # Saddle pulmonary embolus # AAA with rupture s/p repair  07/25/24 # Paroxysmal atrial fibrillation # Hypertension Patient presented with abdominal pain, found to have ruptured AAA and underwent repair on 07/25/24. Yesterday developed worsening SOB and required BiPAP. Also with tachycardia - AF RVR and started on IV amiodarone. Troponins today found to be elevated at 2159 from 770 yesterday. CXR with patchy atelectasis. Net positive 7.4L since admit. Echo this admission with EF 65-70%, no WMAs, moderate asymmetric LVH, grade I diastolic dysfunction, moderate RV enlargement with mildly reduced function, trivial MR. CTA PE yesterday with saddle PE - underwent thrombectomy yesterday  evening.  - IV heparin  per vascular. - Continue IV lasix  40 mg daily.  - Suspect troponin elevation secondary to demand given her saddle PE, decompensated respiratory status, recent surgery, blood loss.  Echo overall reassuring. - Continue IV amiodarone .  - Further management per PCCM, vascular surgery.   This patient's plan of care was discussed and created with Dr. Florencio and he is in agreement.  Signed: Danita Bloch, PA-C  07/31/2024, 9:04 AM [facility] Cardiology

## 2024-07-31 NOTE — Progress Notes (Signed)
 Nutrition Follow Up Note   DOCUMENTATION CODES:   Obesity unspecified  INTERVENTION:   Ensure Plus High Protein po TID, each supplement provides 350 kcal and 20 grams of protein  MVI po daily   Thiamine 100mg  po daily x 7 days   Regular diet   Pt at high refeed risk; recommend monitor potassium, magnesium and phosphorus labs daily until stable  Daily weights   NUTRITION DIAGNOSIS:   Inadequate oral intake related to inability to eat (pt sedated and ventilated) as evidenced by NPO status. -ongoing   GOAL:   Patient will meet greater than or equal to 90% of their needs -not met   MONITOR:   PO intake, Supplement acceptance, Labs, Weight trends, I & O's, Skin  ASSESSMENT:   78 y/o female with h/o AAA, DM, HTN, HLD, lymphedema and diverticulosis who is admitted with ruptured abdominal aneurysm and hemorrhagic shock s/p coil embolization 11/13 complicated by PNA and PE s/p mechanical thrombectomy and IVC filter 11/17.  Pt off bipap today. Pt has remained NPO since extubation and is now without adequate nutrition for 5 days. Will initiate a regular diet today. RD will add supplements and MVI to help pt meet her estimated needs. Pt is at high refeed risk. No BM since admit. Per chart, pt is around her UBW currently.   Medications reviewed and include: colace, insulin , protonix , miralax , KCl, senokot, heparin , zosyn    Labs reviewed: K 3.3(L), BUN 30(H), creat 1.31(H), P 2.5 wnl, Mg 2.1 wnl Pro BNP- 5122(H) Wbc- 11.8(H), Hgb 7.9(L), Hct 24.5(L) Cbgs- 151, 115 x 24 hrs   UOP-   Diet Order:   Diet Order             Diet regular Room service appropriate? Yes; Fluid consistency: Thin  Diet effective now                  EDUCATION NEEDS:   No education needs have been identified at this time  Skin:  Skin Assessment: Reviewed RN Assessment (ecchymosis)  Last BM:  pta  Height:   Ht Readings from Last 1 Encounters:  07/25/24 5' 7 (1.702 m)    Weight:    Wt Readings from Last 1 Encounters:  07/31/24 108.1 kg    Ideal Body Weight:  61.3 kg  BMI:  Body mass index is 37.33 kg/m.  Estimated Nutritional Needs:   Kcal:  2000-2300kcal/day  Protein:  100-115g/day  Fluid:  1.7-1.9L/day  Augustin Shams MS, RD, LDN If unable to be reached, please send secure chat to RD inpatient available from 8:00a-4:00p daily

## 2024-07-31 NOTE — Plan of Care (Signed)
  Problem: Education: Goal: Knowledge of General Education information will improve Description: Including pain rating scale, medication(s)/side effects and non-pharmacologic comfort measures Outcome: Progressing   Problem: Nutrition: Goal: Adequate nutrition will be maintained Outcome: Progressing   Problem: Coping: Goal: Level of anxiety will decrease Outcome: Progressing   Problem: Elimination: Goal: Will not experience complications related to urinary retention Outcome: Progressing   Problem: Pain Managment: Goal: General experience of comfort will improve and/or be controlled Outcome: Progressing   Problem: Safety: Goal: Ability to remain free from injury will improve Outcome: Progressing

## 2024-07-31 NOTE — Progress Notes (Addendum)
 PHARMACY CONSULT NOTE - FOLLOW UP  Pharmacy Consult for Electrolyte Monitoring and Replacement   Recent Labs: Potassium (mmol/L)  Date Value  07/31/2024 3.3 (L)  08/23/2013 3.4 (L)   Magnesium (mg/dL)  Date Value  88/80/7974 2.1   Calcium (mg/dL)  Date Value  88/80/7974 8.5 (L)   Calcium, Total (mg/dL)  Date Value  87/87/7985 8.2 (L)   Albumin (g/dL)  Date Value  88/82/7974 3.1 (L)   Phosphorus (mg/dL)  Date Value  88/80/7974 2.5   Sodium (mmol/L)  Date Value  07/31/2024 140  08/23/2013 137     Assessment: 78 year old female with history of PAD, T2DM, and HTN who presented with abdominal pain found to have a ruptured abdominal aneurysm, admitted with hemorrhagic shock and taken emergency for AAA repair with vascular surgery. Pharmacy is asked to follow and replace electrolytes while in CCU  Diuretics: furosemide  40 mg IV once daily  Goal of Therapy:  Potassium 4.0 - 5.1 mmol/L Magnesium 2.0 - 2.4 mg/dL All Other Electrolytes WNL  Plan:  ---40 mEq po KCl x 2 ---recheck electrolytes in am  Adriana JONETTA Bolster ,PharmD Clinical Pharmacist 07/31/2024 7:28 AM

## 2024-07-31 NOTE — Evaluation (Signed)
 Occupational Therapy Re-Evaluation Patient Details Name: Rose Perry MRN: [id number] DOB: 1946/07/25 Today's Date: 07/31/2024   History of Present Illness   Pt is a 78 y/o F admitted on 07/25/24 after presenting with c/o worsening abdominal pain. Imaging was concerned for possible ruptured aneurysm. Pt emergently taken to the vascular lab for repair. Pt also being treated for hemorrhagic shock s/t AAA rupture s/p stent placement, a-fib with RVR. PMH: HTN, DM2, HLD, celiac artery stenosis. Thrombectomy on 11/17.     Clinical Impressions Upon entering the room, pt supine in bed and agreeable to OT intervention. Pt in chair position  in bed and O2 reduced to 8Ls this session. Focus on B UE strengthening exercises with maintaining focus on controlled breathing. Pt performing 10 reps of bicep curls, chest press, and shoulder elevation exercises with chux pad held by B hands pulled tight. Pt needing rest breaks between each set for recovery secondary to fatigue and RR increasing to 30's and O2 saturation dropping to 86% but recovers back into 90's before starting again. OT dicussed change in recommendation with pt and she agrees. Call bell and all needed items within reach.      If plan is discharge home, recommend the following:   A lot of help with walking and/or transfers;A lot of help with bathing/dressing/bathroom;Assistance with cooking/housework;Assist for transportation;Help with stairs or ramp for entrance      Equipment Recommendations   Other (comment) (defer to next venue of care)      Precautions/Restrictions   Precautions Precautions: Fall Precaution/Restrictions Comments: monitor O2, HR            ADL either performed or assessed with clinical judgement      Vision Patient Visual Report: No change from baseline              Pertinent Vitals/Pain Pain Assessment Pain Assessment: No/denies pain     Extremity/Trunk Assessment Upper Extremity  Assessment Upper Extremity Assessment: Generalized weakness   Lower Extremity Assessment Lower Extremity Assessment: Generalized weakness       Communication Communication Communication: No apparent difficulties   Cognition Arousal: Alert Behavior During Therapy: WFL for tasks assessed/performed Cognition: No apparent impairments                               Following commands: Intact       Cueing  General Comments   Cueing Techniques: Verbal cues          OT Goals(Current goals can be found in the care plan section)   Acute Rehab OT Goals Time For Goal Achievement: 08/14/24   OT Frequency:  Min 2X/week       AM-PAC OT 6 Clicks Daily Activity     Outcome Measure Help from another person eating meals?: A Little Help from another person taking care of personal grooming?: A Little Help from another person toileting, which includes using toliet, bedpan, or urinal?: A Lot Help from another person bathing (including washing, rinsing, drying)?: A Lot Help from another person to put on and taking off regular upper body clothing?: A Lot Help from another person to put on and taking off regular lower body clothing?: A Lot 6 Click Score: 14   End of Session Nurse Communication: Mobility status  Activity Tolerance: Patient limited by fatigue Patient left: in bed;with call bell/phone within reach;with bed alarm set  OT Visit Diagnosis: Unsteadiness on feet (R26.81);Repeated falls (R29.6);Muscle weakness (generalized) (M62.81)  Time: 8948-8883 OT Time Calculation (min): 25 min Charges:  OT General Charges $OT Visit: 1 Visit OT Evaluation $OT Re-eval: 1 Re-eval OT Treatments $Therapeutic Exercise: 23-37 mins  [name], MS, OTR/L , CBIS ascom [phone]  07/31/24, 1:02 PM

## 2024-08-01 ENCOUNTER — Other Ambulatory Visit (HOSPITAL_COMMUNITY): Payer: Self-pay

## 2024-08-01 ENCOUNTER — Telehealth (HOSPITAL_COMMUNITY): Payer: Self-pay

## 2024-08-01 DIAGNOSIS — I713 Abdominal aortic aneurysm, ruptured, unspecified: Secondary | ICD-10-CM | POA: Diagnosis not present

## 2024-08-01 DIAGNOSIS — I4891 Unspecified atrial fibrillation: Secondary | ICD-10-CM

## 2024-08-01 DIAGNOSIS — I2602 Saddle embolus of pulmonary artery with acute cor pulmonale: Secondary | ICD-10-CM

## 2024-08-01 DIAGNOSIS — J9601 Acute respiratory failure with hypoxia: Secondary | ICD-10-CM | POA: Diagnosis not present

## 2024-08-01 LAB — BASIC METABOLIC PANEL WITH GFR
Anion gap: 9 (ref 5–15)
BUN: 28 mg/dL — ABNORMAL HIGH (ref 8–23)
CO2: 30 mmol/L (ref 22–32)
Calcium: 8.8 mg/dL — ABNORMAL LOW (ref 8.9–10.3)
Chloride: 103 mmol/L (ref 98–111)
Creatinine, Ser: 1.28 mg/dL — ABNORMAL HIGH (ref 0.44–1.00)
GFR, Estimated: 42 mL/min — ABNORMAL LOW (ref >60)
Glucose, Bld: 116 mg/dL — ABNORMAL HIGH (ref 70–99)
Potassium: 3.7 mmol/L (ref 3.5–5.1)
Sodium: 142 mmol/L (ref 135–145)

## 2024-08-01 LAB — GLUCOSE, CAPILLARY
Glucose-Capillary: 114 mg/dL — ABNORMAL HIGH (ref 70–99)
Glucose-Capillary: 160 mg/dL — ABNORMAL HIGH (ref 70–99)
Glucose-Capillary: 109 mg/dL — ABNORMAL HIGH (ref 70–99)
Glucose-Capillary: 124 mg/dL — ABNORMAL HIGH (ref 70–99)

## 2024-08-01 LAB — CBC
HCT: 26.3 % — ABNORMAL LOW (ref 36.0–46.0)
Hemoglobin: 8.3 g/dL — ABNORMAL LOW (ref 12.0–15.0)
MCH: 28 pg (ref 26.0–34.0)
MCHC: 31.6 g/dL (ref 30.0–36.0)
MCV: 88.9 fL (ref 80.0–100.0)
Platelets: 179 K/uL (ref 150–400)
RBC: 2.96 MIL/uL — ABNORMAL LOW (ref 3.87–5.11)
RDW: 16.5 % — ABNORMAL HIGH (ref 11.5–15.5)
WBC: 12 K/uL — ABNORMAL HIGH (ref 4.0–10.5)
nRBC: 1.7 % — ABNORMAL HIGH (ref 0.0–0.2)

## 2024-08-01 LAB — HEPARIN LEVEL (UNFRACTIONATED)
Heparin Unfractionated: 0.2 [IU]/mL — ABNORMAL LOW (ref 0.30–0.70)
Heparin Unfractionated: 0.31 [IU]/mL (ref 0.30–0.70)

## 2024-08-01 LAB — PHOSPHORUS: Phosphorus: 2.1 mg/dL — ABNORMAL LOW (ref 2.5–4.6)

## 2024-08-01 LAB — MAGNESIUM: Magnesium: 2 mg/dL (ref 1.7–2.4)

## 2024-08-01 MED ORDER — AMIODARONE HCL 200 MG PO TABS
400.0000 mg | ORAL_TABLET | Freq: Two times a day (BID) | ORAL | Status: DC
  Administered 2024-08-01 – 2024-08-06 (×11): 400 mg via ORAL
  Filled 2024-08-01 (×11): qty 2

## 2024-08-01 MED ORDER — HEPARIN BOLUS VIA INFUSION
1300.0000 [IU] | Freq: Once | INTRAVENOUS | Status: AC
  Administered 2024-08-01: 1300 [IU] via INTRAVENOUS
  Filled 2024-08-01: qty 1300

## 2024-08-01 MED ORDER — POTASSIUM CHLORIDE CRYS ER 20 MEQ PO TBCR
40.0000 meq | EXTENDED_RELEASE_TABLET | Freq: Once | ORAL | Status: AC
  Administered 2024-08-01: 40 meq via ORAL
  Filled 2024-08-01: qty 2

## 2024-08-01 MED ORDER — AMIODARONE HCL 200 MG PO TABS: 200.0000 mg | ORAL_TABLET | Freq: Every day | ORAL | Status: DC

## 2024-08-01 MED ORDER — K PHOS MONO-SOD PHOS DI & MONO 155-852-130 MG PO TABS
500.0000 mg | ORAL_TABLET | ORAL | Status: AC
  Administered 2024-08-01 (×2): 500 mg via ORAL
  Filled 2024-08-01 (×2): qty 2

## 2024-08-01 NOTE — Telephone Encounter (Signed)
 Pharmacy Patient Advocate Encounter  Insurance verification completed.    The patient is insured through [employer]. Patient has Medicare and is not eligible for a copay card, but may be able to apply for patient assistance or Medicare RX Payment Plan (Patient Must reach out to their plan, if eligible for payment plan), if available.    Ran test claim for Eliquis 5mg  and the current 30 day co-pay is $0.   This test claim was processed through [employer]- copay amounts may vary at other pharmacies due to [employer], or as the patient moves through the different stages of their insurance plan.

## 2024-08-01 NOTE — Progress Notes (Signed)
 PHARMACY - ANTICOAGULATION CONSULT NOTE  Pharmacy Consult for Heparin  Infusion Indication: pulmonary embolus  Allergies  Allergen Reactions   Codeine     Patient Measurements: Height: 5' 7 (170.2 cm) Weight: 109.9 kg (242 lb 4.6 oz) IBW/kg (Calculated) : 61.6 HEPARIN  DW (KG): 86.1  Vital Signs: Temp: 98.3 F (36.8 C) (11/20 0400) Temp Source: Oral (11/20 0400) BP: 120/53 (11/20 0500) Pulse Rate: 81 (11/20 0500)  Labs: Recent Labs    07/29/24 1741 07/29/24 2019 07/29/24 2019 07/30/24 0429 07/30/24 1134 07/31/24 0421 08/01/24 0500  HGB  --  7.1*   < > 6.9* 8.4* 7.9* 8.3*  HCT  --  22.6*   < > 22.2* 26.1* 24.5* 26.3*  PLT  --  135*   < > 138* 155 150 179  APTT 34  --   --   --   --   --   --   LABPROT 15.2  --   --   --   --   --   --   INR 1.1  --   --   --   --   --   --   HEPARINUNFRC  --   --    < > 0.40 0.50 0.42 0.20*  CREATININE  --  1.30*  --  1.29*  --  1.31*  --    < > = values in this interval not displayed.    Estimated Creatinine Clearance: 44.5 mL/min (A) (by C-G formula based on SCr of 1.31 mg/dL (H)).   Medical History: Past Medical History:  Diagnosis Date   Celiac artery stenosis    CME (cystoid macular edema)    right   CNVM (choroidal neovascular membrane)    Diabetes mellitus without complication (HCC)    Diverticulosis of colon    Elevated lipids    History of cataract    History of rectal bleeding    Hx of adenomatous colonic polyps    Hyperlipidemia    Hypertension    Hypertensive retinopathy of both eyes    Lymphedema    Primary open angle glaucoma (POAG) of both eyes     Medications:  Not on anticoagulation previously  Assessment: Patient is a 78 year old female with a past medical history of peripheral vascular disease, hypertension, type II diabetes who presented to the ED on 07/25/2024 for abdominal pain, found to have ruptured aortic aneurysm that was repaired on 11/13. Patient taken for CTA today and was found to have  a saddle pulmonary embolism extending into segmental branches of all lobes bilaterally. Pharmacy was consulted to initiate patient on a heparin  infusion.  Baseline labs: INR 1.1 aPTT 34s Hgb 7.8 and PLT 140- low but stable No signs/symptoms of bleeding noted in chart  Goal of Therapy:  Heparin  level 0.3-0.7 units/ml Monitor platelets by anticoagulation protocol: Yes   Plan: 11/20: HL @ 0500 = 0.20, SUBtherapeutic - will order Heparin  1300 units IV X 1 bolus and increase drip rate to 1500 units/hr - recheck HL 8 hrs after rate change -- Monitor CBC daily while on heparin    Corrion Stirewalt D, PharmD 08/01/2024,5:55 AM

## 2024-08-01 NOTE — Progress Notes (Signed)
 PHARMACY - ANTICOAGULATION CONSULT NOTE  Pharmacy Consult for Heparin  Infusion Indication: pulmonary embolus  Allergies  Allergen Reactions   Codeine     Patient Measurements: Height: 5' 7 (170.2 cm) Weight: 109.9 kg (242 lb 4.6 oz) IBW/kg (Calculated) : 61.6 HEPARIN  DW (KG): 86.1  Vital Signs: Temp: 97.6 F (36.4 C) (11/20 1159) Temp Source: Oral (11/20 0400) BP: 155/69 (11/20 1159) Pulse Rate: 80 (11/20 1159)  Labs: Recent Labs    07/29/24 1741 07/29/24 2019 07/30/24 0429 07/30/24 1134 07/31/24 0421 08/01/24 0500  HGB  --    < > 6.9* 8.4* 7.9* 8.3*  HCT  --    < > 22.2* 26.1* 24.5* 26.3*  PLT  --    < > 138* 155 150 179  APTT 34  --   --   --   --   --   LABPROT 15.2  --   --   --   --   --   INR 1.1  --   --   --   --   --   HEPARINUNFRC  --    < > 0.40 0.50 0.42 0.20*  CREATININE  --    < > 1.29*  --  1.31* 1.28*   < > = values in this interval not displayed.    Estimated Creatinine Clearance: 45.5 mL/min (A) (by C-G formula based on SCr of 1.28 mg/dL (H)).   Medical History: Past Medical History:  Diagnosis Date   Celiac artery stenosis    CME (cystoid macular edema)    right   CNVM (choroidal neovascular membrane)    Diabetes mellitus without complication (HCC)    Diverticulosis of colon    Elevated lipids    History of cataract    History of rectal bleeding    Hx of adenomatous colonic polyps    Hyperlipidemia    Hypertension    Hypertensive retinopathy of both eyes    Lymphedema    Primary open angle glaucoma (POAG) of both eyes     Medications:  Not on anticoagulation previously  Assessment: Patient is a 78 year old female with a past medical history of peripheral vascular disease, hypertension, type II diabetes who presented to the ED on 07/25/2024 for abdominal pain, found to have ruptured aortic aneurysm that was repaired on 11/13. Patient taken for CTA today and was found to have a saddle pulmonary embolism extending into segmental  branches of all lobes bilaterally. Pharmacy was consulted to initiate patient on a heparin  infusion and monitor/adjust per protocol.  Baseline labs: INR 1.1 aPTT 34s Hgb 7.8 and PLT 140- low but stable No signs/symptoms of bleeding noted in chart  Goal of Therapy:  Heparin  level 0.3-0.7 units/ml Monitor platelets by anticoagulation protocol: Yes   Plan: heparin  level therapeutic x 1 (borderline but trend is up) ---continue IV heparin  infusion at 1500 units/hr ---recheck heparin  level in  8 hrs to confirm -- Monitor CBC daily while on heparin    Adriana JONETTA Bolster, PharmD 08/01/2024,1:39 PM

## 2024-08-01 NOTE — Evaluation (Signed)
 Physical Therapy Evaluation Patient Details Name: Rose Perry MRN: [id number] DOB: 1945-09-22 Today's Date: 08/01/2024  History of Present Illness  Pt is a 78 y/o F admitted on 07/25/24 after presenting with c/o worsening abdominal pain. Imaging was concerned for possible ruptured aneurysm. Pt emergently taken to the vascular lab for repair. Pt also being treated for hemorrhagic shock s/t AAA rupture s/p stent placement, a-fib with RVR. PMH: HTN, DM2, HLD, celiac artery stenosis. Thrombectomy on 11/17.  Clinical Impression  Patient is agreeable to PT session. She was seen for re-evaluation following removal of PE. She needs assistance with mobility today. She was able to stand with rolling walker and take a few side steps. Activity tolerance limited by fatigue. Sp02 briefly down to the low 80's with activity but increases to 90 with rest breaks and cues for breathing techniques. She is fatigued with minimal activity. Consider rehabilitation < 3 hours/day after this hospital stay, however patient prefers to go home. PT will continue to follow.     If plan is discharge home, recommend the following: A little help with walking and/or transfers;A little help with bathing/dressing/bathroom;Assistance with cooking/housework;Help with stairs or ramp for entrance   Can travel by private vehicle   No    Equipment Recommendations None recommended by PT  Recommendations for Other Services       Functional Status Assessment Patient has had a recent decline in their functional status and demonstrates the ability to make significant improvements in function in a reasonable and predictable amount of time.     Precautions / Restrictions Precautions Precautions: Fall Recall of Precautions/Restrictions: Intact Precaution/Restrictions Comments: monitor O2, HR Restrictions Weight Bearing Restrictions Per Provider Order: No      Mobility  Bed Mobility Overal bed mobility: Needs Assistance Bed  Mobility: Sit to Supine, Supine to Sit     Supine to sit: Min assist Sit to supine: Mod assist   General bed mobility comments: increased time required. cues for initiation and sequencing    Transfers Overall transfer level: Needs assistance Equipment used: Rolling walker (2 wheels) Transfers: Sit to/from Stand Sit to Stand: Min assist, +2 physical assistance, From elevated surface                Ambulation/Gait             Pre-gait activities: patient able to take 2-3 side steps to the right with rolling walker with steadying assistance provided.    Stairs            Wheelchair Mobility     Tilt Bed    Modified Rankin (Stroke Patients Only)       Balance Overall balance assessment: Needs assistance Sitting-balance support: Feet supported Sitting balance-Leahy Scale: Good     Standing balance support: Bilateral upper extremity supported, During functional activity Standing balance-Leahy Scale: Fair Standing balance comment: with RE for support in standing                             Pertinent Vitals/Pain Pain Assessment Pain Assessment: No/denies pain    Home Living Family/patient expects to be discharged to:: Private residence Living Arrangements: Children Available Help at Discharge: Family;Available 24 hours/day Type of Home: House Home Access: Stairs to enter Entrance Stairs-Rails: None Entrance Stairs-Number of Steps: 2-3   Home Layout: One level Home Equipment: Cane - single Librarian, Academic (2 wheels)      Prior Function Prior Level of Function : Independent/Modified  Independent             Mobility Comments: Ambulatory with SPC, driving, denies falls. ADLs Comments: Independent     Extremity/Trunk Assessment   Upper Extremity Assessment Upper Extremity Assessment: Generalized weakness    Lower Extremity Assessment Lower Extremity Assessment: Generalized weakness       Communication    Communication Communication: No apparent difficulties    Cognition Arousal: Alert Behavior During Therapy: WFL for tasks assessed/performed   PT - Cognitive impairments: No apparent impairments                         Following commands: Intact       Cueing Cueing Techniques: Verbal cues     General Comments General comments (skin integrity, edema, etc.): Sp02 briefly down to 81, increasing to 85-86, and then eventually back to low 90's. cues for breathing technique. patient is mildly dyspneic with activity but is able to hold a conversation. education on progressing activity with staff assistance at able, sitting up in the recliner chair for upright conditioning    Exercises     Assessment/Plan    PT Assessment Patient needs continued PT services  PT Problem List Decreased strength;Decreased range of motion;Decreased activity tolerance;Decreased balance;Decreased mobility;Cardiopulmonary status limiting activity       PT Treatment Interventions DME instruction;Gait training;Stair training;Functional mobility training;Therapeutic activities;Therapeutic exercise;Balance training;Neuromuscular re-education;Cognitive remediation;Patient/family education    PT Goals (Current goals can be found in the Care Plan section)  Acute Rehab PT Goals Patient Stated Goal: to go home PT Goal Formulation: With patient Time For Goal Achievement: 08/15/24 Potential to Achieve Goals: Fair    Frequency Min 2X/week     Co-evaluation   Reason for Co-Treatment: To address functional/ADL transfers PT goals addressed during session: Mobility/safety with mobility         AM-PAC PT 6 Clicks Mobility  Outcome Measure Help needed turning from your back to your side while in a flat bed without using bedrails?: A Little Help needed moving from lying on your back to sitting on the side of a flat bed without using bedrails?: A Lot Help needed moving to and from a bed to a chair  (including a wheelchair)?: A Lot Help needed standing up from a chair using your arms (e.g., wheelchair or bedside chair)?: A Lot Help needed to walk in hospital room?: A Lot Help needed climbing 3-5 steps with a railing? : A Lot 6 Click Score: 13    End of Session Equipment Utilized During Treatment: Oxygen Activity Tolerance: Patient tolerated treatment well;Patient limited by fatigue Patient left: in bed;with call bell/phone within reach;with bed alarm set Nurse Communication: Mobility status PT Visit Diagnosis: Muscle weakness (generalized) (M62.81);Unsteadiness on feet (R26.81)    Time: 8988-8965 PT Time Calculation (min) (ACUTE ONLY): 23 min   Charges:   PT Evaluation $PT Re-evaluation: 1 Re-eval   PT General Charges $$ ACUTE PT VISIT: 1 Visit        Randine Essex, PT, MPT   Randine LULLA Essex 08/01/2024, 11:35 AM

## 2024-08-01 NOTE — Progress Notes (Signed)
 Occupational Therapy Treatment Patient Details Name: Rose Perry MRN: [id number] DOB: Jul 05, 1946 Today's Date: 08/01/2024   History of present illness Pt is a 78 y/o F admitted on 07/25/24 after presenting with c/o worsening abdominal pain. Imaging was concerned for possible ruptured aneurysm. Pt emergently taken to the vascular lab for repair. Pt also being treated for hemorrhagic shock s/t AAA rupture s/p stent placement, a-fib with RVR. PMH: HTN, DM2, HLD, celiac artery stenosis. Thrombectomy on 11/17.   OT comments  Chart reviewd to date, pt greeted semi supine in bed, agreeable to OT tx session targeting improving functional activity tolerance in prep for ADL tasks. Pt is making progress towards goals and is eager to progress mobility status. Education provided re: pacing and energy conservation techniques for optimal engagement. Pt is left semi supine in bed, all needs met. OT will continue to follow.   Spo2 briefly down to 80-81% on 10 L via HFNC with standing attempts, but pt does not report SOB and quickly up to 85-86% on 10 L via HFNC, 88-89% on 10L via [place] within one minute       If plan is discharge home, recommend the following:  A lot of help with walking and/or transfers;A lot of help with bathing/dressing/bathroom;Assistance with cooking/housework;Assist for transportation;Help with stairs or ramp for entrance   Equipment Recommendations  Other (comment) (defer to next venue of care)    Recommendations for Other Services      Precautions / Restrictions Precautions Precautions: Fall Recall of Precautions/Restrictions: Intact Precaution/Restrictions Comments: monitor O2, HR Restrictions Weight Bearing Restrictions Per Provider Order: No       Mobility Bed Mobility Overal bed mobility: Needs Assistance Bed Mobility: Sit to Supine, Supine to Sit     Supine to sit: Min assist Sit to supine: Mod assist        Transfers Overall transfer level: Needs  assistance Equipment used: Rolling walker (2 wheels) Transfers: Sit to/from Stand Sit to Stand: Min assist, +2 physical assistance, From elevated surface           General transfer comment: three lateral steps up the bed with CGA +1-2 with RW     Balance Overall balance assessment: Needs assistance Sitting-balance support: Feet supported Sitting balance-Leahy Scale: Good     Standing balance support: Bilateral upper extremity supported, During functional activity, Reliant on assistive device for balance Standing balance-Leahy Scale: Fair                             ADL either performed or assessed with clinical judgement   ADL Overall ADL's : Needs assistance/impaired     Grooming: Wash/dry face;Set up               Lower Body Dressing: Maximal assistance Lower Body Dressing Details (indicate cue type and reason): donn socks                    Extremity/Trunk Assessment Upper Extremity Assessment Upper Extremity Assessment: Generalized weakness   Lower Extremity Assessment Lower Extremity Assessment: Generalized weakness        Vision       Perception     Praxis     Communication Communication Communication: No apparent difficulties   Cognition Arousal: Alert Behavior During Therapy: WFL for tasks assessed/performed Cognition: No apparent impairments  Following commands: Intact        Cueing   Cueing Techniques: Verbal cues  Exercises Other Exercises Other Exercises: edu re role of OT, role of rehab, discharge recommendations    Shoulder Instructions       General Comments Sp02 briefly down to 81, increasing to 85-86, and then eventually back to low 90's. cues for breathing technique. patient is mildly dyspneic with activity but is able to hold a conversation. education on progressing activity with staff assistance at able, sitting up in the recliner chair for upright conditioning     Pertinent Vitals/ Pain       Pain Assessment Pain Assessment: No/denies pain  Home Living Family/patient expects to be discharged to:: Private residence Living Arrangements: Children Available Help at Discharge: Family;Available 24 hours/day Type of Home: House Home Access: Stairs to enter [employer] of Steps: 2-3 Entrance Stairs-Rails: None Home Layout: One level     Bathroom Shower/Tub: Chief Strategy Officer: Standard     Home Equipment: Cane - single Librarian, Academic (2 wheels)          Prior Functioning/Environment              Frequency  Min 2X/week        Progress Toward Goals  OT Goals(current goals can now be found in the care plan section)  Progress towards OT goals: Progressing toward goals  Acute Rehab OT Goals Time For Goal Achievement: 08/14/24  Plan      Co-evaluation    PT/OT/SLP Co-Evaluation/Treatment: Yes Reason for Co-Treatment: To address functional/ADL transfers PT goals addressed during session: Mobility/safety with mobility OT goals addressed during session: ADL's and self-care      AM-PAC OT 6 Clicks Daily Activity     Outcome Measure   Help from another person eating meals?: None Help from another person taking care of personal grooming?: None Help from another person toileting, which includes using toliet, bedpan, or urinal?: A Lot Help from another person bathing (including washing, rinsing, drying)?: A Lot Help from another person to put on and taking off regular upper body clothing?: A Little Help from another person to put on and taking off regular lower body clothing?: A Lot 6 Click Score: 17    End of Session    OT Visit Diagnosis: Unsteadiness on feet (R26.81);Repeated falls (R29.6);Muscle weakness (generalized) (M62.81)   Activity Tolerance Patient limited by fatigue   Patient Left in bed;with call bell/phone within reach;with bed alarm set   Nurse Communication Mobility  status        Time: 8988-8965 OT Time Calculation (min): 23 min  Charges: OT General Charges $OT Visit: 1 Visit OT Treatments $Therapeutic Activity: 8-22 mins   Therisa Sheffield, OTD OTR/L  08/01/24, 1:32 PM

## 2024-08-01 NOTE — Progress Notes (Addendum)
 PHARMACY CONSULT NOTE - FOLLOW UP  Pharmacy Consult for Electrolyte Monitoring and Replacement   Recent Labs: Potassium (mmol/L)  Date Value  08/01/2024 3.7  08/23/2013 3.4 (L)   Magnesium  (mg/dL)  Date Value  88/79/7974 2.0   Calcium  (mg/dL)  Date Value  88/79/7974 8.8 (L)   Calcium , Total (mg/dL)  Date Value  87/87/7985 8.2 (L)   Albumin  (g/dL)  Date Value  88/82/7974 3.1 (L)   Phosphorus (mg/dL)  Date Value  88/79/7974 2.1 (L)   Sodium (mmol/L)  Date Value  08/01/2024 142  08/23/2013 137     Assessment: 78 year old female with history of PAD, T2DM, and HTN who presented with abdominal pain found to have a ruptured abdominal aneurysm, admitted with hemorrhagic shock and taken emergency for AAA repair with vascular surgery. Pharmacy is asked to follow and replace electrolytes while in CCU  Diuretics: furosemide  40 mg IV once daily  Goal of Therapy:  Potassium 4.0 - 5.1 mmol/L Magnesium  2.0 - 2.4 mg/dL All Other Electrolytes WNL  Plan:  Kphos 2 tabs x 1 KCL 40 mEq x 1 Pharmacy will sign off as pt transferred from ICU. Please re-consult.   Cathaleen GORMAN Blanch ,PharmD Clinical Pharmacist 08/01/2024 9:15 AM

## 2024-08-01 NOTE — Progress Notes (Signed)
 [facility]  Daily Progress Note   Subjective  -   Patient states she is feeling better.  Still requiring oxygen but her respiratory status is improved subjectively.  No major events overnight.  Objective Vitals:   08/01/24 0600 08/01/24 0700 08/01/24 0732 08/01/24 0800  BP: 117/65 132/68  (!) 113/56  Pulse: 79 84  80  Resp: 17 (!) 23  (!) 22  Temp:      TempSrc:      SpO2: 99% 94% 99% 92%  Weight:      Height:        Intake/Output Summary (Last 24 hours) at 08/01/2024 0835 Last data filed at 08/01/2024 0700 Gross per 24 hour  Intake 1368.37 ml  Output 3005 ml  Net -1636.63 ml    PULM  remains slightly tachypneic with diminished breath sounds in the right lower lobe CV  RRR VASC  access sites are clean, dry, and intact.  Feet are warm.  Laboratory CBC    Component Value Date/Time   WBC 12.0 (H) 08/01/2024 0500   HGB 8.3 (L) 08/01/2024 0500   HGB 11.9 (L) 08/22/2013 0455   HCT 26.3 (L) 08/01/2024 0500   HCT 43.8 08/07/2013 0925   PLT 179 08/01/2024 0500   PLT 217 08/22/2013 0455    BMET    Component Value Date/Time   NA 142 08/01/2024 0500   NA 137 08/23/2013 0519   K 3.7 08/01/2024 0500   K 3.4 (L) 08/23/2013 0519   CL 103 08/01/2024 0500   CL 103 08/23/2013 0519   CO2 30 08/01/2024 0500   CO2 30 08/23/2013 0519   GLUCOSE 116 (H) 08/01/2024 0500   GLUCOSE 168 (H) 08/23/2013 0519   BUN 28 (H) 08/01/2024 0500   BUN 12 08/23/2013 0519   CREATININE 1.28 (H) 08/01/2024 0500   CREATININE 1.17 08/23/2013 0519   CALCIUM 8.8 (L) 08/01/2024 0500   CALCIUM 8.2 (L) 08/23/2013 0519   GFRNONAA 42 (L) 08/01/2024 0500   GFRNONAA 48 (L) 08/23/2013 0519   GFRAA 55 (L) 08/23/2013 0519    Assessment/Planning: POD #7 s/p ruptured AAA repair.   Overall doing well.  Respiratory status appears improved. Continue heparin  anticoagulation and at some point can transition over to Eliquis No signs of ongoing bleeding Increase activity and better  pulmonary toilet will be helpful   Selinda Gu  08/01/2024, 8:35 AM

## 2024-08-01 NOTE — Plan of Care (Signed)
  Problem: Education: Goal: Knowledge of General Education information will improve Description: Including pain rating scale, medication(s)/side effects and non-pharmacologic comfort measures Outcome: Progressing   Problem: Health Behavior/Discharge Planning: Goal: Ability to manage health-related needs will improve Outcome: Progressing   Problem: Clinical Measurements: Goal: Ability to maintain clinical measurements within normal limits will improve Outcome: Progressing Goal: Will remain free from infection Outcome: Progressing Goal: Diagnostic test results will improve Outcome: Progressing Goal: Respiratory complications will improve Outcome: Progressing   Problem: Nutrition: Goal: Adequate nutrition will be maintained Outcome: Progressing   

## 2024-08-01 NOTE — Progress Notes (Signed)
 [facility] CLINIC CARDIOLOGY PROGRESS NOTE       Patient ID: Rose Perry MRN: [id number] DOB/AGE: 1945/09/17 79 y.o.  Admit date: 07/25/2024 Referring Physician Robet Kim, PA - PCCM Primary Physician Marikay Eva POUR, PA  Primary Cardiologist None Reason for Consultation Decompensated respiratory status, elevated troponin  HPI: Rose Perry is a 78 y.o. female  with a past medical history of peripheral vascular disease, hypertension, type II diabetes who presented to the ED on 07/25/2024 for abdominal pain, found to have ruptured aortic aneurysm. Underwent repair on 07/25/2024. Over the weekend into this AM she developed worsening respiratory status. Troponin noted to be elevated. Cardiology was consulted for further evaluation.   Interval history: -Patient seen and examined this AM, resting comfortably in hospital bed on supplemental O2.  -Underwent pulmonary thrombectomy 11/17.  Remains on IV heparin . -BP and HR stable this AM.   Review of systems complete and found to be negative unless listed above    Past Medical History:  Diagnosis Date   Celiac artery stenosis    CME (cystoid macular edema)    right   CNVM (choroidal neovascular membrane)    Diabetes mellitus without complication (HCC)    Diverticulosis of colon    Elevated lipids    History of cataract    History of rectal bleeding    Hx of adenomatous colonic polyps    Hyperlipidemia    Hypertension    Hypertensive retinopathy of both eyes    Lymphedema    Primary open angle glaucoma (POAG) of both eyes     Past Surgical History:  Procedure Laterality Date   ABDOMINAL HYSTERECTOMY     CENTRAL LINE INSERTION  07/29/2024   Procedure: CENTRAL LINE INSERTION;  Surgeon: Jama Cordella [name], MD;  Location: [facility] INVASIVE CV LAB;  Service: Cardiovascular;;   COLONOSCOPY     COLONOSCOPY WITH PROPOFOL  N/A 08/22/2016   Procedure: COLONOSCOPY WITH PROPOFOL ;  Surgeon: Lamar ONEIDA Holmes, MD;  Location: [facility]  ENDOSCOPY;  Service: Endoscopy;  Laterality: N/A;   COLONOSCOPY WITH PROPOFOL  N/A 08/11/2020   Procedure: COLONOSCOPY WITH PROPOFOL ;  Surgeon: Maryruth Ole ONEIDA, MD;  Location: [facility] ENDOSCOPY;  Service: Endoscopy;  Laterality: N/A;   COLONOSCOPY WITH PROPOFOL  N/A 11/12/2021   Procedure: COLONOSCOPY WITH PROPOFOL ;  Surgeon: Maryruth Ole ONEIDA, MD;  Location: [facility] ENDOSCOPY;  Service: Endoscopy;  Laterality: N/A;  DM   COLONOSCOPY WITH PROPOFOL  N/A 08/18/2023   Procedure: COLONOSCOPY WITH PROPOFOL ;  Surgeon: Maryruth Ole ONEIDA, MD;  Location: [facility] ENDOSCOPY;  Service: Endoscopy;  Laterality: N/A;   ENDOVASCULAR STENT GRAFT (AAA) N/A 07/25/2024   Procedure: ENDOVASCULAR STENT GRAFT (AAA);  Surgeon: Jama Cordella [name], MD;  Location: [facility] INVASIVE CV LAB;  Service: Cardiovascular;  Laterality: N/A;  ruptured AAA   ENDOVASCULAR STENT GRAFT REPAIR  07/25/2024   Procedure: ENDOVASCULAR STENT GRAFT REPAIR;  Surgeon: Jama Cordella [name], MD;  Location: [facility] INVASIVE CV LAB;  Service: Cardiovascular;;   ESOPHAGOGASTRODUODENOSCOPY (EGD) WITH PROPOFOL  N/A 08/11/2020   Procedure: ESOPHAGOGASTRODUODENOSCOPY (EGD) WITH PROPOFOL ;  Surgeon: Maryruth Ole ONEIDA, MD;  Location: [facility] ENDOSCOPY;  Service: Endoscopy;  Laterality: N/A;   EYE SURGERY     IVC FILTER INSERTION N/A 07/29/2024   Procedure: IVC FILTER INSERTION;  Surgeon: Jama Cordella [name], MD;  Location: [facility] INVASIVE CV LAB;  Service: Cardiovascular;  Laterality: N/A;   JOINT REPLACEMENT Left    left total knee replacement   LOWER EXTREMITY ANGIOGRAPHY Right 02/20/2024   Procedure: Lower Extremity Angiography;  Surgeon: Jama Cordella [name],  MD;  Location: [facility] INVASIVE CV LAB;  Service: Cardiovascular;  Laterality: Right;   ltkr     needle in foot removed     POLYPECTOMY  08/18/2023   Procedure: POLYPECTOMY;  Surgeon: Maryruth Ole DASEN, MD;  Location: [facility] ENDOSCOPY;  Service: Endoscopy;;   PULMONARY THROMBECTOMY Bilateral 07/29/2024   Procedure:  PULMONARY THROMBECTOMY;  Surgeon: Jama Cordella [name], MD;  Location: [facility] INVASIVE CV LAB;  Service: Cardiovascular;  Laterality: Bilateral;   REFRACTIVE SURGERY      Medications Prior to Admission  Medication Sig Dispense Refill Last Dose/Taking   amLODipine (NORVASC) 5 MG tablet Take 1 tablet by mouth daily.   07/24/2024   aspirin  EC 81 MG tablet Take 1 tablet (81 mg total) by mouth daily. Swallow whole. 150 tablet 1 07/24/2024   atorvastatin (LIPITOR) 20 MG tablet Take 1 tablet by mouth daily.   07/24/2024   clopidogrel  (PLAVIX ) 75 MG tablet Take 1 tablet (75 mg total) by mouth daily. 30 tablet 4 07/24/2024 Morning   metFORMIN (GLUCOPHAGE) 500 MG tablet Take 1 tablet by mouth 2 (two) times daily with a meal.   07/24/2024   metoprolol  tartrate (LOPRESSOR ) 25 MG tablet Take 0.5 tablets (12.5 mg total) by mouth 2 (two) times daily. 30 tablet 0 07/24/2024   pantoprazole  (PROTONIX ) 40 MG tablet Take 1 tablet (40 mg total) by mouth daily. 30 tablet 0 07/24/2024   polyethylene glycol (MIRALAX ) 17 g packet Take 17 g by mouth 2 (two) times daily. 60 each 0 Taking   potassium chloride  (KLOR-CON ) 10 MEQ tablet Take 10 mEq by mouth daily.   07/24/2024   timolol (TIMOPTIC) 0.5 % ophthalmic solution Place 1 drop into both eyes 2 (two) times daily.   07/24/2024   triamterene-hydrochlorothiazide (MAXZIDE-25) 37.5-25 MG tablet Take 1 tablet by mouth daily.   07/24/2024   dorzolamide-timolol (COSOPT) 2-0.5 % ophthalmic solution Place 1 drop into both eyes 2 (two) times daily.   Unknown   Multiple Vitamins-Minerals (SENTRY PO) Take by mouth daily.   Unknown   Social History   Socioeconomic History   Marital status: Divorced    Spouse name: Not on file   Number of children: Not on file   Years of education: Not on file   Highest education level: Not on file  Occupational History   Not on file  Tobacco Use   Smoking status: Former   Smokeless tobacco: Never  Vaping Use   Vaping status: Never Used   Substance and Sexual Activity   Alcohol use: No   Drug use: No   Sexual activity: Not Currently  Other Topics Concern   Not on file  Social History Narrative   There are several medical conditions patient states she has never had. This RN was deleting some when the patient asked me to stop so she could have list. The ones I deleted are AAA, arthritis, and anemia.   Social Drivers of Corporate Investment Banker Strain: Low Risk  (01/18/2023)   Received from [facility]   Overall Financial Resource Strain (CARDIA)    Difficulty of Paying Living Expenses: Not hard at all  Food Insecurity: Patient Unable To Answer (07/25/2024)   Hunger Vital Sign    Worried About Running Out of Food in the Last Year: Patient unable to answer    Ran Out of Food in the Last Year: Patient unable to answer  Transportation Needs: Patient Unable To Answer (07/25/2024)   PRAPARE - Transportation  Lack of Transportation (Medical): Patient unable to answer    Lack of Transportation (Non-Medical): Patient unable to answer  Physical Activity: Not on file  Stress: Not on file  Social Connections: Patient Unable To Answer (07/25/2024)   Social Connection and Isolation Panel    Frequency of Communication with Friends and Family: Patient unable to answer    Frequency of Social Gatherings with Friends and Family: Patient unable to answer    Attends Religious Services: Patient unable to answer    Active Member of Clubs or Organizations: Patient unable to answer    Attends Banker Meetings: Patient unable to answer    Marital Status: Patient unable to answer  Intimate Partner Violence: Patient Unable To Answer (07/25/2024)   Humiliation, Afraid, Rape, and Kick questionnaire    Fear of Current or Ex-Partner: Patient unable to answer    Emotionally Abused: Patient unable to answer    Physically Abused: Patient unable to answer    Sexually Abused: Patient unable to answer    Family  History  Problem Relation Age of Onset   Hypertension Mother    Hyperlipidemia Mother    Hypertension Father    Coronary artery disease Brother    Breast cancer Neg Hx      Vitals:   08/01/24 0600 08/01/24 0700 08/01/24 0732 08/01/24 0800  BP: 117/65 132/68  (!) 113/56  Pulse: 79 84  80  Resp: 17 (!) 23  (!) 22  Temp:      TempSrc:      SpO2: 99% 94% 99% 92%  Weight:      Height:        PHYSICAL EXAM General: Ill appearing female, well nourished, in no acute distress. HEENT: Normocephalic and atraumatic. Neck: No JVD.  Lungs: Normal respiratory effort on HFNC.  Heart: HRRR. Normal S1 and S2 without gallops or murmurs.  Abdomen: Non-distended appearing.  Msk: Normal strength and tone for age. Extremities: Warm and well perfused. No clubbing, cyanosis. No edema.  Neuro: Alert and oriented X 3. Psych: Answers questions appropriately.   Labs: Basic Metabolic Panel: Recent Labs    07/31/24 0421 07/31/24 2245 08/01/24 0500  NA 140  --  142  K 3.3* 3.9 3.7  CL 104  --  103  CO2 28  --  30  GLUCOSE 120*  --  116*  BUN 30*  --  28*  CREATININE 1.31*  --  1.28*  CALCIUM 8.5*  --  8.8*  MG 2.1  --  2.0  PHOS 2.5  --  2.1*   Liver Function Tests: Recent Labs    07/29/24 2019  AST 99*  ALT 27  ALKPHOS 60  BILITOT 0.6  PROT 5.4*  ALBUMIN 3.1*   No results for input(s): LIPASE, AMYLASE in the last 72 hours. CBC: Recent Labs    07/31/24 0421 08/01/24 0500  WBC 11.8* 12.0*  HGB 7.9* 8.3*  HCT 24.5* 26.3*  MCV 88.8 88.9  PLT 150 179   Cardiac Enzymes: No results for input(s): CKTOTAL, CKMB, CKMBINDEX, TROPONINIHS in the last 72 hours. BNP: No results for input(s): BNP in the last 72 hours. D-Dimer: No results for input(s): DDIMER in the last 72 hours. Hemoglobin A1C: No results for input(s): HGBA1C in the last 72 hours. Fasting Lipid Panel: No results for input(s): CHOL, HDL, LDLCALC, TRIG, CHOLHDL, LDLDIRECT in the last  72 hours. Thyroid Function Tests: No results for input(s): TSH, T4TOTAL, T3FREE, THYROIDAB in the last 72 hours.  Invalid  input(s): FREET3 Anemia Panel: No results for input(s): VITAMINB12, FOLATE, FERRITIN, TIBC, IRON , RETICCTPCT in the last 72 hours.   Radiology: [facility] Chest Port 1 View Result Date: 07/31/2024 EXAM: 1 VIEW(S) XRAY OF THE CHEST 07/31/2024 04:11:00 AM COMPARISON: 07/29/2024 CLINICAL HISTORY: Shortness of breath FINDINGS: LINES, TUBES AND DEVICES: Right IJ CVC in place with tip overlying right atrium. LUNGS AND PLEURA: Diffuse interstitial prominence with patchy bibasilar opacities. Small right pleural effusion. No pneumothorax. HEART AND MEDIASTINUM: Stable cardiomegaly. Aortic atherosclerosis. BONES AND SOFT TISSUES: No acute osseous abnormality. IMPRESSION: 1. Diffuse interstitial prominence with patchy bibasilar opacities, which may reflect edema or infection, and a small right pleural effusion. Electronically signed by: Waddell Calk MD 07/31/2024 05:58 AM EST RP Workstation: [id number]   US  Venous Img Lower Bilateral (DVT) Result Date: 07/30/2024 CLINICAL DATA:  Pulmonary embolism. EXAM: BILATERAL LOWER EXTREMITY VENOUS DOPPLER ULTRASOUND TECHNIQUE: Gray-scale sonography with graded compression, as well as color Doppler and duplex ultrasound were performed to evaluate the lower extremity deep venous systems from the level of the common femoral vein and including the common femoral, femoral, profunda femoral, popliteal and calf veins including the posterior tibial, peroneal and gastrocnemius veins when visible. The superficial great saphenous vein was also interrogated. Spectral Doppler was utilized to evaluate flow at rest and with distal augmentation maneuvers in the common femoral, femoral and popliteal veins. COMPARISON:  None Available. FINDINGS: RIGHT LOWER EXTREMITY Common Femoral Vein: Small amount of echogenic nonocclusive thrombus. Saphenofemoral  Junction: No evidence of thrombus. Normal compressibility and flow on color Doppler imaging. Profunda Femoral Vein: No evidence of thrombus. Normal compressibility and flow on color Doppler imaging. Femoral Vein: No evidence of thrombus. Normal compressibility, respiratory phasicity and response to augmentation. Popliteal Vein: Nonocclusive thrombus in the mid popliteal vein. Calf Veins: Nonocclusive thrombus visualized in posterior tibial and peroneal veins. Superficial Great Saphenous Vein: No evidence of thrombus. Normal compressibility. Venous Reflux:  None. Other Findings: No evidence of superficial thrombophlebitis or abnormal fluid collection. LEFT LOWER EXTREMITY Common Femoral Vein: No evidence of thrombus. Normal compressibility, respiratory phasicity and response to augmentation. Saphenofemoral Junction: No evidence of thrombus. Normal compressibility and flow on color Doppler imaging. Profunda Femoral Vein: No evidence of thrombus. Normal compressibility and flow on color Doppler imaging. Femoral Vein: No evidence of thrombus. Normal compressibility, respiratory phasicity and response to augmentation. Popliteal Vein: No evidence of thrombus. Normal compressibility, respiratory phasicity and response to augmentation. Calf Veins: No evidence of thrombus. Normal compressibility and flow on color Doppler imaging. Superficial Great Saphenous Vein: No evidence of thrombus. Normal compressibility. Venous Reflux:  None. Other Findings: No evidence of superficial thrombophlebitis or abnormal fluid collection. IMPRESSION: Small amount of nonocclusive thrombus in the right common femoral vein. Nonocclusive thrombus in the right popliteal, posterior tibial and peroneal veins. Electronically Signed   By: Marcey Moan M.D.   On: 07/30/2024 11:52   PERIPHERAL VASCULAR CATHETERIZATION Result Date: 07/29/2024 See surgical note for result.  CARDIAC CATHETERIZATION Result Date: 07/29/2024 See surgical note for  result.  CT Angio Chest Pulmonary Embolism (PE) W or WO Contrast Result Date: 07/29/2024 CLINICAL DATA:  Acute respiratory failure EXAM: CT ANGIOGRAPHY CHEST WITH CONTRAST TECHNIQUE: Multidetector CT imaging of the chest was performed using the standard protocol during bolus administration of intravenous contrast. Multiplanar CT image reconstructions and MIPs were obtained to evaluate the vascular anatomy. RADIATION DOSE REDUCTION: This exam was performed according to the departmental dose-optimization program which includes automated exposure control, adjustment of the mA and/or kV according to  patient size and/or use of iterative reconstruction technique. CONTRAST:  75mL OMNIPAQUE  IOHEXOL  350 MG/ML SOLN COMPARISON:  CT chest 07/28/2024. FINDINGS: Cardiovascular: There is adequate opacification of the pulmonary arteries. A saddle pulmonary embolism is identified extending into segmental branches of all lobes bilaterally. The heart is moderately enlarged. There is a small pericardial effusion. Aorta is normal in size. There are atherosclerotic calcifications of the aorta. Mediastinum/Nodes: No enlarged mediastinal, hilar, or axillary lymph nodes. Thyroid gland, trachea, and esophagus demonstrate no significant findings. Lungs/Pleura: Mild emphysema present. There is a stable 1 cm left upper lobe pulmonary nodule. Trace left and small right pleural effusions are present. There is right lower lobe airspace consolidation and collapse. There is no pneumothorax. Upper Abdomen: There is moderate ascites in the upper abdomen. Musculoskeletal: No chest wall abnormality. No acute or significant osseous findings. Review of the MIP images confirms the above findings. IMPRESSION: 1. Saddle pulmonary embolism extending into segmental branches of all lobes bilaterally. Positive for acute PE with CTevidence of right heart strain (RV/LV Ratio = 1.2) consistent with at least submassive (intermediate risk) PE. The presence of  right heart strain has been associated with an increased risk of morbidity and mortality. 2. Moderate cardiomegaly. 3. Small pericardial effusion. 4. Trace left and small right pleural effusions. 5. Right lower lobe airspace consolidation and collapse. 6. Moderate ascites in the upper abdomen. Aortic Atherosclerosis (ICD10-I70.0). Electronically Signed   By: Greig Pique M.D.   On: 07/29/2024 15:58   DG Chest Port 1 View Result Date: 07/29/2024 CLINICAL DATA:  Dyspnea. EXAM: PORTABLE CHEST 1 VIEW COMPARISON:  07/28/2024.  Chest CT dated 07/28/2024. FINDINGS: Poor inspiration. Stable mildly enlarged cardiac silhouette. Mildly increased patchy density at the right lung base. Minimally improved left lower lobe atelectasis. Thoracic spine degenerative changes. IMPRESSION: 1. Mildly increased patchy atelectasis or pneumonia at the right lung base. 2. Minimally improved left lower lobe atelectasis. 3. Stable mild cardiomegaly. Electronically Signed   By: Elspeth Bathe M.D.   On: 07/29/2024 14:20   ECHOCARDIOGRAM COMPLETE Result Date: 07/29/2024    ECHOCARDIOGRAM REPORT   Patient Name:   DAURICE OVANDO Date of Exam: 07/29/2024 Medical Rec #:  [id number]      Height:       67.0 in Accession #:    [id number]     Weight:       236.3 lb Date of Birth:  06/21/1946     BSA:          2.171 m Patient Age:    78 years       BP:           116/59 mmHg Patient Gender: F              HR:           103 bpm. Exam Location:  [facility] Procedure: 2D Echo, Cardiac Doppler and Color Doppler (Both Spectral and Color            Flow Doppler were utilized during procedure). Indications:     Dyspnea R06.00  History:         Patient has no prior history of Echocardiogram examinations.                  Risk Factors:Diabetes and Hypertension.  Sonographer:     Christopher Furnace Referring Phys:  [id number] MAGDALENE S TUKOV-YUAL Diagnosing Phys: Dwayne D Callwood MD IMPRESSIONS  1. Left ventricular ejection fraction, by estimation, is 65 to 70%. The left  ventricle has normal function. The left ventricle has no regional wall motion abnormalities. There is moderate asymmetric left ventricular hypertrophy. Left ventricular diastolic parameters are consistent with Grade I diastolic dysfunction (impaired relaxation).  2. Right ventricular systolic function is mildly reduced. The right ventricular size is moderately enlarged.  3. Left atrial size was mildly dilated.  4. Right atrial size was mildly dilated.  5. The mitral valve is normal in structure. Trivial mitral valve regurgitation.  6. The aortic valve is normal in structure. Aortic valve regurgitation is trivial. Aortic valve sclerosis/calcification is present, without any evidence of aortic stenosis. FINDINGS  Left Ventricle: Left ventricular ejection fraction, by estimation, is 65 to 70%. The left ventricle has normal function. The left ventricle has no regional wall motion abnormalities. Strain was performed and the global longitudinal strain is indeterminate. The left ventricular internal cavity size was normal in size. There is moderate asymmetric left ventricular hypertrophy. Left ventricular diastolic parameters are consistent with Grade I diastolic dysfunction (impaired relaxation). Right Ventricle: The right ventricular size is moderately enlarged. No increase in right ventricular wall thickness. Right ventricular systolic function is mildly reduced. Left Atrium: Left atrial size was mildly dilated. Right Atrium: Right atrial size was mildly dilated. Pericardium: There is no evidence of pericardial effusion. Mitral Valve: The mitral valve is normal in structure. Trivial mitral valve regurgitation. MV peak gradient, 5.3 mmHg. The mean mitral valve gradient is 2.0 mmHg. Tricuspid Valve: The tricuspid valve is normal in structure. Tricuspid valve regurgitation is trivial. Aortic Valve: The aortic valve is normal in structure. Aortic valve regurgitation is trivial. Aortic valve sclerosis/calcification is  present, without any evidence of aortic stenosis. Aortic valve mean gradient measures 4.0 mmHg. Aortic valve peak gradient measures 8.3 mmHg. Aortic valve area, by VTI measures 2.62 cm. Pulmonic Valve: The pulmonic valve was normal in structure. Pulmonic valve regurgitation is not visualized. Aorta: The ascending aorta was not well visualized. IAS/Shunts: No atrial level shunt detected by color flow Doppler. Additional Comments: 3D was performed not requiring image post processing on an independent workstation and was indeterminate.  LEFT VENTRICLE PLAX 2D LVIDd:         4.70 cm   Diastology LVIDs:         2.70 cm   LV e' medial:    5.33 cm/s LV PW:         1.00 cm   LV E/e' medial:  10.3 LV IVS:        1.40 cm   LV e' lateral:   7.07 cm/s LVOT diam:     2.10 cm   LV E/e' lateral: 7.8 LV SV:         48 LV SV Index:   22 LVOT Area:     3.46 cm LV IVRT:       77 msec  RIGHT VENTRICLE RV Basal diam:  4.20 cm     PULMONARY VEINS RV Mid diam:    3.60 cm     Diastolic Velocity: 17.00 cm/s RV S prime:     13.40 cm/s  S/D Velocity:       2.60 TAPSE (M-mode): 1.8 cm      Systolic Velocity:  43.40 cm/s LEFT ATRIUM           Index        RIGHT ATRIUM           Index LA diam:      4.00 cm 1.84 cm/m   RA Area:  16.80 cm LA Vol (A2C): 31.4 ml 14.46 ml/m  RA Volume:   45.30 ml  20.87 ml/m LA Vol (A4C): 88.4 ml 40.72 ml/m  AORTIC VALVE AV Area (Vmax):    2.50 cm AV Area (Vmean):   2.35 cm AV Area (VTI):     2.62 cm AV Vmax:           144.00 cm/s AV Vmean:          96.100 cm/s AV VTI:            0.184 m AV Peak Grad:      8.3 mmHg AV Mean Grad:      4.0 mmHg LVOT Vmax:         104.00 cm/s LVOT Vmean:        65.200 cm/s LVOT VTI:          0.139 m LVOT/AV VTI ratio: 0.76  AORTA Ao Root diam: 3.20 cm MITRAL VALVE               TRICUSPID VALVE MV Area (PHT): 3.76 cm    TR Peak grad:   44.6 mmHg MV Area VTI:   2.56 cm    TR Vmax:        334.00 cm/s MV Peak grad:  5.3 mmHg MV Mean grad:  2.0 mmHg    SHUNTS MV Vmax:        1.15 m/s    Systemic VTI:  0.14 m MV Vmean:      69.7 cm/s   Systemic Diam: 2.10 cm MV Decel Time: 202 msec MV E velocity: 55.00 cm/s MV A velocity: 96.40 cm/s MV E/A ratio:  0.57 Dwayne D Callwood MD Electronically signed by Cara JONETTA Lovelace MD Signature Date/Time: 07/29/2024/1:48:43 PM    Final    CT CHEST WO CONTRAST Result Date: 07/28/2024 EXAM: CT CHEST WITHOUT CONTRAST 07/28/2024 06:19:00 PM TECHNIQUE: CT of the chest was performed without the administration of intravenous contrast. Multiplanar reformatted images are provided for review. Automated exposure control, iterative reconstruction, and/or weight based adjustment of the mA/kV was utilized to reduce the radiation dose to as low as reasonably achievable. COMPARISON: None available. CLINICAL HISTORY: Pneumonia, complication suspected, xray done; Respiratory illness, nondiagnostic xray. FINDINGS: MEDIASTINUM: Small pericardial effusion. Atheromatous calcifications of aorta and coronary arteries. The central airways are clear. LYMPH NODES: No mediastinal, hilar or axillary lymphadenopathy. LUNGS AND PLEURA: Irregularly marginated 1.3 cm pleural-based left apical nodule. Atelectasis or dense alveolar consolidation of the right lower lobe. Small right-sided pleural effusion. No pneumothorax. SOFT TISSUES/BONES: Thoracic degenerative disc disease. No acute abnormality of the soft tissues. UPPER ABDOMEN: Limited images of the upper abdomen demonstrate ascites. IMPRESSION: 1. Atelectasis or dense alveolar consolidation of the right lower lobe 2. Irregularly marginated 1.3 cm (13 mm) pleural-based left apical pulmonary nodule; per Fleischner Society Guidelines for a single solid nodule 8.120 mm, recommend further evaluation with non-contrast chest CT at 3 months, PET/CT, or tissue sampling depending on clinical risk and morphology 3. Small right-sided pleural effusion 4. Small pericardial effusion Electronically signed by: Fonda Field MD 07/28/2024  06:27 PM EST RP Workstation: [id number]   DG Chest 1 View Result Date: 07/28/2024 CLINICAL DATA:  Shortness of breath. Abdominal aortic aneurysm rupture and repair. EXAM: CHEST  1 VIEW COMPARISON:  07/27/2024. FINDINGS: The heart size and mediastinal contours are stable. There is atherosclerotic calcification aorta. Lung volumes are low. Hazy opacity is noted at the right lung base. There is mild atelectasis at the left lung base.  No pneumothorax is seen. No acute osseous abnormality. IMPRESSION: 1. Hazy opacity at the right lung base, possible atelectasis, infiltrate, and or pleural effusion. 2. Mild atelectasis at the left lung base. Electronically Signed   By: Leita Birmingham M.D.   On: 07/28/2024 14:52   DG Chest Port 1 View Result Date: 07/27/2024 CLINICAL DATA:  Respiratory failure with hypoxia. EXAM: PORTABLE CHEST 1 VIEW COMPARISON:  07/26/2024 FINDINGS: Lungs are hypoinflated with mild hazy bibasilar opacification likely due to atelectasis and possible small amount of bilateral pleural fluid. Infection in the lung bases is possible. Borderline stable cardiomegaly. Remainder of the exam is unchanged. IMPRESSION: Hypoinflation with mild hazy bibasilar opacification likely due to atelectasis and possible small amount of bilateral pleural fluid. Infection in the lung bases is possible. Electronically Signed   By: Toribio Agreste M.D.   On: 07/27/2024 11:15   DG Chest Port 1 View Result Date: 07/26/2024 CLINICAL DATA:  Respiratory with hypoxia. EXAM: PORTABLE CHEST 1 VIEW COMPARISON:  Radiographs 07/25/2024.  Abdominal CT 07/25/2024. FINDINGS: 0813 hours. Tip of the endotracheal tube is 5.7 cm above the carina. Enteric tube has been advanced with the side hole below the gastroesophageal junction. Right IJ central venous catheter projects to the inferior right atrium. Persistent low lung volumes with patchy opacities at both lung bases, likely atelectasis. Stable mild blunting of the left costophrenic  angle without significant pleural effusion on recent CT. Stable cardiomegaly and mediastinal contours. IMPRESSION: 1. Interval advancement of enteric tube with side hole below the gastroesophageal junction. 2. No other significant changes. Persistent low lung volumes with bibasilar atelectasis. Electronically Signed   By: Elsie Perone M.D.   On: 07/26/2024 12:05   PERIPHERAL VASCULAR CATHETERIZATION Result Date: 07/25/2024 See surgical note for result.  DG Abd 1 View Result Date: 07/25/2024 CLINICAL DATA:  OG placement EXAM: ABDOMEN - 1 VIEW COMPARISON:  CT 07/25/2024 FINDINGS: Right-sided central venous catheter tip at the low right atrium. Enteric tube tip folded back upon itself in the region of the stomach. Nonobstructed gas pattern. Aortic stent graft is noted. IMPRESSION: Enteric tube tip folded back upon itself in the region of the stomach. Electronically Signed   By: Luke Bun M.D.   On: 07/25/2024 16:30   DG Chest Port 1 View Result Date: 07/25/2024 CLINICAL DATA:  Intubated central line EXAM: PORTABLE CHEST 1 VIEW COMPARISON:  None Available. FINDINGS: Endotracheal tube tip is about 4.5 cm superior to the carina. Enteric tube tip at the level of distal esophagus, side-port in the region of mid esophagus. Right IJ central venous catheter tip at the low right atrium. Hypoventilatory changes. Mild cardiomegaly. Possible small left effusion. Suspect patchy atelectasis at the left base. IMPRESSION: 1. Endotracheal tube tip about 4.5 cm superior to the carina. 2. Enteric tube tip at the level of distal esophagus, side-port in the region of mid esophagus. Recommend advancement. See separately dictated abdominal radiograph 3. Right IJ central venous catheter tip at the low right atrium. 4. Hypoventilatory changes with possible small left effusion and patchy atelectasis at the left base. Electronically Signed   By: Luke Bun M.D.   On: 07/25/2024 16:29   CT ABDOMEN PELVIS W CONTRAST Result  Date: 07/25/2024 CLINICAL DATA:  Acute right flank pain with associated vomiting. History of abdominal aortic aneurysm measuring approximately 4.5 x 4.9 cm in dimensions by CTA in 2021. EXAM: CT ABDOMEN AND PELVIS WITH CONTRAST TECHNIQUE: Multidetector CT imaging of the abdomen and pelvis was performed using the standard protocol  following bolus administration of intravenous contrast. RADIATION DOSE REDUCTION: This exam was performed according to the departmental dose-optimization program which includes automated exposure control, adjustment of the mA and/or kV according to patient size and/or use of iterative reconstruction technique. CONTRAST:  OMNIPAQUE  IOHEXOL  300 MG/ML  SOLN COMPARISON:  CTA abdomen pelvis 08/10/2020 FINDINGS: Lower chest: No acute abnormality. Hepatobiliary: No focal liver abnormality is seen. No gallstones, gallbladder wall thickening, or biliary dilatation. Pancreas: Unremarkable. No pancreatic ductal dilatation or surrounding inflammatory changes. Spleen: Normal in size without focal abnormality. Adrenals/Urinary Tract: Adrenal glands are unremarkable. Kidneys are normal, without renal calculi, focal lesion, or hydronephrosis. Bladder is unremarkable. Stomach/Bowel: Bowel shows no evidence of obstruction, ileus, inflammation or lesion. The appendix is normal. No free intraperitoneal air. Vascular/Lymphatic: No lymphadenopathy. Significant interval enlargement infrarenal abdominal aortic aneurysm now measuring approximately 5.7 x 6.9 cm in maximum transverse dimensions with maximum oblique diameter of 7.0 cm. There is evidence to suggest acute focal leak from the right anterior aspect of the aneurysm with retroperitoneal stranding anterior to the aneurysm and to the right of the aneurysm also abutting the posterior duodenum and anterior aspect of the IVC. No dissection. Mural thrombus present in the aneurysm sac. Interval enlargement proximal right common iliac artery aneurysm now  measuring up to 3.5 cm. Reproductive: Status post hysterectomy. No adnexal masses. Other: Umbilical hernia containing fat. Musculoskeletal: No acute or significant osseous findings. IMPRESSION: 1. Interval enlargement of infrarenal abdominal aortic aneurysm now measuring up to 7 cm in greatest diameter. Evidence of acute focal retroperitoneal stranding anterior and to the right of the aneurysm consistent with focal hemorrhage/leak from the aneurysm. 2. Interval enlargement of right common iliac artery aneurysm now measuring up to 3.5 cm. 3. These results were called to the ordering clinician emergently by the CT Department and the patient will be transported to the Emergency Department. Electronically Signed   By: Marcey Moan M.D.   On: 07/25/2024 12:06    ECHO as above  TELEMETRY (personally reviewed): sinus rhythm rate 80, PACs  EKG (personally reviewed): sinus tachycardia rate 114 bpm  Data reviewed by me 08/01/2024: last 24h vitals tele labs imaging I/O ED provider note, admission H&P, PCCM notes, vascular surgery notes  Principal Problem:   AAA (abdominal aortic aneurysm, ruptured) (HCC) Active Problems:   Hemorrhagic shock (HCC)   Acute respiratory failure with hypoxia (HCC)   Atrial fibrillation with rapid ventricular response (HCC)   Acute saddle pulmonary embolism with acute cor pulmonale (HCC)    ASSESSMENT AND PLAN:  Rose Perry is a 78 y.o. female  with a past medical history of peripheral vascular disease, hypertension, type II diabetes who presented to the ED on 07/25/2024 for abdominal pain, found to have ruptured aortic aneurysm. Underwent repair on 07/25/2024. Over the weekend into this AM she developed worsening respiratory status. Troponin noted to be elevated. Cardiology was consulted for further evaluation.   # Elevated troponin, possibly demand # Acute hypoxic respiratory failure # Saddle pulmonary embolus # AAA with rupture s/p repair 07/25/24 # Paroxysmal  atrial fibrillation # Hypertension Patient presented with abdominal pain, found to have ruptured AAA and underwent repair on 07/25/24. Yesterday developed worsening SOB and required BiPAP. Also with tachycardia - AF RVR and started on IV amiodarone. Troponins today found to be elevated at 2159 from 770 yesterday. CXR with patchy atelectasis. Net positive 7.4L since admit. Echo this admission with EF 65-70%, no WMAs, moderate asymmetric LVH, grade I diastolic dysfunction, moderate RV enlargement  with mildly reduced function, trivial MR. CTA PE yesterday with saddle PE - underwent thrombectomy yesterday evening.  - IV heparin  per vascular. - Defer further lasix  dosing at this time. - Suspect troponin elevation secondary to demand given her saddle PE, decompensated respiratory status, recent surgery, blood loss.  Echo overall reassuring. - Transition to PO amiodarone  load with 400 mg BID for 7 days followed by 200 mg daily.  - Further management per PCCM, vascular surgery.   This patient's plan of care was discussed and created with Dr. Florencio and he is in agreement.  Signed: Danita Bloch, PA-C  08/01/2024, 8:34 AM [facility]

## 2024-08-01 NOTE — Progress Notes (Signed)
 PROGRESS NOTE    Rose Perry  FMW:[id number] DOB: [birth year] DOA: 07/25/2024 PCP: Marikay Eva POUR, PA  Chief Complaint  Patient presents with   Abnormal Scan    Hospital Course:  78 year old female with history of PAD, T2DM, and HTN who presented with abdominal pain found to have a ruptured abdominal aneurysm, admitted with hemorrhagic shock and taken emergency for AAA repair with vascular surgery.  Patient remained intubated postop due to severe metabolic acidosis.  Hospital course complicated by tachycardia found to have A-fib with RVR, on IV amiodarone  11/13: CT Abd/pelvis identified 7mm AAA concern for rupture, pt decompensated and was emergently taken to the vascular lab. Placed on levo, later requiring vaso, neo and epi during the procedure. MTP initiated, repeat Hgb was 6.9 (previous 13.5). Admitted to the ICU for mechanical ventilation management and hypotension requiring vasopressors. Started on fentanyl  gtt.  11/14: No significant events overnight.  AFebrile, on low dose levophed.  Hgb remained stable overnight, on minimal vent support, WUA & SBT as tolerated.  Diurese with 40 mg IV Lasix  x1 dose, hopeful for extubation to BiPAP. 11/15: Weaned off Levophed overnight.  Developed tachycardic in afib, rate 140-150s, started on amiodarone gtt. 07/28/24: PCCM reconsulted for acute respiratory distress with hypoxemia; patient placed on BiPAP 07/29/24: Placed on HFNC from bipap, subsequent O2 requirements increased up to 10L, later placed back on bipap. Troponin 2159, ECHO shows EF 65-70% with moderate RV enlargement; cardio consulted. 07/30/24: S/P mechanical thrombectomy for saddle pulmonary embolism. Remains on bipap; attempted to wean FiO2 to 40% overnight but pt desatted to the 80's; placed back on 60%. Will try to wean requirements and transition to [facility] today.  07/31/24: No significant events noted overnight.  FiO2 requirements decreased to 35% on HHFNC, will trial HFNC.   Creatinine stable with diuresis yesterday, UOP 1.3 L last 24 hrs (net +7.3L).  Will give 40 mg IV Lasix  x1 dose. 11/20: Transferred to [facility] and PCU. On 8-9 l HFNC  Subjective:  Sleepy, still on 8-9 Liter HFNC, SOB +   Objective: Vitals:   08/01/24 1542 08/01/24 1946 08/01/24 2015 08/01/24 2034  BP: (!) 149/73   131/66  Pulse: 84   87  Resp: (!) 21 20  20   Temp: 97.7 F (36.5 C)   98 F (36.7 C)  TempSrc:    Oral  SpO2: 95%  94% 98%  Weight:      Height:        Intake/Output Summary (Last 24 hours) at 08/01/2024 2107 Last data filed at 08/01/2024 1947 Gross per 24 hour  Intake 831.88 ml  Output 860 ml  Net -28.12 ml   Filed Weights   07/30/24 0448 07/31/24 0448 08/01/24 0500  Weight: 107.6 kg 108.1 kg 109.9 kg    Examination: General: ill appearing female, laying in bed, sleepy on HFNC HENT: supple, no JVD Lungs: Fine crackles b/l bases, on HFNC Cardiovascular: Tachycardia, regular rhythm, S1 S2, no m/r/g Abdomen: soft, mild distention, slightly firm upper abdomen Extremities: warm to touch throughout, dry, intact, trace edema Neuro: sleepy, non-focal  Assessment & Plan:  Hemorrhagic Shock s/p AAA Rupture s/p Stent Placement (11/13) - resolved - Status post MTP:  Received 7 units pRBCs, 2 units FFP and 1 unit of platelets in OR - Weaned off vasopressors - s/p endovascular stent graft repair - Vascular surgery following  Acute blood loss anemia due to AAA rupture Thrombocytopenia - Status post MTP:  Received 7 units pRBCs, 2 units FFP  and 1 unit of platelets in OR - DIC ruled out ~ smear negative for schistocytes  - stable counts now.  #Acute Metabolic Encephalopathy ~  -slowly improving.   # Paroxysmal Atrial Fibrillation with RVR ~ RESOLVED  #Elevated Troponin in setting of Demand ischemia - echo showing EF 65-70% Cardio following - iv heparin , amiodarone switched to PO  #Acute Hypoxic Respiratory Failure #Mechanical Intubation for Airway Protection  Intraoperatively & Metabolic Acidosis ~ EXTUBATED 88/85 #Symptomatic Pulmonary Emboli with RH Strain S/P Thrombectomy (07/29/24)  #Pulmonary Edema  #Questionable HCAP Chest CT - Saddle PE  Off BiPAP -> HFNC 8-9 liters now Heparin  drip for now. -> transition to DOAC before DC   #Suspected HCAP -Continue empiric Zosyn  (plan for 7 day course) pending cultures & sensitivities   #Acute Kidney Injury - improving #Metabolic Acidosis s/t Hemorrhagic Shock iso AAA Rupture ~ RESOLVED #Hypocalcemia Hypokalemia Hypomagnesemia - Pharmacy c/s for assistance with electrolyte replacement   #Type II Diabetes Mellitus - SSI as indicated: Novolog   Obesity Class II - BMI 37 - Outpatient follow up for lifestyle modification and risk factor management  GOC Palliative care c/s   DVT prophylaxis: Heparin  drip   Code Status: Full Code Disposition:  SNF  Consultants:  Treatment Team:  Consulting Physician: Marea Selinda RAMAN, MD  Procedures:  s/p endovascular stent graft repair  Antimicrobials:  Anti-infectives (From admission, onward)    Start     Dose/Rate Route Frequency Ordered Stop   07/30/24 0600  ceFAZolin  (ANCEF ) IVPB 2g/100 mL premix  Status:  Discontinued        2 g 200 mL/hr over 30 Minutes Intravenous On call to O.R. 07/29/24 1827 07/29/24 2005   07/29/24 2200  vancomycin (VANCOCIN) IVPB 1000 mg/200 mL premix  Status:  Discontinued        1,000 mg 200 mL/hr over 60 Minutes Intravenous Every 24 hours 07/28/24 1941 07/29/24 1537   07/29/24 1701  ceFAZolin  (ANCEF ) IVPB 2g/100 mL premix        2 g 200 mL/hr over 30 Minutes Intravenous 30 min pre-op 07/29/24 1701 07/29/24 2011   07/28/24 2200  piperacillin -tazobactam (ZOSYN ) IVPB 3.375 g        3.375 g 12.5 mL/hr over 240 Minutes Intravenous Every 8 hours 07/28/24 1902 08/05/24 0159   07/28/24 2000  vancomycin (VANCOREADY) IVPB 2000 mg/400 mL        2,000 mg 200 mL/hr over 120 Minutes Intravenous  Once 07/28/24 1902 07/28/24 2157    07/26/24 0600  ceFAZolin  (ANCEF ) IVPB 2g/100 mL premix        2 g 200 mL/hr over 30 Minutes Intravenous On call to O.R. 07/25/24 1257 07/26/24 0804   07/25/24 2100  ceFAZolin  (ANCEF ) IVPB 2g/100 mL premix        2 g 200 mL/hr over 30 Minutes Intravenous Every 8 hours 07/25/24 1530 07/26/24 0700       Data Reviewed: I have personally reviewed following labs and imaging studies CBC: Recent Labs  Lab 07/29/24 2019 07/30/24 0429 07/30/24 1134 07/31/24 0421 08/01/24 0500  WBC 7.4 8.3 9.6 11.8* 12.0*  HGB 7.1* 6.9* 8.4* 7.9* 8.3*  HCT 22.6* 22.2* 26.1* 24.5* 26.3*  MCV 87.9 89.2 88.2 88.8 88.9  PLT 135* 138* 155 150 179   Basic Metabolic Panel: Recent Labs  Lab 07/28/24 1459 07/29/24 0531 07/29/24 2019 07/30/24 0429 07/31/24 0421 07/31/24 2245 08/01/24 0500  NA 142 142 140 140 140  --  142  K 3.9 3.7 3.3* 3.7 3.3* 3.9  3.7  CL 106 106 102 104 104  --  103  CO2 25 27 29 28 28   --  30  GLUCOSE 150* 140* 154* 132* 120*  --  116*  BUN 22 25* 25* 26* 30*  --  28*  CREATININE 1.18* 1.13* 1.30* 1.29* 1.31*  --  1.28*  CALCIUM 8.6* 8.6* 8.4* 8.5* 8.5*  --  8.8*  MG 1.9 2.2 2.1 2.1 2.1  --  2.0  PHOS 2.5  --  2.7 2.8 2.5  --  2.1*   GFR: Estimated Creatinine Clearance: 45.5 mL/min (A) (by C-G formula based on SCr of 1.28 mg/dL (H)). Liver Function Tests: Recent Labs  Lab 07/27/24 1706 07/29/24 0531 07/29/24 2019  AST  --  102* 99*  ALT  --  17 27  ALKPHOS  --  54 60  BILITOT  --  0.7 0.6  PROT  --  5.7* 5.4*  ALBUMIN 3.5 3.4* 3.1*   CBG: Recent Labs  Lab 07/31/24 1705 07/31/24 2102 08/01/24 0813 08/01/24 1201 08/01/24 1639  GLUCAP 133* 122* 114* 160* 109*    Recent Results (from the past 240 hours)  MRSA Next Gen by PCR, Nasal     Status: None   Collection Time: 07/25/24  3:33 PM   Specimen: Nasal Mucosa; Nasal Swab  Result Value Ref Range Status   MRSA by PCR Next Gen NOT DETECTED NOT DETECTED Final    Comment: (NOTE) The GeneXpert MRSA Assay (FDA  approved for NASAL specimens only), is one component of a comprehensive MRSA colonization surveillance program. It is not intended to diagnose MRSA infection nor to guide or monitor treatment for MRSA infections. Test performance is not FDA approved in patients less than 10 years old. Performed at [facility], [address] [place], [place] [address]   MRSA Next Gen by PCR, Nasal     Status: None   Collection Time: 07/25/24  3:57 PM   Specimen: Nasal Mucosa; Nasal Swab  Result Value Ref Range Status   MRSA by PCR Next Gen NOT DETECTED NOT DETECTED Final    Comment: (NOTE) The GeneXpert MRSA Assay (FDA approved for NASAL specimens only), is one component of a comprehensive MRSA colonization surveillance program. It is not intended to diagnose MRSA infection nor to guide or monitor treatment for MRSA infections. Test performance is not FDA approved in patients less than 5 years old. Performed at [facility], [address] [place], [place] [address]   Culture, blood (Routine X 2) w Reflex to ID Panel     Status: None (Preliminary result)   Collection Time: 07/28/24  7:39 PM   Specimen: BLOOD  Result Value Ref Range Status   Specimen Description BLOOD BLOOD LEFT HAND  Final   Special Requests   Final    BOTTLES DRAWN AEROBIC AND ANAEROBIC Blood Culture adequate volume   Culture   Final    NO GROWTH 4 DAYS Performed at [facility], [address] [place], [place] [address]    Report Status PENDING  Incomplete  Culture, blood (Routine X 2) w Reflex to ID Panel     Status: None (Preliminary result)   Collection Time: 07/28/24  7:46 PM   Specimen: BLOOD  Result Value Ref Range Status   Specimen Description BLOOD BLOOD RIGHT HAND  Final   Special Requests   Final    BOTTLES DRAWN AEROBIC AND ANAEROBIC Blood Culture results may not be optimal due to an inadequate volume of  blood received in culture bottles   Culture   Final    NO GROWTH 4  DAYS Performed at [facility], [address] [place], [place] [address]    Report Status PENDING  Incomplete     Radiology Studies: DG Chest Port 1 View Result Date: 07/31/2024 EXAM: 1 VIEW(S) XRAY OF THE CHEST 07/31/2024 04:11:00 AM COMPARISON: 07/29/2024 CLINICAL HISTORY: Shortness of breath FINDINGS: LINES, TUBES AND DEVICES: Right IJ CVC in place with tip overlying right atrium. LUNGS AND PLEURA: Diffuse interstitial prominence with patchy bibasilar opacities. Small right pleural effusion. No pneumothorax. HEART AND MEDIASTINUM: Stable cardiomegaly. Aortic atherosclerosis. BONES AND SOFT TISSUES: No acute osseous abnormality. IMPRESSION: 1. Diffuse interstitial prominence with patchy bibasilar opacities, which may reflect edema or infection, and a small right pleural effusion. Electronically signed by: Waddell Calk MD 07/31/2024 05:58 AM EST RP Workstation: [id number]    Scheduled Meds:  amiodarone  400 mg Oral BID   Followed by   NOREEN ON 08/08/2024] amiodarone  200 mg Oral Daily   Chlorhexidine Gluconate Cloth  6 each Topical Daily   docusate sodium  100 mg Oral Daily   dorzolamide-timolol  1 drop Both Eyes BID   feeding supplement  237 mL Oral TID BM   insulin  aspart  0-15 Units Subcutaneous TID WC   insulin  aspart  0-5 Units Subcutaneous QHS   ipratropium  0.5 mg Nebulization TID   levalbuterol  1.25 mg Nebulization TID   multivitamin with minerals  1 tablet Oral Daily   pantoprazole   40 mg Oral QHS   polyethylene glycol  17 g Oral BID   sennosides  15 mL Oral QHS   sodium chloride  flush  3 mL Intravenous Q12H   thiamine  100 mg Oral Daily   Continuous Infusions:  heparin  1,550 Units/hr (08/01/24 1947)   piperacillin -tazobactam (ZOSYN )  IV 12.5 mL/hr at 08/01/24 1947   Time Spent: 35 mins   LOS: 7 days  MDM: Patient is high risk for one or more organ failure.  They necessitate ongoing hospitalization for continued IV therapies and subsequent lab  monitoring. Total time spent interpreting labs and vitals, reviewing the medical record, coordinating care amongst consultants and care team members, directly assessing and discussing care with the patient and/or family: 55 min Keylen Uzelac Maree, MD [facility]  To contact the attending physician between 7A-7P please use Epic Chat. To contact the covering physician during after hours 7P-7A, please review Amion.  08/01/2024, 9:07 PM   *This document has been created with the assistance of dictation software. Please excuse typographical errors. *

## 2024-08-01 NOTE — Plan of Care (Signed)
   Problem: Education: Goal: Knowledge of General Education information will improve Description Including pain rating scale, medication(s)/side effects and non-pharmacologic comfort measures Outcome: Progressing

## 2024-08-02 DIAGNOSIS — I713 Abdominal aortic aneurysm, ruptured, unspecified: Secondary | ICD-10-CM | POA: Diagnosis not present

## 2024-08-02 DIAGNOSIS — I2602 Saddle embolus of pulmonary artery with acute cor pulmonale: Secondary | ICD-10-CM | POA: Diagnosis not present

## 2024-08-02 DIAGNOSIS — I4891 Unspecified atrial fibrillation: Secondary | ICD-10-CM | POA: Diagnosis not present

## 2024-08-02 DIAGNOSIS — J9601 Acute respiratory failure with hypoxia: Secondary | ICD-10-CM | POA: Diagnosis not present

## 2024-08-02 LAB — CBC
HCT: 30.2 % — ABNORMAL LOW (ref 36.0–46.0)
Hemoglobin: 9.3 g/dL — ABNORMAL LOW (ref 12.0–15.0)
MCH: 27.6 pg (ref 26.0–34.0)
MCHC: 30.8 g/dL (ref 30.0–36.0)
MCV: 89.6 fL (ref 80.0–100.0)
Platelets: 199 K/uL (ref 150–400)
RBC: 3.37 MIL/uL — ABNORMAL LOW (ref 3.87–5.11)
RDW: 16.9 % — ABNORMAL HIGH (ref 11.5–15.5)
WBC: 11.7 K/uL — ABNORMAL HIGH (ref 4.0–10.5)
nRBC: 1.4 % — ABNORMAL HIGH (ref 0.0–0.2)

## 2024-08-02 LAB — CULTURE, BLOOD (ROUTINE X 2)
Culture: NO GROWTH
Culture: NO GROWTH
Special Requests: ADEQUATE

## 2024-08-02 LAB — BASIC METABOLIC PANEL WITH GFR
Anion gap: 8 (ref 5–15)
BUN: 28 mg/dL — ABNORMAL HIGH (ref 8–23)
CO2: 29 mmol/L (ref 22–32)
Calcium: 9.1 mg/dL (ref 8.9–10.3)
Chloride: 106 mmol/L (ref 98–111)
Creatinine, Ser: 1.17 mg/dL — ABNORMAL HIGH (ref 0.44–1.00)
GFR, Estimated: 47 mL/min — ABNORMAL LOW (ref >60)
Glucose, Bld: 115 mg/dL — ABNORMAL HIGH (ref 70–99)
Potassium: 3.7 mmol/L (ref 3.5–5.1)
Sodium: 143 mmol/L (ref 135–145)

## 2024-08-02 LAB — GLUCOSE, CAPILLARY
Glucose-Capillary: 112 mg/dL — ABNORMAL HIGH (ref 70–99)
Glucose-Capillary: 155 mg/dL — ABNORMAL HIGH (ref 70–99)
Glucose-Capillary: 143 mg/dL — ABNORMAL HIGH (ref 70–99)
Glucose-Capillary: 139 mg/dL — ABNORMAL HIGH (ref 70–99)

## 2024-08-02 LAB — HEPARIN LEVEL (UNFRACTIONATED): Heparin Unfractionated: 0.31 [IU]/mL (ref 0.30–0.70)

## 2024-08-02 LAB — PHOSPHORUS: Phosphorus: 3.3 mg/dL (ref 2.5–4.6)

## 2024-08-02 MED ORDER — APIXABAN 5 MG PO TABS: 5.0000 mg | ORAL_TABLET | Freq: Two times a day (BID) | ORAL | Status: DC

## 2024-08-02 MED ORDER — MORPHINE SULFATE (PF) 2 MG/ML IV SOLN: 1.0000 mg | INTRAVENOUS | Status: DC | PRN

## 2024-08-02 MED ORDER — APIXABAN 5 MG PO TABS
10.0000 mg | ORAL_TABLET | Freq: Two times a day (BID) | ORAL | Status: DC
  Administered 2024-08-02 – 2024-08-06 (×9): 10 mg via ORAL
  Filled 2024-08-02 (×9): qty 2

## 2024-08-02 NOTE — Progress Notes (Signed)
 PROGRESS NOTE    Rose Perry  FMW:[id number] DOB: [birth year] DOA: 07/25/2024 PCP: Marikay Eva POUR, PA  Chief Complaint  Patient presents with   Abnormal Scan    Hospital Course:  78 year old female with history of PAD, T2DM, and HTN who presented with abdominal pain found to have a ruptured abdominal aneurysm, admitted with hemorrhagic shock and taken emergency for AAA repair with vascular surgery.  Patient remained intubated postop due to severe metabolic acidosis.  Hospital course complicated by tachycardia found to have A-fib with RVR, on IV amiodarone   11/13: CT Abd/pelvis identified 7mm AAA concern for rupture, pt decompensated and was emergently taken to the vascular lab. Placed on levo, later requiring vaso, neo and epi during the procedure. MTP initiated, repeat Hgb was 6.9 (previous 13.5). Admitted to the ICU for mechanical ventilation management and hypotension requiring vasopressors. Started on fentanyl  gtt.  11/14: No significant events overnight.  AFebrile, on low dose levophed .  Hgb remained stable overnight, on minimal vent support, WUA & SBT as tolerated.  Diurese with 40 mg IV Lasix  x1 dose, hopeful for extubation to BiPAP. 11/15: Weaned off Levophed  overnight.  Developed tachycardic in afib, rate 140-150s, started on amiodarone  gtt. 07/28/24: PCCM reconsulted for acute respiratory distress with hypoxemia; patient placed on BiPAP 07/29/24: Placed on HFNC from bipap, subsequent O2 requirements increased up to 10L, later placed back on bipap. Troponin 2159, ECHO shows EF 65-70% with moderate RV enlargement; cardio consulted. 07/30/24: S/P mechanical thrombectomy for saddle pulmonary embolism. Remains on bipap; attempted to wean FiO2 to 40% overnight but pt desatted to the 80's; placed back on 60%. Will try to wean requirements and transition to [facility] today.  07/31/24: No significant events noted overnight.  FiO2 requirements decreased to 35% on HHFNC, will trial HFNC.   Creatinine stable with diuresis yesterday, UOP 1.3 L last 24 hrs (net +7.3L).  Will give 40 mg IV Lasix  x1 dose. 11/20: Transferred to [facility] and PCU. On 8-9 l HFNC 11/21: change Heparin  to Eliquis , wean off - 6 liter HFNC, tapering off Morphine   Subjective:  Slowly improving. Agreeable with SNF   Objective: Vitals:   08/02/24 1157 08/02/24 1704 08/02/24 2010 08/02/24 2024  BP: (!) 156/78 (!) 143/75  (!) 148/70  Pulse: 86 80  75  Resp: 17 17  18   Temp: (!) 97.4 F (36.3 C) 98.3 F (36.8 C)  98.5 F (36.9 C)  TempSrc:    Oral  SpO2: 93% 98% 95% 100%  Weight:      Height:        Intake/Output Summary (Last 24 hours) at 08/02/2024 2040 Last data filed at 08/02/2024 1919 Gross per 24 hour  Intake 702.82 ml  Output --  Net 702.82 ml   Filed Weights   07/31/24 0448 08/01/24 0500 08/02/24 0129  Weight: 108.1 kg 109.9 kg 109.6 kg    Examination: General: ill appearing female, laying in bed,  HENT: supple, no JVD Lungs: Fine crackles b/l bases, on 6 liter HFNC Cardiovascular: Tachycardia, regular rhythm, S1 S2, no m/r/g Abdomen: soft, benign Extremities: warm to touch throughout, dry, intact, trace edema Neuro: alert and awake, non-focal  Assessment & Plan:  Hemorrhagic Shock s/p AAA Rupture s/p Stent Placement (11/13) - resolved - Status post MTP:  Received 7 units pRBCs, 2 units FFP and 1 unit of platelets in OR - Weaned off vasopressors - s/p endovascular stent graft repair - Vascular surgery following  Acute blood loss anemia due to AAA rupture Thrombocytopenia -  Status post MTP:  Received 7 units pRBCs, 2 units FFP and 1 unit of platelets in OR - DIC ruled out ~ smear negative for schistocytes  - stable counts now.  #Acute Metabolic Encephalopathy ~  -slowly improving.   # Paroxysmal Atrial Fibrillation with RVR ~ RESOLVED  #Elevated Troponin in setting of Demand ischemia - echo showing EF 65-70% Cardio following - iv heparin , amiodarone  switched to  PO  #Acute Hypoxic Respiratory Failure #Mechanical Intubation for Airway Protection Intraoperatively & Metabolic Acidosis ~ EXTUBATED 88/85 #Symptomatic Pulmonary Emboli with RH Strain S/P Thrombectomy (07/29/24)  #Pulmonary Edema  #Questionable HCAP Chest CT - Saddle PE  Off BiPAP -> HFNC 6 liters now Heparin  drip -> change to Eliquis    #Suspected HCAP -Continue empiric Zosyn  (plan for 7 day course) - stop tomorrow   #Acute Kidney Injury - improving #Metabolic Acidosis s/t Hemorrhagic Shock iso AAA Rupture ~ RESOLVED #Hypocalcemia Hypokalemia Hypomagnesemia - Pharmacy c/s for assistance with electrolyte replacement   #Type II Diabetes Mellitus - SSI as indicated: Novolog   Obesity Class II - BMI 37 - Outpatient follow up for lifestyle modification and risk factor management  GOC Palliative care c/s   DVT prophylaxis: Eliquis    Code Status: Full Code Disposition:  SNF  Consultants:  Treatment Team:  Consulting Physician: Marea Selinda RAMAN, MD  Procedures:  s/p endovascular stent graft repair  Antimicrobials:  Anti-infectives (From admission, onward)    Start     Dose/Rate Route Frequency Ordered Stop   07/30/24 0600  ceFAZolin  (ANCEF ) IVPB 2g/100 mL premix  Status:  Discontinued        2 g 200 mL/hr over 30 Minutes Intravenous On call to O.R. 07/29/24 1827 07/29/24 2005   07/29/24 2200  vancomycin  (VANCOCIN ) IVPB 1000 mg/200 mL premix  Status:  Discontinued        1,000 mg 200 mL/hr over 60 Minutes Intravenous Every 24 hours 07/28/24 1941 07/29/24 1537   07/29/24 1701  ceFAZolin  (ANCEF ) IVPB 2g/100 mL premix        2 g 200 mL/hr over 30 Minutes Intravenous 30 min pre-op 07/29/24 1701 07/29/24 2011   07/28/24 2200  piperacillin -tazobactam (ZOSYN ) IVPB 3.375 g        3.375 g 12.5 mL/hr over 240 Minutes Intravenous Every 8 hours 07/28/24 1902 08/05/24 0159   07/28/24 2000  vancomycin  (VANCOREADY) IVPB 2000 mg/400 mL        2,000 mg 200 mL/hr over 120 Minutes  Intravenous  Once 07/28/24 1902 07/28/24 2157   07/26/24 0600  ceFAZolin  (ANCEF ) IVPB 2g/100 mL premix        2 g 200 mL/hr over 30 Minutes Intravenous On call to O.R. 07/25/24 1257 07/26/24 0804   07/25/24 2100  ceFAZolin  (ANCEF ) IVPB 2g/100 mL premix        2 g 200 mL/hr over 30 Minutes Intravenous Every 8 hours 07/25/24 1530 07/26/24 0700       Data Reviewed: I have personally reviewed following labs and imaging studies CBC: Recent Labs  Lab 07/30/24 0429 07/30/24 1134 07/31/24 0421 08/01/24 0500 08/02/24 0634  WBC 8.3 9.6 11.8* 12.0* 11.7*  HGB 6.9* 8.4* 7.9* 8.3* 9.3*  HCT 22.2* 26.1* 24.5* 26.3* 30.2*  MCV 89.2 88.2 88.8 88.9 89.6  PLT 138* 155 150 179 199   Basic Metabolic Panel: Recent Labs  Lab 07/29/24 0531 07/29/24 2019 07/30/24 0429 07/31/24 0421 07/31/24 2245 08/01/24 0500 08/02/24 0634  NA 142 140 140 140  --  142 143  K 3.7  3.3* 3.7 3.3* 3.9 3.7 3.7  CL 106 102 104 104  --  103 106  CO2 27 29 28 28   --  30 29  GLUCOSE 140* 154* 132* 120*  --  116* 115*  BUN 25* 25* 26* 30*  --  28* 28*  CREATININE 1.13* 1.30* 1.29* 1.31*  --  1.28* 1.17*  CALCIUM  8.6* 8.4* 8.5* 8.5*  --  8.8* 9.1  MG 2.2 2.1 2.1 2.1  --  2.0  --   PHOS  --  2.7 2.8 2.5  --  2.1* 3.3   GFR: Estimated Creatinine Clearance: 49.7 mL/min (A) (by C-G formula based on SCr of 1.17 mg/dL (H)). Liver Function Tests: Recent Labs  Lab 07/27/24 1706 07/29/24 0531 07/29/24 2019  AST  --  102* 99*  ALT  --  17 27  ALKPHOS  --  54 60  BILITOT  --  0.7 0.6  PROT  --  5.7* 5.4*  ALBUMIN  3.5 3.4* 3.1*   CBG: Recent Labs  Lab 08/01/24 1639 08/01/24 2129 08/02/24 0856 08/02/24 1159 08/02/24 1705  GLUCAP 109* 124* 112* 155* 143*    Recent Results (from the past 240 hours)  MRSA Next Gen by PCR, Nasal     Status: None   Collection Time: 07/25/24  3:33 PM   Specimen: Nasal Mucosa; Nasal Swab  Result Value Ref Range Status   MRSA by PCR Next Gen NOT DETECTED NOT DETECTED Final     Comment: (NOTE) The GeneXpert MRSA Assay (FDA approved for NASAL specimens only), is one component of a comprehensive MRSA colonization surveillance program. It is not intended to diagnose MRSA infection nor to guide or monitor treatment for MRSA infections. Test performance is not FDA approved in patients less than 19 years old. Performed at [facility], [address] [place], [place] [address]   MRSA Next Gen by PCR, Nasal     Status: None   Collection Time: 07/25/24  3:57 PM   Specimen: Nasal Mucosa; Nasal Swab  Result Value Ref Range Status   MRSA by PCR Next Gen NOT DETECTED NOT DETECTED Final    Comment: (NOTE) The GeneXpert MRSA Assay (FDA approved for NASAL specimens only), is one component of a comprehensive MRSA colonization surveillance program. It is not intended to diagnose MRSA infection nor to guide or monitor treatment for MRSA infections. Test performance is not FDA approved in patients less than 21 years old. Performed at [facility], [address] [place], [place] [address]   Culture, blood (Routine X 2) w Reflex to ID Panel     Status: None   Collection Time: 07/28/24  7:39 PM   Specimen: BLOOD  Result Value Ref Range Status   Specimen Description BLOOD BLOOD LEFT HAND  Final   Special Requests   Final    BOTTLES DRAWN AEROBIC AND ANAEROBIC Blood Culture adequate volume   Culture   Final    NO GROWTH 5 DAYS Performed at [facility], [address] [place], [place] [address]    Report Status 08/02/2024 FINAL  Final  Culture, blood (Routine X 2) w Reflex to ID Panel     Status: None   Collection Time: 07/28/24  7:46 PM   Specimen: BLOOD  Result Value Ref Range Status   Specimen Description BLOOD BLOOD RIGHT HAND  Final   Special Requests   Final    BOTTLES DRAWN AEROBIC AND ANAEROBIC Blood Culture results may not be optimal due to  an inadequate volume of blood received in culture bottles   Culture   Final    NO GROWTH  5 DAYS Performed at [facility], [address] [place], [place] [address]    Report Status 08/02/2024 FINAL  Final     Radiology Studies: No results found.   Scheduled Meds:  amiodarone   400 mg Oral BID   Followed by   NOREEN ON 08/08/2024] amiodarone   200 mg Oral Daily   apixaban   10 mg Oral BID   Followed by   NOREEN ON 08/09/2024] apixaban   5 mg Oral BID   Chlorhexidine  Gluconate Cloth  6 each Topical Daily   docusate sodium   100 mg Oral Daily   dorzolamide -timolol   1 drop Both Eyes BID   feeding supplement  237 mL Oral TID BM   insulin  aspart  0-15 Units Subcutaneous TID WC   insulin  aspart  0-5 Units Subcutaneous QHS   ipratropium  0.5 mg Nebulization TID   levalbuterol   1.25 mg Nebulization TID   multivitamin with minerals  1 tablet Oral Daily   pantoprazole   40 mg Oral QHS   polyethylene glycol  17 g Oral BID   sennosides  15 mL Oral QHS   sodium chloride  flush  3 mL Intravenous Q12H   thiamine   100 mg Oral Daily   Continuous Infusions:  piperacillin -tazobactam (ZOSYN )  IV 3.375 g (08/02/24 1811)   Time Spent: 35 mins   LOS: 8 days  MDM: Patient is high risk for one or more organ failure.  They necessitate ongoing hospitalization for continued IV therapies and subsequent lab monitoring. Total time spent interpreting labs and vitals, reviewing the medical record, coordinating care amongst consultants and care team members, directly assessing and discussing care with the patient and/or family: 55 min Unique Sillas Maree, MD [facility]  To contact the attending physician between 7A-7P please use Epic Chat. To contact the covering physician during after hours 7P-7A, please review Amion.  08/02/2024, 8:40 PM   *This document has been created with the assistance of dictation software. Please excuse typographical errors. *

## 2024-08-02 NOTE — Progress Notes (Signed)
 Occupational Therapy Treatment Patient Details Name: Rose Perry MRN: [id number] DOB: 1946/04/07 Today's Date: 08/02/2024   History of present illness Pt is a 78 y/o F admitted on 07/25/24 after presenting with c/o worsening abdominal pain. Imaging was concerned for possible ruptured aneurysm. Pt emergently taken to the vascular lab for repair. Pt also being treated for hemorrhagic shock s/t AAA rupture s/p stent placement, a-fib with RVR. PMH: HTN, DM2, HLD, celiac artery stenosis. Thrombectomy on 11/17.   OT comments  Patient seen for OT treatment on this date. Upon arrival to room patient resting in bed on 9L of O2, agreeable to treatment but required coaxing to participate. Patient transitioned to EOB with CGA. While EOB OT instructed on UB mobility and thoracic mobility in preparation for ADLs and functional mobility. While sitting EOB patient performed 10 reps of neck flexion/extension, head rotation, shoulder cirlces, AAROM shoulder flexion, thoracic twist and scapular retraction. Patient required 3 rest breaks throughout HEP, O2 remained above 92%. Patient returned to bed with min A.  Patient ended treatment in bed with bed/chair alarm on and all needs within reach. Patient making good progress toward goals, will continue to follow POC. Discharge recommendation remains appropriate.        If plan is discharge home, recommend the following:  A lot of help with walking and/or transfers;A lot of help with bathing/dressing/bathroom;Assistance with cooking/housework;Assist for transportation;Help with stairs or ramp for entrance   Equipment Recommendations  Other (comment)    Recommendations for Other Services      Precautions / Restrictions Precautions Precautions: Fall Recall of Precautions/Restrictions: Intact Precaution/Restrictions Comments: monitor O2, HR Restrictions Weight Bearing Restrictions Per Provider Order: No       Mobility Bed Mobility Overal bed mobility:  Needs Assistance Bed Mobility: Supine to Sit, Sit to Supine     Supine to sit: Min assist Sit to supine: Min assist   General bed mobility comments: cues for sequencing and initiation    Transfers                         Balance Overall balance assessment: Needs assistance Sitting-balance support: Feet supported Sitting balance-Leahy Scale: Good                                     ADL either performed or assessed with clinical judgement   ADL                                              Extremity/Trunk Assessment              Vision       Perception     Praxis     Communication Communication Communication: No apparent difficulties   Cognition Arousal: Alert Behavior During Therapy: WFL for tasks assessed/performed Cognition: No apparent impairments                               Following commands: Intact        Cueing   Cueing Techniques: Verbal cues  Exercises Exercises: Other exercises Other Exercises Other Exercises: instructed patient on UB mobility HEP while sitting EOB with focus on pursed lip breathing and diaphragmatic breathing Other Exercises: HEP:  neck flexion/extension, neck rotation, shoulder circles, AAROM shoulder flexion    Shoulder Instructions       General Comments O2 remained above 93% throughout tx    Pertinent Vitals/ Pain       Pain Assessment Pain Assessment: No/denies pain  Home Living                                          Prior Functioning/Environment              Frequency  Min 2X/week        Progress Toward Goals  OT Goals(current goals can now be found in the care plan section)  Progress towards OT goals: Progressing toward goals  Acute Rehab OT Goals Patient Stated Goal: to go home OT Goal Formulation: With patient/family Time For Goal Achievement: 08/14/24 Potential to Achieve Goals: Fair ADL Goals Pt Will Perform  Grooming: with supervision;sitting Pt Will Perform Lower Body Dressing: with supervision;sit to/from stand Pt Will Transfer to Toilet: with supervision;ambulating Pt Will Perform Toileting - Clothing Manipulation and hygiene: with supervision;sit to/from stand  Plan      Co-evaluation                 AM-PAC OT 6 Clicks Daily Activity     Outcome Measure   Help from another person eating meals?: None Help from another person taking care of personal grooming?: None Help from another person toileting, which includes using toliet, bedpan, or urinal?: A Lot Help from another person bathing (including washing, rinsing, drying)?: A Lot Help from another person to put on and taking off regular upper body clothing?: A Little Help from another person to put on and taking off regular lower body clothing?: A Lot 6 Click Score: 17    End of Session    OT Visit Diagnosis: Unsteadiness on feet (R26.81);Repeated falls (R29.6);Muscle weakness (generalized) (M62.81)   Activity Tolerance Patient limited by fatigue   Patient Left in bed;with call bell/phone within reach;with bed alarm set   Nurse Communication          Time: 8497-8477 OT Time Calculation (min): 20 min  Charges: OT General Charges $OT Visit: 1 Visit OT Treatments $Therapeutic Exercise: 8-22 mins  Rogers Clause, OT/L MSOT, 08/02/2024

## 2024-08-02 NOTE — Progress Notes (Signed)
 Physical Therapy Treatment Rose Perry MRN: [id number] DOB: [birth year] Today's Date: 08/02/2024   History of Present Illness Pt is a 78 y/o F admitted on 07/25/24 after presenting with c/o worsening abdominal pain. Imaging was concerned for possible ruptured aneurysm. Pt emergently taken to the vascular lab for repair. Pt also being treated for hemorrhagic shock s/t AAA rupture s/p stent placement, a-fib with RVR. PMH: HTN, DM2, HLD, celiac artery stenosis. Thrombectomy on 11/17.    PT Comments  Rose is agreeable to PT session. She continues to need assistance with bed mobility and to stand. She was able to take a few steps with the walker with activity tolerance limited by fatigue. Sp02 89-90% on 9 L02 with activity and 96% at rest. Rose seems more open to short term rehab today. PT will continue to follow.    If plan is discharge home, recommend the following: A little help with walking and/or transfers;A little help with bathing/dressing/bathroom;Assistance with cooking/housework;Help with stairs or ramp for entrance   Can travel by private vehicle     No  Equipment Recommendations  None recommended by PT    Recommendations for Other Services       Precautions / Restrictions Precautions Precautions: Fall Recall of Precautions/Restrictions: Intact Precaution/Restrictions Comments: monitor O2, HR Restrictions Weight Bearing Restrictions Per Provider Order: No     Mobility  Bed Mobility Overal bed mobility: Needs Assistance Bed Mobility: Supine to Sit, Sit to Supine     Supine to sit: Min assist Sit to supine: Mod assist   General bed mobility comments: cues for sequencing and initiation    Transfers Overall transfer level: Needs assistance Equipment used: Rolling walker (2 wheels) Transfers: Sit to/from Stand Sit to Stand: Mod assist, From elevated surface           General transfer comment: lifting assistance required for  standing. cues for anterior weight shifting    Ambulation/Gait             Pre-gait activities: side step to right x 3 bouts with rolling walker, CGA for safety. activity tolerance limited by fatigue     Stairs             Wheelchair Mobility     Tilt Bed    Modified Rankin (Stroke Patients Only)       Balance Overall balance assessment: Needs assistance Sitting-balance support: Feet supported Sitting balance-Leahy Scale: Good     Standing balance support: Bilateral upper extremity supported, During functional activity, Reliant on assistive device for balance Standing balance-Leahy Scale: Fair Standing balance comment: with RE for support in standing                            Communication Communication Communication: No apparent difficulties  Cognition Arousal: Alert Behavior During Therapy: WFL for tasks assessed/performed   PT - Cognitive impairments: No apparent impairments                         Following commands: Intact      Cueing Cueing Techniques: Verbal cues  Exercises      General Comments General comments (skin integrity, edema, etc.): Sp02 96% at rest in bed, Sp02 89-90% on 9 L02 with activity.      Pertinent Vitals/Pain Pain Assessment Pain Assessment: No/denies pain    Home Living  Prior Function            PT Goals (current goals can now be found in the care plan section) Acute Rehab PT Goals Rose Stated Goal: to go home PT Goal Formulation: With Rose Time For Goal Achievement: 08/15/24 Potential to Achieve Goals: Fair Progress towards PT goals: Progressing toward goals    Frequency    Min 2X/week      PT Plan      Co-evaluation              AM-PAC PT 6 Clicks Mobility   Outcome Measure  Help needed turning from your back to your side while in a flat bed without using bedrails?: A Little Help needed moving from lying on your back to  sitting on the side of a flat bed without using bedrails?: A Lot Help needed moving to and from a bed to a chair (including a wheelchair)?: A Lot Help needed standing up from a chair using your arms (e.g., wheelchair or bedside chair)?: A Lot Help needed to walk in hospital room?: A Lot Help needed climbing 3-5 steps with a railing? : A Lot 6 Click Score: 13    End of Session Equipment Utilized During Treatment: Oxygen Activity Tolerance: Rose tolerated treatment well Rose left: in bed;with call bell/phone within reach;with bed alarm set Nurse Communication: Mobility status PT Visit Diagnosis: Muscle weakness (generalized) (M62.81);Unsteadiness on feet (R26.81)     Time: 8988-8961 PT Time Calculation (min) (ACUTE ONLY): 27 min  Charges:    $Therapeutic Activity: 23-37 mins PT General Charges $$ ACUTE PT VISIT: 1 Visit                     Randine Essex, PT, MPT    Randine LULLA Essex 08/02/2024, 11:32 AM

## 2024-08-02 NOTE — TOC Progression Note (Signed)
 Transition of Care [facility]) - Progression Note    Patient Details  Name: Rose Perry MRN: [id number] Date of Birth: 1946/07/27  [facility]) CM/SW Contact  Alfonso Rummer, LCSW Phone Number: 08/02/2024, 3:23 PM  Clinical Narrative:    LCSW A. Ceci Taliaferro met with patient at bedside in room 255. LCSW A Donny Heffern informed [name] regarding current recommendations for skilled nursing facility. [name] is agreeable with recommendations. [place] [facility] completed and bed offers submitted. LCSW A. Aurianna Earlywine attempted twice to completed pasrr review however uue system issues I was unable to obtain a pasrr number for [name].      Barriers to Discharge: Continued Medical Work up               Expected Discharge Plan and Services                                               Social Drivers of Health (SDOH) Interventions SDOH Screenings   Food Insecurity: Patient Unable To Answer (07/25/2024)  Housing: Patient Unable To Answer (07/25/2024)  Transportation Needs: Patient Unable To Answer (07/25/2024)  Utilities: Patient Unable To Answer (07/25/2024)  Financial Resource Strain: Low Risk  (01/18/2023)   Received from [facility]  Social Connections: Patient Unable To Answer (07/25/2024)  Tobacco Use: Medium Risk (07/25/2024)   Received from [facility]    Readmission Risk Interventions     No data to display

## 2024-08-02 NOTE — [place] FL2 (Signed)
 [place]  MEDICAID FL2 LEVEL OF CARE FORM     IDENTIFICATION  Patient Name: Rose Perry Birthdate: April 22, 1946 Sex: female Admission Date (Current Location): 07/25/2024  [facility] and [place] Number:  Chiropodist and Address:  [facility], [address], [place], [place] [address]      Provider Number: [id number]  Attending Physician Name and Address:  Maree Hue, MD  Relative Name and Phone Number:  Kailani Brass  [phone]    Current Level of Care: SNF Recommended Level of Care: Skilled Nursing Facility Prior Approval Number:    Date Approved/Denied:   PASRR Number: Pending  Discharge Plan: SNF    Current Diagnoses: Patient Active Problem List   Diagnosis Date Noted   Acute saddle pulmonary embolism with acute cor pulmonale (HCC) 07/30/2024   Atrial fibrillation with rapid ventricular response (HCC) 07/27/2024   AAA (abdominal aortic aneurysm, ruptured) (HCC) 07/25/2024   Hemorrhagic shock (HCC) 07/25/2024   Acute respiratory failure with hypoxia (HCC) 07/25/2024   Leg pain, diffuse, right 05/24/2021   Arthritis 11/23/2020   Celiac artery stenosis 11/22/2020   Chronic venous insufficiency 11/22/2020   Lymphedema 11/22/2020   Hyperlipidemia 11/22/2020   Dehydration    Lower GI bleed    Abdominal aortic aneurysm (AAA) without rupture    Acute blood loss anemia 08/09/2020   Hypertension    Hypokalemia    Rectal bleed    Primary open angle glaucoma (POAG) of both eyes, moderate stage 08/02/2019   CME (cystoid macular edema), right 11/02/2018   CNVM (choroidal neovascular membrane), right 11/02/2018   Hypertensive retinopathy of both eyes 11/02/2018   Type 2 diabetes mellitus with left 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 Yes; Fluid consistency: Thin: General starting at 11/19 1137)  AMBULATORY STATUS COMMUNICATION OF NEEDS Skin   Limited Assist   Normal                       Personal Care Assistance Level of Assistance  Dressing     Dressing Assistance: Limited assistance     Functional Limitations Info             SPECIAL CARE FACTORS FREQUENCY  PT (By licensed PT), OT (By licensed OT)     PT Frequency: 5x OT Frequency: 5x            Contractures      Additional Factors Info                  Current Medications (08/02/2024):  This is the current hospital active medication list Current Facility-Administered Medications  Medication Dose Route Frequency Provider Last Rate Last Admin   acetaminophen  (TYLENOL ) tablet 325-650 mg  325-650 mg Oral Q4H PRN Schnier, Gregory G, MD       Or   acetaminophen  (TYLENOL ) suppository 325-650 mg  325-650 mg Rectal Q4H PRN Schnier, Gregory G, MD       amiodarone  (PACERONE ) tablet 400 mg  400 mg Oral BID Hudson, Caralyn, PA-C   400 mg at 08/02/24 9083   Followed by   [  START ON 08/08/2024] amiodarone  (PACERONE ) tablet 200 mg  200 mg Oral Daily Hudson, Caralyn, PA-C       apixaban  (ELIQUIS ) tablet 10 mg  10 mg Oral BID Shah, Vipul, MD   10 mg at 08/02/24 1426   Followed by   NOREEN ON 08/09/2024] apixaban  (ELIQUIS ) tablet 5 mg  5 mg Oral BID Maree Hue, MD       Chlorhexidine  Gluconate Cloth 2 % PADS 6 each  6 each Topical Daily Assaker, Darrin, MD   6 each at 08/01/24 2047   docusate sodium  (COLACE) capsule 100 mg  100 mg Oral Daily Schnier, Gregory G, MD   100 mg at 08/02/24 9083   dorzolamide -timolol  (COSOPT ) 2-0.5 % ophthalmic solution 1 drop  1 drop Both Eyes BID Schnier, Gregory G, MD   1 drop at 08/02/24 9074   feeding  supplement (ENSURE PLUS HIGH PROTEIN) liquid 237 mL  237 mL Oral TID BM Assaker, Darrin, MD   237 mL at 08/02/24 1227   insulin  aspart (novoLOG ) injection 0-15 Units  0-15 Units Subcutaneous TID WC Schnier, Gregory G, MD   3 Units at 08/02/24 1226   insulin  aspart (novoLOG ) injection 0-5 Units  0-5 Units Subcutaneous QHS Schnier, Gregory G, MD   2 Units at 07/25/24 2102   ipratropium (ATROVENT ) nebulizer solution 0.5 mg  0.5 mg Nebulization TID Malka Darrin, MD   0.5 mg at 08/02/24 1323   levalbuterol  (XOPENEX ) nebulizer solution 1.25 mg  1.25 mg Nebulization TID Malka Darrin, MD   1.25 mg at 08/02/24 1323   morphine  (PF) 2 MG/ML injection 2-5 mg  2-5 mg Intravenous Q1H PRN Schnier, Gregory G, MD   2 mg at 07/31/24 2128   multivitamin with minerals tablet 1 tablet  1 tablet Oral Daily Assaker, Darrin, MD   1 tablet at 08/02/24 9083   ondansetron  (ZOFRAN ) injection 4 mg  4 mg Intravenous Q6H PRN Schnier, Gregory G, MD   4 mg at 07/27/24 1959   Oral care mouth rinse  15 mL Mouth Rinse PRN Schnier, Cordella [name], MD       pantoprazole  (PROTONIX ) EC tablet 40 mg  40 mg Oral QHS Grubb, Rodney D, RPH   40 mg at 08/01/24 2045   phenol (CHLORASEPTIC) mouth spray 1 spray  1 spray Mouth/Throat PRN Schnier, Cordella [name], MD       piperacillin -tazobactam (ZOSYN ) IVPB 3.375 g  3.375 g Intravenous Q8H Assaker, Darrin, MD   Stopped at 08/02/24 1332   polyethylene glycol (MIRALAX  / GLYCOLAX ) packet 17 g  17 g Oral BID Schnier, Gregory G, MD   17 g at 08/02/24 0915   potassium chloride  SA (KLOR-CON  M) CR tablet 40-60 mEq  40-60 mEq Oral Daily PRN Schnier, Gregory G, MD       sennosides (SENOKOT) 8.8 MG/5ML syrup 15 mL  15 mL Oral QHS Schnier, Cordella [name], MD   15 mL at 08/01/24 2046   sodium chloride  flush (NS) 0.9 % injection 3 mL  3 mL Intravenous Q12H Schnier, Cordella [name], MD   3 mL at 08/02/24 9076   thiamine  (VITAMIN B1) tablet 100 mg  100 mg Oral Daily Assaker, Darrin, MD   100 mg at  08/02/24 0915     Discharge Medications: Please see discharge summary for a list of discharge medications.  Relevant Imaging Results:  Relevant Lab Results:   Additional Information [id number]  Alfonso Rummer, LCSW

## 2024-08-02 NOTE — Progress Notes (Signed)
 Progress Note    08/02/2024 11:50 AM 4 Days Post-Op  Subjective:  Rose Perry is a 78 yo female who is now POD #7 and #4 from:  PROCEDURE: 07/25/2024 US  guidance for vascular access, bilateral femoral arteries Catheter placement into aorta from bilateral femoral approaches Catheter placement into the right internal iliac artery from the left common femoral approach third order catheter placement Coil embolization of the right internal iliac artery for treatment of the right common iliac artery aneurysm. Placement of a 28 x 14 x 12 C3 conformable Gore Excluder Endoprosthesis main body with a 16 x 10 left iliac extension limb with a 12 x 14 contralateral limb that extends into the right external iliac artery for treatment of the common iliac artery aneurysm Placement of a 28 mm proximal aortic extension cuff Selective catheterization of the left renal artery first-order catheter placement. Placement of a 7 mm x 26 mm Lifestream stent left renal artery ProGlide closure devices bilateral femoral arteries   PRE-OPERATIVE DIAGNOSIS: Ruptured juxtarenal AAA with hemodynamic shock   POST-OPERATIVE DIAGNOSIS: same   SURGEON: Cordella Shawl, MD and Selinda Gu, MD - Co-surgeons   ANESTHESIA: general   POD #4   Date of Surgery: 07/29/2024,7:54 PM   Surgeon:Schnier, Cordella [name]    Pre-operative Diagnosis: Symptomatic pulmonary emboli with right heart strain and hypoxia   Post-operative diagnosis:  Same   Procedure(s) Performed:             1.  Contrast injection right heart and bilateral pulmonary arteries             2.  Placement of a right IJ triple-lumen catheter with ultrasound guidance             3.  Mechanical thrombectomy bilateral lobar pulmonary arteries for removal of pulmonary emboli using the Penumbra CAT 8 thrombectomy catheter.             4.  Selective catheter placement right upper lobe pulmonary artery, middle lobe pulmonary artery and lower lobe pulmonary artery              5.  Selective catheter placement left upper lobe pulmonary artery and lower lobe pulmonary artery.             6.  Placement of a Denali infrarenal IVC filter  Patient states she is feeling better. No longer needing supplemental oxygen but her respiratory status is improved subjectively. No major events overnight.   Vitals:   08/02/24 0811 08/02/24 0855  BP:  (!) 162/75  Pulse:  79  Resp:  17  Temp:  98.1 F (36.7 C)  SpO2: 94% 94%   Physical Exam: Cardiac: RRR with PVC's, normal S1 and S2.  No murmurs appreciated. Lungs: Nonlabored breathing on BIPap.  No rales rhonchi or wheezing noted. Diminished lung sounds in the right lower and middle lobe.  Incisions: Bilateral groin puncture sites.  Dressings clean dry and intact and without any hematoma seroma to note. Extremities: All extremities are warm to touch.  Positive palpable pulses in bilateral lower extremities. Abdomen: Positive bowel sounds throughout, patient's mid abdomen remains tender upon palpation but this is expected.  Nondistended this morning. Neurologic: Alert and oriented X 3. Answers all questions and follows commands.  CBC    Component Value Date/Time   WBC 11.7 (H) 08/02/2024 0634   RBC 3.37 (L) 08/02/2024 0634   HGB 9.3 (L) 08/02/2024 0634   HGB 11.9 (L) 08/22/2013 0455   HCT 30.2 (L) 08/02/2024 9365  HCT 43.8 08/07/2013 0925   PLT 199 08/02/2024 0634   PLT 217 08/22/2013 0455   MCV 89.6 08/02/2024 0634   MCV 78 (L) 08/07/2013 0925   MCH 27.6 08/02/2024 0634   MCHC 30.8 08/02/2024 0634   RDW 16.9 (H) 08/02/2024 0634   RDW 14.4 08/07/2013 0925   LYMPHSABS 2.0 08/13/2020 0527   MONOABS 0.5 08/13/2020 0527   EOSABS 0.4 08/13/2020 0527   BASOSABS 0.0 08/13/2020 0527    BMET    Component Value Date/Time   NA 143 08/02/2024 0634   NA 137 08/23/2013 0519   K 3.7 08/02/2024 0634   K 3.4 (L) 08/23/2013 0519   CL 106 08/02/2024 0634   CL 103 08/23/2013 0519   CO2 29 08/02/2024 0634   CO2 30  08/23/2013 0519   GLUCOSE 115 (H) 08/02/2024 0634   GLUCOSE 168 (H) 08/23/2013 0519   BUN 28 (H) 08/02/2024 0634   BUN 12 08/23/2013 0519   CREATININE 1.17 (H) 08/02/2024 0634   CREATININE 1.17 08/23/2013 0519   CALCIUM  9.1 08/02/2024 0634   CALCIUM  8.2 (L) 08/23/2013 0519   GFRNONAA 47 (L) 08/02/2024 0634   GFRNONAA 48 (L) 08/23/2013 0519   GFRAA 55 (L) 08/23/2013 0519    INR    Component Value Date/Time   INR 1.1 07/29/2024 1741   INR 1.0 08/07/2013 0925     Intake/Output Summary (Last 24 hours) at 08/02/2024 1150 Last data filed at 08/02/2024 0559 Gross per 24 hour  Intake 682.66 ml  Output 350 ml  Net 332.66 ml     Assessment/Plan:  78 y.o. female is s/p SEE ABOVE  4 Days Post-Op   PLAN Patient now off amiodarone  infusion and on oral amiodarone .  Doing well and good rate control. Discontinue heparin  infusion today. Start patient on Eliquis  5 mg twice daily and ASA 81 mg daily.  DVT prophylaxis: Heparin  infusion to be converted to oral anticoagulation today   [name] [facility] 08/02/2024 11:50 AM

## 2024-08-02 NOTE — Progress Notes (Signed)
 [facility]    SUBJECTIVE: Patient resting comfortably denies any chest pain no shortness of breath currently no pain recovering from recent surgery with tachycardia irregular heartbeat.   Vitals:   08/02/24 0811 08/02/24 0855 08/02/24 1157 08/02/24 1704  BP:  (!) 162/75 (!) 156/78 (!) 143/75  Pulse:  79 86 80  Resp:  17 17 17   Temp:  98.1 F (36.7 C) (!) 97.4 F (36.3 C) 98.3 F (36.8 C)  TempSrc:      SpO2: 94% 94% 93% 98%  Weight:      Height:         Intake/Output Summary (Last 24 hours) at 08/02/2024 1931 Last data filed at 08/02/2024 1919 Gross per 24 hour  Intake 1036.36 ml  Output 150 ml  Net 886.36 ml      PHYSICAL EXAM  General: Well developed, well nourished, in no acute distress HEENT:  Normocephalic and atramatic Neck:  No JVD.  Lungs: Clear bilaterally to auscultation and percussion. Heart: Irregular irregular. Normal S1 and S2 without gallops or murmurs.  Abdomen: Bowel sounds are positive, abdomen soft and non-tender  Msk:  Back normal, normal gait. Normal strength and tone for age. Extremities: No clubbing, cyanosis or edema.   Neuro: Alert and oriented X 3. Psych:  Good affect, responds appropriately   LABS: Basic Metabolic Panel: Recent Labs    07/31/24 0421 07/31/24 2245 08/01/24 0500 08/02/24 0634  NA 140  --  142 143  K 3.3*   < > 3.7 3.7  CL 104  --  103 106  CO2 28  --  30 29  GLUCOSE 120*  --  116* 115*  BUN 30*  --  28* 28*  CREATININE 1.31*  --  1.28* 1.17*  CALCIUM  8.5*  --  8.8* 9.1  MG 2.1  --  2.0  --   PHOS 2.5  --  2.1* 3.3   < > = values in this interval not displayed.   Liver Function Tests: No results for input(s): AST, ALT, ALKPHOS, BILITOT, PROT, ALBUMIN  in the last 72 hours. No results for input(s): LIPASE, AMYLASE in the last 72 hours. CBC: Recent Labs    08/01/24 0500 08/02/24 0634  WBC 12.0* 11.7*  HGB 8.3* 9.3*  HCT 26.3* 30.2*  MCV 88.9 89.6  PLT 179 199   Cardiac  Enzymes: No results for input(s): CKTOTAL, CKMB, CKMBINDEX, TROPONINI in the last 72 hours. BNP: Invalid input(s): POCBNP D-Dimer: No results for input(s): DDIMER in the last 72 hours. Hemoglobin A1C: No results for input(s): HGBA1C in the last 72 hours. Fasting Lipid Panel: No results for input(s): CHOL, HDL, LDLCALC, TRIG, CHOLHDL, LDLDIRECT in the last 72 hours. Thyroid Function Tests: No results for input(s): TSH, T4TOTAL, T3FREE, THYROIDAB in the last 72 hours.  Invalid input(s): FREET3 Anemia Panel: No results for input(s): VITAMINB12, FOLATE, FERRITIN, TIBC, IRON , RETICCTPCT in the last 72 hours.  No results found.   Echo preserved left ventricular function EF of 65%  TELEMETRY: Atrial fibrillation paroxysmal rate of 100:  ASSESSMENT AND PLAN:  Principal Problem:   AAA (abdominal aortic aneurysm, ruptured) (HCC) Active Problems:   Hemorrhagic shock (HCC)   Acute respiratory failure with hypoxia (HCC)   Atrial fibrillation with rapid ventricular response (HCC)   Acute saddle pulmonary embolism with acute cor pulmonale (HCC) Acute metabolic encephalopathy Paroxysmal atrial fibrillation Obesity Diabetes  Plan Hemorrhagic shock AAA rupture status post stent placement weaned off of pressors Pulmonary embolus recommend anticoagulation therapy heparin  transition to  Eliquis  Acute metabolic encephalopathy improving slowly continue supportive medical therapy Acute hypoxic respite failure continue pulmonary input respiratory support Atrial fibrillation anticoagulation for stroke prevention Correct electrolytes Continue diabetes management and control Continue conservative medical therapy cardiac standpoint   Cara JONETTA Lovelace, MD 08/02/2024 7:31 PM

## 2024-08-02 NOTE — Progress Notes (Signed)
 PHARMACY - ANTICOAGULATION CONSULT NOTE  Pharmacy Consult for Heparin  Infusion Indication: pulmonary embolus  Allergies  Allergen Reactions   Codeine     Patient Measurements: Height: 5' 7 (170.2 cm) Weight: 109.6 kg (241 lb 10 oz) IBW/kg (Calculated) : 61.6 HEPARIN  DW (KG): 86.1  Vital Signs: Temp: 98 F (36.7 C) (11/21 0020) Temp Source: Oral (11/20 2034) BP: 147/66 (11/21 0020) Pulse Rate: 85 (11/21 0020)  Labs: Recent Labs    07/30/24 0429 07/30/24 1134 07/31/24 0421 08/01/24 0500 08/01/24 1348 08/02/24 0021  HGB 6.9* 8.4* 7.9* 8.3*  --   --   HCT 22.2* 26.1* 24.5* 26.3*  --   --   PLT 138* 155 150 179  --   --   HEPARINUNFRC 0.40 0.50 0.42 0.20* 0.31 0.31  CREATININE 1.29*  --  1.31* 1.28*  --   --     Estimated Creatinine Clearance: 45.5 mL/min (A) (by C-G formula based on SCr of 1.28 mg/dL (H)).   Medical History: Past Medical History:  Diagnosis Date   Celiac artery stenosis    CME (cystoid macular edema)    right   CNVM (choroidal neovascular membrane)    Diabetes mellitus without complication (HCC)    Diverticulosis of colon    Elevated lipids    History of cataract    History of rectal bleeding    Hx of adenomatous colonic polyps    Hyperlipidemia    Hypertension    Hypertensive retinopathy of both eyes    Lymphedema    Primary open angle glaucoma (POAG) of both eyes     Medications:  Not on anticoagulation previously  Assessment: Patient is a 78 year old female with a past medical history of peripheral vascular disease, hypertension, type II diabetes who presented to the ED on 07/25/2024 for abdominal pain, found to have ruptured aortic aneurysm that was repaired on 11/13. Patient taken for CTA today and was found to have a saddle pulmonary embolism extending into segmental branches of all lobes bilaterally. Pharmacy was consulted to initiate patient on a heparin  infusion and monitor/adjust per protocol.  Baseline labs: INR 1.1 aPTT  34s Hgb 7.8 and PLT 140- low but stable No signs/symptoms of bleeding noted in chart  Goal of Therapy:  Heparin  level 0.3-0.7 units/ml Monitor platelets by anticoagulation protocol: Yes   Plan:  11/21:  HL @ 0021 = 0.31, therapeutic X 2 - Will continue pt on current rate and recheck HL on 11/22 with AM labs  -- Monitor CBC daily while on heparin    Noreta Kue D, PharmD 08/02/2024,1:38 AM

## 2024-08-03 DIAGNOSIS — I509 Heart failure, unspecified: Secondary | ICD-10-CM | POA: Diagnosis not present

## 2024-08-03 DIAGNOSIS — Z515 Encounter for palliative care: Secondary | ICD-10-CM

## 2024-08-03 DIAGNOSIS — I2602 Saddle embolus of pulmonary artery with acute cor pulmonale: Secondary | ICD-10-CM | POA: Diagnosis not present

## 2024-08-03 DIAGNOSIS — J9601 Acute respiratory failure with hypoxia: Secondary | ICD-10-CM | POA: Diagnosis not present

## 2024-08-03 DIAGNOSIS — R0602 Shortness of breath: Secondary | ICD-10-CM | POA: Diagnosis not present

## 2024-08-03 DIAGNOSIS — Z7189 Other specified counseling: Secondary | ICD-10-CM

## 2024-08-03 DIAGNOSIS — I4891 Unspecified atrial fibrillation: Secondary | ICD-10-CM | POA: Diagnosis not present

## 2024-08-03 DIAGNOSIS — I713 Abdominal aortic aneurysm, ruptured, unspecified: Secondary | ICD-10-CM | POA: Diagnosis not present

## 2024-08-03 DIAGNOSIS — R578 Other shock: Secondary | ICD-10-CM

## 2024-08-03 LAB — BASIC METABOLIC PANEL WITH GFR
Anion gap: 7 (ref 5–15)
BUN: 29 mg/dL — ABNORMAL HIGH (ref 8–23)
CO2: 31 mmol/L (ref 22–32)
Calcium: 9 mg/dL (ref 8.9–10.3)
Chloride: 107 mmol/L (ref 98–111)
Creatinine, Ser: 1.22 mg/dL — ABNORMAL HIGH (ref 0.44–1.00)
GFR, Estimated: 45 mL/min — ABNORMAL LOW (ref >60)
Glucose, Bld: 116 mg/dL — ABNORMAL HIGH (ref 70–99)
Potassium: 3.7 mmol/L (ref 3.5–5.1)
Sodium: 144 mmol/L (ref 135–145)

## 2024-08-03 LAB — PHOSPHORUS: Phosphorus: 2.8 mg/dL (ref 2.5–4.6)

## 2024-08-03 LAB — CBC
HCT: 31.3 % — ABNORMAL LOW (ref 36.0–46.0)
Hemoglobin: 9.7 g/dL — ABNORMAL LOW (ref 12.0–15.0)
MCH: 27.6 pg (ref 26.0–34.0)
MCHC: 31 g/dL (ref 30.0–36.0)
MCV: 89.2 fL (ref 80.0–100.0)
Platelets: 221 K/uL (ref 150–400)
RBC: 3.51 MIL/uL — ABNORMAL LOW (ref 3.87–5.11)
RDW: 17.1 % — ABNORMAL HIGH (ref 11.5–15.5)
WBC: 12.6 K/uL — ABNORMAL HIGH (ref 4.0–10.5)
nRBC: 1.4 % — ABNORMAL HIGH (ref 0.0–0.2)

## 2024-08-03 LAB — GLUCOSE, CAPILLARY
Glucose-Capillary: 105 mg/dL — ABNORMAL HIGH (ref 70–99)
Glucose-Capillary: 164 mg/dL — ABNORMAL HIGH (ref 70–99)
Glucose-Capillary: 117 mg/dL — ABNORMAL HIGH (ref 70–99)
Glucose-Capillary: 129 mg/dL — ABNORMAL HIGH (ref 70–99)

## 2024-08-03 MED ORDER — LEVALBUTEROL HCL 1.25 MG/0.5ML IN NEBU
1.2500 mg | INHALATION_SOLUTION | Freq: Two times a day (BID) | RESPIRATORY_TRACT | Status: DC
  Administered 2024-08-03 – 2024-08-05 (×4): 1.25 mg via RESPIRATORY_TRACT
  Filled 2024-08-03 (×4): qty 0.5

## 2024-08-03 MED ORDER — IPRATROPIUM BROMIDE 0.02 % IN SOLN
0.5000 mg | Freq: Two times a day (BID) | RESPIRATORY_TRACT | Status: DC
  Administered 2024-08-03 – 2024-08-05 (×4): 0.5 mg via RESPIRATORY_TRACT
  Filled 2024-08-03 (×4): qty 2.5

## 2024-08-03 MED ORDER — FUROSEMIDE 10 MG/ML IJ SOLN
80.0000 mg | Freq: Once | INTRAMUSCULAR | Status: AC
  Administered 2024-08-03: 80 mg via INTRAVENOUS
  Filled 2024-08-03: qty 8

## 2024-08-03 NOTE — Progress Notes (Signed)
 SUBJECTIVE: patient is less SOB.   Vitals:   08/03/24 0500 08/03/24 0759 08/03/24 1149 08/03/24 1345  BP:  (!) 145/72 132/67 (!) 143/61  Pulse:  75 76 76  Resp:    20  Temp:  98.4 F (36.9 C) 98.4 F (36.9 C) 97.9 F (36.6 C)  TempSrc:    Oral  SpO2:  98% 100% 100%  Weight: 109.6 kg     Height:        Intake/Output Summary (Last 24 hours) at 08/03/2024 1534 Last data filed at 08/03/2024 0900 Gross per 24 hour  Intake 420 ml  Output 300 ml  Net 120 ml    LABS: Basic Metabolic Panel: Recent Labs    08/01/24 0500 08/02/24 0634 08/03/24 0539  NA 142 143 144  K 3.7 3.7 3.7  CL 103 106 107  CO2 30 29 31   GLUCOSE 116* 115* 116*  BUN 28* 28* 29*  CREATININE 1.28* 1.17* 1.22*  CALCIUM  8.8* 9.1 9.0  MG 2.0  --   --   PHOS 2.1* 3.3 2.8   Liver Function Tests: No results for input(s): AST, ALT, ALKPHOS, BILITOT, PROT, ALBUMIN  in the last 72 hours. No results for input(s): LIPASE, AMYLASE in the last 72 hours. CBC: Recent Labs    08/02/24 0634 08/03/24 0539  WBC 11.7* 12.6*  HGB 9.3* 9.7*  HCT 30.2* 31.3*  MCV 89.6 89.2  PLT 199 221   Cardiac Enzymes: No results for input(s): CKTOTAL, CKMB, CKMBINDEX, TROPONINI in the last 72 hours. BNP: Invalid input(s): POCBNP D-Dimer: No results for input(s): DDIMER in the last 72 hours. Hemoglobin A1C: No results for input(s): HGBA1C in the last 72 hours. Fasting Lipid Panel: No results for input(s): CHOL, HDL, LDLCALC, TRIG, CHOLHDL, LDLDIRECT in the last 72 hours. Thyroid Function Tests: No results for input(s): TSH, T4TOTAL, T3FREE, THYROIDAB in the last 72 hours.  Invalid input(s): FREET3 Anemia Panel: No results for input(s): VITAMINB12, FOLATE, FERRITIN, TIBC, IRON , RETICCTPCT in the last 72 hours.   PHYSICAL EXAM General: Well developed, well nourished, in no acute distress HEENT:  Normocephalic and atramatic Neck:  No JVD.  Lungs: Clear  bilaterally to auscultation and percussion. Heart: HRRR . Normal S1 and S2 without gallops or murmurs.  Abdomen: Bowel sounds are positive, abdomen soft and non-tender  Msk:  Back normal, normal gait. Normal strength and tone for age. Extremities: No clubbing, cyanosis or edema.   Neuro: Alert and oriented X 3. Psych:  Good affect, responds appropriately  TELEMETRY: sinus rhythm  ASSESSMENT AND PLAN: Rose Perry is a 78 y.o. female  with a past medical history of peripheral vascular disease, hypertension, type II diabetes who presented to the ED on 07/25/2024 for abdominal pain, found to have ruptured aortic aneurysm. Underwent repair on 07/25/2024. Over the weekend into this AM she developed worsening respiratory status. Troponin noted to be elevated. Cardiology was consulted for further evaluation.    # Elevated troponin, possibly demand # Acute hypoxic respiratory failure # Saddle pulmonary embolus # AAA with rupture s/p repair 07/25/24 # Paroxysmal atrial fibrillation # Hypertension Patient presented with abdominal pain, found to have ruptured AAA and underwent repair on 07/25/24. Yesterday developed worsening SOB and required BiPAP. Also with tachycardia - AF RVR and started on IV amiodarone . Troponins today found to be elevated at 2159 from 770 yesterday. CXR with patchy atelectasis. Net positive 7.4L since admit. Echo this admission with EF 65-70%, no WMAs, moderate asymmetric LVH, grade I diastolic dysfunction, moderate RV  enlargement with mildly reduced function, trivial MR. CTA PE yesterday with saddle PE - underwent thrombectomy yesterday evening.  - IV heparin  per vascular. - Defer further lasix  dosing at this time. - Suspect troponin elevation secondary to demand given her saddle PE, decompensated respiratory status, recent surgery, blood loss.  Echo overall reassuring. - Transition to PO amiodarone  load with 400 mg BID for 7 days followed by 200 mg daily.  - Further management  per PCCM, vascular surgery.   ICD-10-CM   1. Ruptured infrarenal abdominal aortic aneurysm (AAA) (HCC)  I71.33       Principal Problem:   AAA (abdominal aortic aneurysm, ruptured) (HCC) Active Problems:   Hemorrhagic shock (HCC)   Acute respiratory failure with hypoxia (HCC)   Atrial fibrillation with rapid ventricular response (HCC)   Acute saddle pulmonary embolism with acute cor pulmonale (HCC)    Rose Bathe, MD, [facility] 08/03/2024 3:34 PM

## 2024-08-03 NOTE — TOC Progression Note (Signed)
 Transition of Care [facility]) - Progression Note    Patient Details  Name: Rose Perry MRN: [id number] Date of Birth: 08/01/1946  [facility] CM/SW Contact  Morene Cecilio E Murrel Freet, LCSW Phone Number: 08/03/2024, 11:21 AM  Clinical Narrative:    Unable to pull patient up in [place] [facility] to complete PASRR. ICM will need to call [place] Must Help Desk during regular business hours.      Barriers to Discharge: Continued Medical Work up               Expected Discharge Plan and Services                                               Social Drivers of Health (SDOH) Interventions SDOH Screenings   Food Insecurity: Patient Unable To Answer (07/25/2024)  Housing: Patient Unable To Answer (07/25/2024)  Transportation Needs: Patient Unable To Answer (07/25/2024)  Utilities: Patient Unable To Answer (07/25/2024)  Financial Resource Strain: Low Risk  (01/18/2023)   Received from [facility]  Social Connections: Patient Unable To Answer (07/25/2024)  Tobacco Use: Medium Risk (07/25/2024)   Received from [facility]    Readmission Risk Interventions     No data to display

## 2024-08-03 NOTE — Progress Notes (Signed)
 5 Days Post-Op   Subjective/Chief Complaint: Denies pain; shortness of breath. Without complaint   Objective: Vital signs in last 24 hours: Temp:  [97.4 F (36.3 C)-98.5 F (36.9 C)] 98.4 F (36.9 C) (11/22 0759) Pulse Rate:  [74-86] 75 (11/22 0759) Resp:  [17-18] 18 (11/22 0323) BP: (132-156)/(70-78) 145/72 (11/22 0759) SpO2:  [92 %-100 %] 98 % (11/22 0759) Weight:  [109.6 kg] 109.6 kg (11/22 0500) Last BM Date : 08/01/24  Intake/Output from previous day: 11/21 0701 - 11/22 0700 In: 593.7 [P.O.:420; I.V.:123.6; IV Piggyback:50.1] Out: 300 [Urine:300] Intake/Output this shift: Total I/O In: 120 [P.O.:120] Out: -   General appearance: alert and no distress Cardio: regular rate and rhythm GI: soft, non-tender; bowel sounds normal; no masses,  no organomegaly Extremities: Bilateral groin access sites- soft, no hematoma, legs warm, +DP  Lab Results:  Recent Labs    08/02/24 0634 08/03/24 0539  WBC 11.7* 12.6*  HGB 9.3* 9.7*  HCT 30.2* 31.3*  PLT 199 221   BMET Recent Labs    08/02/24 0634 08/03/24 0539  NA 143 144  K 3.7 3.7  CL 106 107  CO2 29 31  GLUCOSE 115* 116*  BUN 28* 29*  CREATININE 1.17* 1.22*  CALCIUM  9.1 9.0   PT/INR No results for input(s): LABPROT, INR in the last 72 hours. ABG No results for input(s): PHART, HCO3 in the last 72 hours.  Invalid input(s): PCO2, PO2  Studies/Results: No results found.  Anti-infectives: Anti-infectives (From admission, onward)    Start     Dose/Rate Route Frequency Ordered Stop   07/30/24 0600  ceFAZolin  (ANCEF ) IVPB 2g/100 mL premix  Status:  Discontinued        2 g 200 mL/hr over 30 Minutes Intravenous On call to O.R. 07/29/24 1827 07/29/24 2005   07/29/24 2200  vancomycin  (VANCOCIN ) IVPB 1000 mg/200 mL premix  Status:  Discontinued        1,000 mg 200 mL/hr over 60 Minutes Intravenous Every 24 hours 07/28/24 1941 07/29/24 1537   07/29/24 1701  ceFAZolin  (ANCEF ) IVPB 2g/100 mL premix         2 g 200 mL/hr over 30 Minutes Intravenous 30 min pre-op 07/29/24 1701 07/29/24 2011   07/28/24 2200  piperacillin -tazobactam (ZOSYN ) IVPB 3.375 g        3.375 g 12.5 mL/hr over 240 Minutes Intravenous Every 8 hours 07/28/24 1902 08/05/24 0159   07/28/24 2000  vancomycin  (VANCOREADY) IVPB 2000 mg/400 mL        2,000 mg 200 mL/hr over 120 Minutes Intravenous  Once 07/28/24 1902 07/28/24 2157   07/26/24 0600  ceFAZolin  (ANCEF ) IVPB 2g/100 mL premix        2 g 200 mL/hr over 30 Minutes Intravenous On call to O.R. 07/25/24 1257 07/26/24 0804   07/25/24 2100  ceFAZolin  (ANCEF ) IVPB 2g/100 mL premix        2 g 200 mL/hr over 30 Minutes Intravenous Every 8 hours 07/25/24 1530 07/26/24 0700       Assessment/Plan: POD #8/5 s/p EVAR; Pulmonary thrombectomy  OOB with PT  Supportive care Eliquis /ASA   LOS: 9 days    Tisa Dakin A 08/03/2024

## 2024-08-03 NOTE — Consult Note (Signed)
 Consultation Note Date: 08/03/2024   Patient Name: Rose Perry  DOB: Aug 25, 1946  MRN: [id number]  Age / Sex: 78 y.o., female   PCP: Marikay Eva POUR, PA Referring Physician: Maree Hue, MD  Reason for Consultation: Establishing goals of care     Chief Complaint/History of Present Illness:  Rose Perry is a 78 year old female with past medical history of PAD, type 2 diabetes, hypertension who presented with abdominal pain and was found to have a ruptured aortic aneurysm.  She was admitted with hemorrhagic shock and taken for emergency AAA repair with vascular surgery.  She underwent multiple transfusions with 7 units packed red blood cells, 2 packs of plasma and 1 unit of platelets.  Postop course further complicated by A-fib with RVR.  She then developed worsening respiratory distress and subsequently found to have saddle pulmonary embolus.  She underwent mechanical thrombectomy on 11/18.  She was on high flow nasal cannula which is continued to be titrated down.  I saw her today for discussion regarding goals of care.  EMR reviewed including review of notes from hospitalist, vascular surgery, and transition of care.  Labs today reviewed and note sodium 144, creatinine 1.22, white blood count 12.6, hemoglobin 9.7.  I met today with Rose Perry.  Grandson was at the bedside for last part of encounter as well.  I introduced palliative care as specialized medical care for people living with serious illness. It focuses on providing relief from the symptoms and stress of a serious illness. The goal is to improve quality of life for both the patient and the family.  Rose Perry tells me that the most important things to her are her family and her faith.  She reports that her faith is the most important thing to her.  She relays that church has always been a big part of her life and that her son is a education officer, environmental.  She is a member of [employer] [place], however, she had been attending church with  her son at his new church rather than her traditional church home.  Unfortunately, she reports that some people have been not doing what they should and she has not been attending recently causing her to miss support of her church family.  We discussed in detail her complicated clinical course this hospitalization.  We discussed advanced directives and surrogate decision making.  Concepts specific to code status and rehospitalization discussed.  We discussed difference between a aggressive medical intervention path and a palliative, comfort focused care path.  Values and goals of care important to patient and family were attempted to be elicited.  In reviewing the multiple procedures and aggressive care she has required this hospitalization, she reports that she would like to continue with any and all interventions that are offered at this point.  She reports that she may reconsider this at some point in the future, but for now after having this hospitalization require both emergent surgery and thrombectomy, she is invested in continuation of aggressive care.  Questions and concerns addressed.   PMT will continue to support holistically.  Primary Diagnoses  Present on Admission:  AAA (abdominal aortic aneurysm, ruptured) (HCC)   Palliative Review of Systems: Denies complaints.  Reports she is feeling good today.  Denies pain, shortness of breath, nausea, or anxiety.  Past Medical History:  Diagnosis Date   Celiac artery stenosis    CME (cystoid macular edema)    right   CNVM (choroidal neovascular membrane)    Diabetes mellitus  without complication (HCC)    Diverticulosis of colon    Elevated lipids    History of cataract    History of rectal bleeding    Hx of adenomatous colonic polyps    Hyperlipidemia    Hypertension    Hypertensive retinopathy of both eyes    Lymphedema    Primary open angle glaucoma (POAG) of both eyes    Social History   Socioeconomic History   Marital  status: Divorced    Spouse name: Not on file   Number of children: Not on file   Years of education: Not on file   Highest education level: Not on file  Occupational History   Not on file  Tobacco Use   Smoking status: Former   Smokeless tobacco: Never  Vaping Use   Vaping status: Never Used  Substance and Sexual Activity   Alcohol use: No   Drug use: No   Sexual activity: Not Currently  Other Topics Concern   Not on file  Social History Narrative   There are several medical conditions patient states she has never had. This RN was deleting some when the patient asked me to stop so she could have list. The ones I deleted are AAA, arthritis, and anemia.   Social Drivers of Corporate Investment Banker Strain: Low Risk  (01/18/2023)   Received from [facility]   Overall Financial Resource Strain (CARDIA)    Difficulty of Paying Living Expenses: Not hard at all  Food Insecurity: Patient Unable To Answer (07/25/2024)   Hunger Vital Sign    Worried About Running Out of Food in the Last Year: Patient unable to answer    Ran Out of Food in the Last Year: Patient unable to answer  Transportation Needs: Patient Unable To Answer (07/25/2024)   PRAPARE - Transportation    Lack of Transportation (Medical): Patient unable to answer    Lack of Transportation (Non-Medical): Patient unable to answer  Physical Activity: Not on file  Stress: Not on file  Social Connections: Patient Unable To Answer (07/25/2024)   Social Connection and Isolation Panel    Frequency of Communication with Friends and Family: Patient unable to answer    Frequency of Social Gatherings with Friends and Family: Patient unable to answer    Attends Religious Services: Patient unable to answer    Active Member of Clubs or Organizations: Patient unable to answer    Attends Banker Meetings: Patient unable to answer    Marital Status: Patient unable to answer   Family History  Problem  Relation Age of Onset   Hypertension Mother    Hyperlipidemia Mother    Hypertension Father    Coronary artery disease Brother    Breast cancer Neg Hx    Scheduled Meds:  amiodarone   400 mg Oral BID   Followed by   NOREEN ON 08/08/2024] amiodarone   200 mg Oral Daily   apixaban   10 mg Oral BID   Followed by   NOREEN ON 08/09/2024] apixaban   5 mg Oral BID   Chlorhexidine  Gluconate Cloth  6 each Topical Daily   docusate sodium   100 mg Oral Daily   dorzolamide -timolol   1 drop Both Eyes BID   feeding supplement  237 mL Oral TID BM   insulin  aspart  0-15 Units Subcutaneous TID WC   insulin  aspart  0-5 Units Subcutaneous QHS   ipratropium  0.5 mg Nebulization BID   levalbuterol   1.25 mg Nebulization BID  multivitamin with minerals  1 tablet Oral Daily   pantoprazole   40 mg Oral QHS   polyethylene glycol  17 g Oral BID   sennosides  15 mL Oral QHS   sodium chloride  flush  3 mL Intravenous Q12H   thiamine   100 mg Oral Daily   Continuous Infusions:  piperacillin -tazobactam (ZOSYN )  IV 3.375 g (08/03/24 1037)   PRN Meds:.acetaminophen  **OR** acetaminophen , ondansetron  (ZOFRAN ) IV, mouth rinse, phenol, potassium chloride  Allergies  Allergen Reactions   Codeine    CBC:    Component Value Date/Time   WBC 12.6 (H) 08/03/2024 0539   HGB 9.7 (L) 08/03/2024 0539   HGB 11.9 (L) 08/22/2013 0455   HCT 31.3 (L) 08/03/2024 0539   HCT 43.8 08/07/2013 0925   PLT 221 08/03/2024 0539   PLT 217 08/22/2013 0455   MCV 89.2 08/03/2024 0539   MCV 78 (L) 08/07/2013 0925   NEUTROABS 6.1 08/13/2020 0527   LYMPHSABS 2.0 08/13/2020 0527   MONOABS 0.5 08/13/2020 0527   EOSABS 0.4 08/13/2020 0527   BASOSABS 0.0 08/13/2020 0527   Comprehensive Metabolic Panel:    Component Value Date/Time   NA 144 08/03/2024 0539   NA 137 08/23/2013 0519   K 3.7 08/03/2024 0539   K 3.4 (L) 08/23/2013 0519   CL 107 08/03/2024 0539   CL 103 08/23/2013 0519   CO2 31 08/03/2024 0539   CO2 30 08/23/2013 0519    BUN 29 (H) 08/03/2024 0539   BUN 12 08/23/2013 0519   CREATININE 1.22 (H) 08/03/2024 0539   CREATININE 1.17 08/23/2013 0519   GLUCOSE 116 (H) 08/03/2024 0539   GLUCOSE 168 (H) 08/23/2013 0519   CALCIUM  9.0 08/03/2024 0539   CALCIUM  8.2 (L) 08/23/2013 0519   AST 99 (H) 07/29/2024 2019   ALT 27 07/29/2024 2019   ALKPHOS 60 07/29/2024 2019   BILITOT 0.6 07/29/2024 2019   PROT 5.4 (L) 07/29/2024 2019   ALBUMIN  3.1 (L) 07/29/2024 2019    Physical Exam: Vital Signs: BP (!) 143/61 (BP Location: Left Arm)   Pulse 76   Temp 97.9 F (36.6 C) (Oral)   Resp 20   Ht 5' 7 (1.702 m)   Wt 109.6 kg   SpO2 100%   BMI 37.84 kg/m  SpO2: SpO2: 100 % O2 Device: O2 Device: Nasal Cannula O2 Flow Rate: O2 Flow Rate (L/min): 6 L/min Intake/output summary:  Intake/Output Summary (Last 24 hours) at 08/03/2024 1421 Last data filed at 08/03/2024 0900 Gross per 24 hour  Intake 713.7 ml  Output 300 ml  Net 413.7 ml   LBM: Last BM Date : 08/03/24 Baseline Weight: Weight: 107.2 kg Most recent weight: Weight: 109.6 kg  General: NAD, alert, chronically ill-appearing female Cardiovascular: RRR, no edema in LE b/l Respiratory: no increased work of breathing noted, not in respiratory distress Abdomen: not distended Skin: no rashes or lesions on visible skin Neuro: A&O, following commands easily Psych: appropriately answers all questions                   Additional Data Reviewed: Recent Labs    08/02/24 0634 08/03/24 0539  WBC 11.7* 12.6*  HGB 9.3* 9.7*  PLT 199 221  NA 143 144  BUN 28* 29*  CREATININE 1.17* 1.22*    Imaging: DG Chest Port 1 View EXAM: 1 VIEW(S) XRAY OF THE CHEST 07/31/2024 04:11:00 AM  COMPARISON: 07/29/2024  CLINICAL HISTORY: Shortness of breath  FINDINGS:  LINES, TUBES AND DEVICES: Right IJ CVC in place  with tip overlying right atrium.  LUNGS AND PLEURA: Diffuse interstitial prominence with patchy bibasilar opacities. Small right pleural effusion. No  pneumothorax.  HEART AND MEDIASTINUM: Stable cardiomegaly. Aortic atherosclerosis.  BONES AND SOFT TISSUES: No acute osseous abnormality.  IMPRESSION: 1. Diffuse interstitial prominence with patchy bibasilar opacities, which may reflect edema or infection, and a small right pleural effusion.  Electronically signed by: Waddell Calk MD 07/31/2024 05:58 AM EST RP Workstation: [id number]    I personally reviewed recent imaging.   Palliative Care Assessment and Plan Summary of Established Goals of Care and Medical Treatment Preferences    # Complex medical decision making/goals of care  - Patient reports that her son, granddaughter, and sister would be the people she would want to act as her surrogate if she were unable to speak for herself.  She thinks that her son has paperwork that state this, but when asked if it is HCPOA paperwork, she is unsure.  After going through stepwise line of legal surrogate decision (if she does not in fact have HCPOA paperwork), her son would be her legal surrogate.  She declined to consider completing HCPOA documentation.  - Her goal is to transition to rehab, regain as much functional status as possible and then return to her home.  She is agreeable to short-term rehab at discharge.  - We discussed limitations of care and she is clear and her desire to continue with full code/full scope treatment.  She feels that after recent events including emergent surgery and thrombectomy, she wants to continue with any and all offered interventions.  -  Code Status: Full Code  Prognosis: Unable to determine  # Psycho-social/Spiritual Support:  - Support System: She has a large family.  She reports that her son, sister, and granddaughter are the primary people she would want to act on her behalf. - Desire for further Chaplain support:no  # Discharge Planning:  Skilled Nursing Facility for rehab with Palliative care service follow-up  Thank you for allowing the  palliative care team to participate in the care Naiya C Vendetti.  Amaryllis Meissner, MD Palliative Care Provider PMT # [phone]  If patient remains symptomatic despite maximum doses, please call PMT at [phone] between 0700 and 1900. Outside of these hours, please call attending, as PMT does not have night coverage.   I personally spent a total of 80 minutes in the care of the patient today including preparing to see the patient, getting/reviewing separately obtained history, performing a medically appropriate exam/evaluation, counseling and educating, referring and communicating with other health care professionals, and documenting clinical information in the EHR.

## 2024-08-03 NOTE — Progress Notes (Signed)
Patient transferred off unit.

## 2024-08-03 NOTE — Progress Notes (Signed)
 PROGRESS NOTE    Rose Perry  FMW:[id number] DOB: 1946-01-25 DOA: 07/25/2024 PCP: Marikay Eva POUR, PA  Chief Complaint  Patient presents with   Abnormal Scan    Hospital Course:  78 year old female with history of PAD, T2DM, and HTN who presented with abdominal pain found to have a ruptured abdominal aneurysm, admitted with hemorrhagic shock and taken emergency for AAA repair with vascular surgery.  Patient remained intubated postop due to severe metabolic acidosis.  Hospital course complicated by tachycardia found to have A-fib with RVR, on IV amiodarone   11/13: CT Abd/pelvis identified 7mm AAA concern for rupture, pt decompensated and was emergently taken to the vascular lab. Placed on levo, later requiring vaso, neo and epi during the procedure. MTP initiated, repeat Hgb was 6.9 (previous 13.5). Admitted to the ICU for mechanical ventilation management and hypotension requiring vasopressors. Started on fentanyl  gtt.  11/14: No significant events overnight.  AFebrile, on low dose levophed .  Hgb remained stable overnight, on minimal vent support, WUA & SBT as tolerated.  Diurese with 40 mg IV Lasix  x1 dose, hopeful for extubation to BiPAP. 11/15: Weaned off Levophed  overnight.  Developed tachycardic in afib, rate 140-150s, started on amiodarone  gtt. 07/28/24: PCCM reconsulted for acute respiratory distress with hypoxemia; patient placed on BiPAP 07/29/24: Placed on HFNC from bipap, subsequent O2 requirements increased up to 10L, later placed back on bipap. Troponin 2159, ECHO shows EF 65-70% with moderate RV enlargement; cardio consulted. 07/30/24: S/P mechanical thrombectomy for saddle pulmonary embolism. Remains on bipap; attempted to wean FiO2 to 40% overnight but pt desatted to the 80's; placed back on 60%. Will try to wean requirements and transition to [facility] today.  07/31/24: No significant events noted overnight.  FiO2 requirements decreased to 35% on HHFNC, will trial HFNC.   Creatinine stable with diuresis yesterday, UOP 1.3 L last 24 hrs (net +7.3L).  Will give 40 mg IV Lasix  x1 dose. 11/20: Transferred to [facility] and PCU. On 8-9 l HFNC 11/21: change Heparin  to Eliquis , wean off - 6 liter HFNC, tapering off Morphine  11/22: DC telemetry, DC morphine .  Transfer to any MedSurg  Subjective:  Feeling much better.  Agreeable for SNF placement   Objective: Vitals:   08/03/24 0500 08/03/24 0759 08/03/24 1149 08/03/24 1345  BP:  (!) 145/72 132/67 (!) 143/61  Pulse:  75 76 76  Resp:    20  Temp:  98.4 F (36.9 C) 98.4 F (36.9 C) 97.9 F (36.6 C)  TempSrc:    Oral  SpO2:  98% 100% 100%  Weight: 109.6 kg     Height:        Intake/Output Summary (Last 24 hours) at 08/03/2024 1424 Last data filed at 08/03/2024 0900 Gross per 24 hour  Intake 713.7 ml  Output 300 ml  Net 413.7 ml   Filed Weights   08/01/24 0500 08/02/24 0129 08/03/24 0500  Weight: 109.9 kg 109.6 kg 109.6 kg    Examination: General: ill appearing female, laying in bed,  HENT: supple, no JVD Lungs: Fine crackles b/l bases, on 6 liter HFNC Cardiovascular: Tachycardia, regular rhythm, S1 S2, no m/r/g Abdomen: soft, benign Extremities: warm to touch throughout, dry, intact, trace edema Neuro: alert and awake, non-focal  Assessment & Plan:  Hemorrhagic Shock s/p AAA Rupture s/p Stent Placement (11/13) - resolved - Status post MTP:  Received 7 units pRBCs, 2 units FFP and 1 unit of platelets in OR - Weaned off vasopressors - s/p endovascular stent graft repair -  Vascular surgery following  Acute blood loss anemia due to AAA rupture Thrombocytopenia - Status post MTP:  Received 7 units pRBCs, 2 units FFP and 1 unit of platelets in OR - DIC ruled out ~ smear negative for schistocytes  - stable counts now.  #Acute Metabolic Encephalopathy ~  - Now resolved   # Paroxysmal Atrial Fibrillation with RVR ~ RESOLVED  #Elevated Troponin in setting of Demand ischemia - echo showing EF  65-70% Cardio following - iv heparin , amiodarone  switched to PO  #Acute Hypoxic Respiratory Failure #Mechanical Intubation for Airway Protection Intraoperatively & Metabolic Acidosis ~ EXTUBATED 88/85 #Symptomatic Pulmonary Emboli with RH Strain S/P Thrombectomy (07/29/24)  #Pulmonary Edema  #Questionable HCAP Chest CT - Saddle PE  Off BiPAP -> HFNC 6 liters now continue weaning On Eliquis    #Suspected HCAP -Continue empiric Zosyn  (plan for 7 day course) -last dose tomorrow   #Acute Kidney Injury - improving #Metabolic Acidosis s/t Hemorrhagic Shock iso AAA Rupture ~ RESOLVED #Hypocalcemia Hypokalemia Hypomagnesemia - Pharmacy c/s for assistance with electrolyte replacement   #Type II Diabetes Mellitus - SSI as indicated: Novolog   Obesity Class II - BMI 37 - Outpatient follow up for lifestyle modification and risk factor management  GOC Palliative care c/s   DVT prophylaxis: Eliquis    Code Status: Full Code Disposition:  SNF  Consultants:  Treatment Team:  Consulting Physician: Marea Selinda RAMAN, MD Consulting Physician: Oneita Rakers, MD  Procedures:  s/p endovascular stent graft repair  Antimicrobials:  Anti-infectives (From admission, onward)    Start     Dose/Rate Route Frequency Ordered Stop   07/30/24 0600  ceFAZolin  (ANCEF ) IVPB 2g/100 mL premix  Status:  Discontinued        2 g 200 mL/hr over 30 Minutes Intravenous On call to O.R. 07/29/24 1827 07/29/24 2005   07/29/24 2200  vancomycin  (VANCOCIN ) IVPB 1000 mg/200 mL premix  Status:  Discontinued        1,000 mg 200 mL/hr over 60 Minutes Intravenous Every 24 hours 07/28/24 1941 07/29/24 1537   07/29/24 1701  ceFAZolin  (ANCEF ) IVPB 2g/100 mL premix        2 g 200 mL/hr over 30 Minutes Intravenous 30 min pre-op 07/29/24 1701 07/29/24 2011   07/28/24 2200  piperacillin -tazobactam (ZOSYN ) IVPB 3.375 g        3.375 g 12.5 mL/hr over 240 Minutes Intravenous Every 8 hours 07/28/24 1902 08/05/24 0159   07/28/24  2000  vancomycin  (VANCOREADY) IVPB 2000 mg/400 mL        2,000 mg 200 mL/hr over 120 Minutes Intravenous  Once 07/28/24 1902 07/28/24 2157   07/26/24 0600  ceFAZolin  (ANCEF ) IVPB 2g/100 mL premix        2 g 200 mL/hr over 30 Minutes Intravenous On call to O.R. 07/25/24 1257 07/26/24 0804   07/25/24 2100  ceFAZolin  (ANCEF ) IVPB 2g/100 mL premix        2 g 200 mL/hr over 30 Minutes Intravenous Every 8 hours 07/25/24 1530 07/26/24 0700       Data Reviewed: I have personally reviewed following labs and imaging studies CBC: Recent Labs  Lab 07/30/24 1134 07/31/24 0421 08/01/24 0500 08/02/24 0634 08/03/24 0539  WBC 9.6 11.8* 12.0* 11.7* 12.6*  HGB 8.4* 7.9* 8.3* 9.3* 9.7*  HCT 26.1* 24.5* 26.3* 30.2* 31.3*  MCV 88.2 88.8 88.9 89.6 89.2  PLT 155 150 179 199 221   Basic Metabolic Panel: Recent Labs  Lab 07/29/24 0531 07/29/24 2019 07/30/24 0429 07/31/24 0421 07/31/24 2245  08/01/24 0500 08/02/24 0634 08/03/24 0539  NA 142 140 140 140  --  142 143 144  K 3.7 3.3* 3.7 3.3* 3.9 3.7 3.7 3.7  CL 106 102 104 104  --  103 106 107  CO2 27 29 28 28   --  30 29 31   GLUCOSE 140* 154* 132* 120*  --  116* 115* 116*  BUN 25* 25* 26* 30*  --  28* 28* 29*  CREATININE 1.13* 1.30* 1.29* 1.31*  --  1.28* 1.17* 1.22*  CALCIUM  8.6* 8.4* 8.5* 8.5*  --  8.8* 9.1 9.0  MG 2.2 2.1 2.1 2.1  --  2.0  --   --   PHOS  --  2.7 2.8 2.5  --  2.1* 3.3 2.8   GFR: Estimated Creatinine Clearance: 47.7 mL/min (A) (by C-G formula based on SCr of 1.22 mg/dL (H)). Liver Function Tests: Recent Labs  Lab 07/27/24 1706 07/29/24 0531 07/29/24 2019  AST  --  102* 99*  ALT  --  17 27  ALKPHOS  --  54 60  BILITOT  --  0.7 0.6  PROT  --  5.7* 5.4*  ALBUMIN  3.5 3.4* 3.1*   CBG: Recent Labs  Lab 08/02/24 1159 08/02/24 1705 08/02/24 2143 08/03/24 0801 08/03/24 1152  GLUCAP 155* 143* 139* 105* 164*    Recent Results (from the past 240 hours)  MRSA Next Gen by PCR, Nasal     Status: None   Collection  Time: 07/25/24  3:33 PM   Specimen: Nasal Mucosa; Nasal Swab  Result Value Ref Range Status   MRSA by PCR Next Gen NOT DETECTED NOT DETECTED Final    Comment: (NOTE) The GeneXpert MRSA Assay (FDA approved for NASAL specimens only), is one component of a comprehensive MRSA colonization surveillance program. It is not intended to diagnose MRSA infection nor to guide or monitor treatment for MRSA infections. Test performance is not FDA approved in patients less than 49 years old. Performed at [facility], [address] [place], [place] [address]   MRSA Next Gen by PCR, Nasal     Status: None   Collection Time: 07/25/24  3:57 PM   Specimen: Nasal Mucosa; Nasal Swab  Result Value Ref Range Status   MRSA by PCR Next Gen NOT DETECTED NOT DETECTED Final    Comment: (NOTE) The GeneXpert MRSA Assay (FDA approved for NASAL specimens only), is one component of a comprehensive MRSA colonization surveillance program. It is not intended to diagnose MRSA infection nor to guide or monitor treatment for MRSA infections. Test performance is not FDA approved in patients less than 39 years old. Performed at [facility], [address] [place], [place] [address]   Culture, blood (Routine X 2) w Reflex to ID Panel     Status: None   Collection Time: 07/28/24  7:39 PM   Specimen: BLOOD  Result Value Ref Range Status   Specimen Description BLOOD BLOOD LEFT HAND  Final   Special Requests   Final    BOTTLES DRAWN AEROBIC AND ANAEROBIC Blood Culture adequate volume   Culture   Final    NO GROWTH 5 DAYS Performed at [facility], [address] [place], [place] [address]    Report Status 08/02/2024 FINAL  Final  Culture, blood (Routine X 2) w Reflex to ID Panel     Status: None   Collection Time: 07/28/24  7:46 PM   Specimen: BLOOD  Result Value Ref Range Status  Specimen Description BLOOD BLOOD RIGHT HAND  Final   Special Requests   Final    BOTTLES DRAWN AEROBIC  AND ANAEROBIC Blood Culture results may not be optimal due to an inadequate volume of blood received in culture bottles   Culture   Final    NO GROWTH 5 DAYS Performed at [facility], [address] [place], [place] [address]    Report Status 08/02/2024 FINAL  Final     Radiology Studies: No results found.   Scheduled Meds:  amiodarone   400 mg Oral BID   Followed by   NOREEN ON 08/08/2024] amiodarone   200 mg Oral Daily   apixaban   10 mg Oral BID   Followed by   NOREEN ON 08/09/2024] apixaban   5 mg Oral BID   Chlorhexidine  Gluconate Cloth  6 each Topical Daily   docusate sodium   100 mg Oral Daily   dorzolamide -timolol   1 drop Both Eyes BID   feeding supplement  237 mL Oral TID BM   insulin  aspart  0-15 Units Subcutaneous TID WC   insulin  aspart  0-5 Units Subcutaneous QHS   ipratropium  0.5 mg Nebulization BID   levalbuterol   1.25 mg Nebulization BID   multivitamin with minerals  1 tablet Oral Daily   pantoprazole   40 mg Oral QHS   polyethylene glycol  17 g Oral BID   sennosides  15 mL Oral QHS   sodium chloride  flush  3 mL Intravenous Q12H   thiamine   100 mg Oral Daily   Continuous Infusions:  piperacillin -tazobactam (ZOSYN )  IV 3.375 g (08/03/24 1037)   Time Spent: 35 mins   LOS: 9 days  MDM: Patient is high risk for one or more organ failure.  They necessitate ongoing hospitalization for continued IV therapies and subsequent lab monitoring. Total time spent interpreting labs and vitals, reviewing the medical record, coordinating care amongst consultants and care team members, directly assessing and discussing care with the patient and/or family Cresencio Fairly, MD [facility]  To contact the attending physician between 7A-7P please use Epic Chat. To contact the covering physician during after hours 7P-7A, please review Amion.  08/03/2024, 2:24 PM   *This document has been created with the assistance of dictation software. Please excuse typographical  errors. *

## 2024-08-04 DIAGNOSIS — Z515 Encounter for palliative care: Secondary | ICD-10-CM | POA: Diagnosis not present

## 2024-08-04 DIAGNOSIS — R578 Other shock: Secondary | ICD-10-CM | POA: Diagnosis not present

## 2024-08-04 DIAGNOSIS — I4891 Unspecified atrial fibrillation: Secondary | ICD-10-CM | POA: Diagnosis not present

## 2024-08-04 DIAGNOSIS — I509 Heart failure, unspecified: Secondary | ICD-10-CM | POA: Diagnosis not present

## 2024-08-04 DIAGNOSIS — I713 Abdominal aortic aneurysm, ruptured, unspecified: Secondary | ICD-10-CM | POA: Diagnosis not present

## 2024-08-04 DIAGNOSIS — R0602 Shortness of breath: Secondary | ICD-10-CM | POA: Diagnosis not present

## 2024-08-04 DIAGNOSIS — J9601 Acute respiratory failure with hypoxia: Secondary | ICD-10-CM | POA: Diagnosis not present

## 2024-08-04 DIAGNOSIS — I2602 Saddle embolus of pulmonary artery with acute cor pulmonale: Secondary | ICD-10-CM | POA: Diagnosis not present

## 2024-08-04 LAB — BASIC METABOLIC PANEL WITH GFR
Anion gap: 9 (ref 5–15)
BUN: 26 mg/dL — ABNORMAL HIGH (ref 8–23)
CO2: 32 mmol/L (ref 22–32)
Calcium: 8.9 mg/dL (ref 8.9–10.3)
Chloride: 102 mmol/L (ref 98–111)
Creatinine, Ser: 1.26 mg/dL — ABNORMAL HIGH (ref 0.44–1.00)
GFR, Estimated: 43 mL/min — ABNORMAL LOW (ref >60)
Glucose, Bld: 120 mg/dL — ABNORMAL HIGH (ref 70–99)
Potassium: 3.2 mmol/L — ABNORMAL LOW (ref 3.5–5.1)
Sodium: 143 mmol/L (ref 135–145)

## 2024-08-04 LAB — GLUCOSE, CAPILLARY
Glucose-Capillary: 118 mg/dL — ABNORMAL HIGH (ref 70–99)
Glucose-Capillary: 192 mg/dL — ABNORMAL HIGH (ref 70–99)
Glucose-Capillary: 103 mg/dL — ABNORMAL HIGH (ref 70–99)
Glucose-Capillary: 137 mg/dL — ABNORMAL HIGH (ref 70–99)

## 2024-08-04 LAB — CBC
HCT: 32 % — ABNORMAL LOW (ref 36.0–46.0)
Hemoglobin: 10.1 g/dL — ABNORMAL LOW (ref 12.0–15.0)
MCH: 28 pg (ref 26.0–34.0)
MCHC: 31.6 g/dL (ref 30.0–36.0)
MCV: 88.6 fL (ref 80.0–100.0)
Platelets: 243 K/uL (ref 150–400)
RBC: 3.61 MIL/uL — ABNORMAL LOW (ref 3.87–5.11)
RDW: 16.7 % — ABNORMAL HIGH (ref 11.5–15.5)
WBC: 13.4 K/uL — ABNORMAL HIGH (ref 4.0–10.5)
nRBC: 1.4 % — ABNORMAL HIGH (ref 0.0–0.2)

## 2024-08-04 NOTE — Progress Notes (Addendum)
 PROGRESS NOTE    Rose Perry  FMW:[id number] DOB: [birth year] DOA: 07/25/2024 PCP: Marikay Eva POUR, PA  Chief Complaint  Patient presents with   Abnormal Scan    Hospital Course:  78 year old female with history of PAD, T2DM, and HTN who presented with abdominal pain found to have a ruptured abdominal aneurysm, admitted with hemorrhagic shock and taken emergency for AAA repair with vascular surgery.  Patient remained intubated postop due to severe metabolic acidosis.  Hospital course complicated by tachycardia found to have A-fib with RVR, on IV amiodarone   11/13: CT Abd/pelvis identified 7mm AAA concern for rupture, pt decompensated and was emergently taken to the vascular lab. Placed on levo, later requiring vaso, neo and epi during the procedure. MTP initiated, repeat Hgb was 6.9 (previous 13.5). Admitted to the ICU for mechanical ventilation management and hypotension requiring vasopressors. Started on fentanyl  gtt.  11/14: No significant events overnight.  AFebrile, on low dose levophed .  Hgb remained stable overnight, on minimal vent support, WUA & SBT as tolerated.  Diurese with 40 mg IV Lasix  x1 dose, hopeful for extubation to BiPAP. 11/15: Weaned off Levophed  overnight.  Developed tachycardic in afib, rate 140-150s, started on amiodarone  gtt. 07/28/24: PCCM reconsulted for acute respiratory distress with hypoxemia; patient placed on BiPAP 07/29/24: Placed on HFNC from bipap, subsequent O2 requirements increased up to 10L, later placed back on bipap. Troponin 2159, ECHO shows EF 65-70% with moderate RV enlargement; cardio consulted. 07/30/24: S/P mechanical thrombectomy for saddle pulmonary embolism. Remains on bipap; attempted to wean FiO2 to 40% overnight but pt desatted to the 80's; placed back on 60%. Will try to wean requirements and transition to [facility] today.  07/31/24: No significant events noted overnight.  FiO2 requirements decreased to 35% on HHFNC, will trial HFNC.   Creatinine stable with diuresis yesterday, UOP 1.3 L last 24 hrs (net +7.3L).  Will give 40 mg IV Lasix  x1 dose. 11/20: Transferred to [facility] and PCU. On 8-9 l HFNC 11/21: change Heparin  to Eliquis , wean off - 6 liter HFNC, tapering off Morphine  11/22: DC telemetry, DC morphine .  Transfer to any MedSurg 11/23: Completed Zosyn .  Subjective:  Feeling much better   Objective: Vitals:   08/04/24 0005 08/04/24 0430 08/04/24 0743 08/04/24 1156  BP: (!) 147/69 (!) 158/76 (!) 144/64 132/63  Pulse: 84 70 77 78  Resp: 16 16 19 19   Temp: 98.8 F (37.1 C) 98 F (36.7 C) 97.7 F (36.5 C) 97.8 F (36.6 C)  TempSrc: Oral  Oral Oral  SpO2: 97% 95% 99% 93%  Weight:  110 kg    Height:        Intake/Output Summary (Last 24 hours) at 08/04/2024 1455 Last data filed at 08/04/2024 1300 Gross per 24 hour  Intake 769.93 ml  Output 600 ml  Net 169.93 ml   Filed Weights   08/02/24 0129 08/03/24 0500 08/04/24 0430  Weight: 109.6 kg 109.6 kg 110 kg    Examination: General: ill appearing female, laying in bed,  HENT: supple, no JVD Lungs: Fine crackles b/l bases, on 6 liter HFNC Cardiovascular: Tachycardia, regular rhythm, S1 S2, no m/r/g Abdomen: soft, benign Extremities: warm to touch throughout, dry, intact, trace edema Neuro: alert and awake, non-focal  Assessment & Plan:  Hemorrhagic Shock s/p AAA Rupture s/p Stent Placement (11/13) - resolved - Status post MTP:  Received 7 units pRBCs, 2 units FFP and 1 unit of platelets in OR - Weaned off vasopressors - s/p endovascular stent graft  repair - Vascular surgery following  Acute blood loss anemia due to AAA rupture Thrombocytopenia - Status post MTP:  Received 7 units pRBCs, 2 units FFP and 1 unit of platelets in OR - DIC ruled out ~ smear negative for schistocytes  - stable counts now.  #Acute Metabolic Encephalopathy ~  - Now resolved   # Paroxysmal Atrial Fibrillation with RVR ~ RESOLVED  #Elevated Troponin in setting of Demand  ischemia - echo showing EF 65-70% Cardio following - iv heparin , amiodarone  switched to PO  #Acute Hypoxic Respiratory Failure #Mechanical Intubation for Airway Protection Intraoperatively & Metabolic Acidosis ~ EXTUBATED 88/85 #Symptomatic Pulmonary Emboli with RH Strain S/P Thrombectomy (07/29/24)  #Pulmonary Edema  #Questionable HCAP Chest CT - Saddle PE  Off BiPAP -> HFNC 6 liters now on 2 L only which is her baseline On Eliquis    #Suspected HCAP -Continue empiric Zosyn  (plan for 7 day course) -completed   #Acute Kidney Injury - improving #Metabolic Acidosis s/t Hemorrhagic Shock iso AAA Rupture ~ RESOLVED #Hypocalcemia Hypokalemia Hypomagnesemia - Pharmacy c/s for assistance with electrolyte replacement   #Type II Diabetes Mellitus - SSI as indicated: Novolog   Obesity Class II - BMI 37 - Outpatient follow up for lifestyle modification and risk factor management  GOC Palliative care seen   DVT prophylaxis: Eliquis    Code Status: Full Code Disposition:  SNF  Consultants:  Treatment Team:  Consulting Physician: Marea Selinda RAMAN, MD Consulting Physician: Oneita Rakers, MD  Procedures:  s/p endovascular stent graft repair  Antimicrobials:  Anti-infectives (From admission, onward)    Start     Dose/Rate Route Frequency Ordered Stop   07/30/24 0600  ceFAZolin  (ANCEF ) IVPB 2g/100 mL premix  Status:  Discontinued        2 g 200 mL/hr over 30 Minutes Intravenous On call to O.R. 07/29/24 1827 07/29/24 2005   07/29/24 2200  vancomycin  (VANCOCIN ) IVPB 1000 mg/200 mL premix  Status:  Discontinued        1,000 mg 200 mL/hr over 60 Minutes Intravenous Every 24 hours 07/28/24 1941 07/29/24 1537   07/29/24 1701  ceFAZolin  (ANCEF ) IVPB 2g/100 mL premix        2 g 200 mL/hr over 30 Minutes Intravenous 30 min pre-op 07/29/24 1701 07/29/24 2011   07/28/24 2200  piperacillin -tazobactam (ZOSYN ) IVPB 3.375 g        3.375 g 12.5 mL/hr over 240 Minutes Intravenous Every 8 hours  07/28/24 1902 08/05/24 0159   07/28/24 2000  vancomycin  (VANCOREADY) IVPB 2000 mg/400 mL        2,000 mg 200 mL/hr over 120 Minutes Intravenous  Once 07/28/24 1902 07/28/24 2157   07/26/24 0600  ceFAZolin  (ANCEF ) IVPB 2g/100 mL premix        2 g 200 mL/hr over 30 Minutes Intravenous On call to O.R. 07/25/24 1257 07/26/24 0804   07/25/24 2100  ceFAZolin  (ANCEF ) IVPB 2g/100 mL premix        2 g 200 mL/hr over 30 Minutes Intravenous Every 8 hours 07/25/24 1530 07/26/24 0700       Data Reviewed: I have personally reviewed following labs and imaging studies CBC: Recent Labs  Lab 07/31/24 0421 08/01/24 0500 08/02/24 0634 08/03/24 0539 08/04/24 0412  WBC 11.8* 12.0* 11.7* 12.6* 13.4*  HGB 7.9* 8.3* 9.3* 9.7* 10.1*  HCT 24.5* 26.3* 30.2* 31.3* 32.0*  MCV 88.8 88.9 89.6 89.2 88.6  PLT 150 179 199 221 243   Basic Metabolic Panel: Recent Labs  Lab 07/29/24 0531 07/29/24 2019  07/30/24 0429 07/31/24 0421 07/31/24 2245 08/01/24 0500 08/02/24 0634 08/03/24 0539 08/04/24 0412  NA 142 140 140 140  --  142 143 144 143  K 3.7 3.3* 3.7 3.3* 3.9 3.7 3.7 3.7 3.2*  CL 106 102 104 104  --  103 106 107 102  CO2 27 29 28 28   --  30 29 31  32  GLUCOSE 140* 154* 132* 120*  --  116* 115* 116* 120*  BUN 25* 25* 26* 30*  --  28* 28* 29* 26*  CREATININE 1.13* 1.30* 1.29* 1.31*  --  1.28* 1.17* 1.22* 1.26*  CALCIUM  8.6* 8.4* 8.5* 8.5*  --  8.8* 9.1 9.0 8.9  MG 2.2 2.1 2.1 2.1  --  2.0  --   --   --   PHOS  --  2.7 2.8 2.5  --  2.1* 3.3 2.8  --    GFR: Estimated Creatinine Clearance: 46.3 mL/min (A) (by C-G formula based on SCr of 1.26 mg/dL (H)). Liver Function Tests: Recent Labs  Lab 07/29/24 0531 07/29/24 2019  AST 102* 99*  ALT 17 27  ALKPHOS 54 60  BILITOT 0.7 0.6  PROT 5.7* 5.4*  ALBUMIN  3.4* 3.1*   CBG: Recent Labs  Lab 08/03/24 1152 08/03/24 1651 08/03/24 2131 08/04/24 0745 08/04/24 1200  GLUCAP 164* 117* 129* 118* 192*    Recent Results (from the past 240 hours)   MRSA Next Gen by PCR, Nasal     Status: None   Collection Time: 07/25/24  3:33 PM   Specimen: Nasal Mucosa; Nasal Swab  Result Value Ref Range Status   MRSA by PCR Next Gen NOT DETECTED NOT DETECTED Final    Comment: (NOTE) The GeneXpert MRSA Assay (FDA approved for NASAL specimens only), is one component of a comprehensive MRSA colonization surveillance program. It is not intended to diagnose MRSA infection nor to guide or monitor treatment for MRSA infections. Test performance is not FDA approved in patients less than 23 years old. Performed at [facility], [address] [place], [place] [address]   MRSA Next Gen by PCR, Nasal     Status: None   Collection Time: 07/25/24  3:57 PM   Specimen: Nasal Mucosa; Nasal Swab  Result Value Ref Range Status   MRSA by PCR Next Gen NOT DETECTED NOT DETECTED Final    Comment: (NOTE) The GeneXpert MRSA Assay (FDA approved for NASAL specimens only), is one component of a comprehensive MRSA colonization surveillance program. It is not intended to diagnose MRSA infection nor to guide or monitor treatment for MRSA infections. Test performance is not FDA approved in patients less than 53 years old. Performed at [facility], [address] [place], [place] [address]   Culture, blood (Routine X 2) w Reflex to ID Panel     Status: None   Collection Time: 07/28/24  7:39 PM   Specimen: BLOOD  Result Value Ref Range Status   Specimen Description BLOOD BLOOD LEFT HAND  Final   Special Requests   Final    BOTTLES DRAWN AEROBIC AND ANAEROBIC Blood Culture adequate volume   Culture   Final    NO GROWTH 5 DAYS Performed at [facility], [address], [place], [place] [address]    Report Status 08/02/2024 FINAL  Final  Culture, blood (Routine X 2) w Reflex to ID Panel     Status: None   Collection Time: 07/28/24  7:46 PM   Specimen: BLOOD  Result Value Ref  Range Status   Specimen Description BLOOD BLOOD RIGHT HAND   Final   Special Requests   Final    BOTTLES DRAWN AEROBIC AND ANAEROBIC Blood Culture results may not be optimal due to an inadequate volume of blood received in culture bottles   Culture   Final    NO GROWTH 5 DAYS Performed at [facility], [address], [place], [place] [address]    Report Status 08/02/2024 FINAL  Final     Radiology Studies: No results found.   Scheduled Meds:  amiodarone   400 mg Oral BID   Followed by   NOREEN ON 08/08/2024] amiodarone   200 mg Oral Daily   apixaban   10 mg Oral BID   Followed by   NOREEN ON 08/09/2024] apixaban   5 mg Oral BID   Chlorhexidine  Gluconate Cloth  6 each Topical Daily   docusate sodium   100 mg Oral Daily   dorzolamide -timolol   1 drop Both Eyes BID   feeding supplement  237 mL Oral TID BM   insulin  aspart  0-15 Units Subcutaneous TID WC   insulin  aspart  0-5 Units Subcutaneous QHS   ipratropium  0.5 mg Nebulization BID   levalbuterol   1.25 mg Nebulization BID   multivitamin with minerals  1 tablet Oral Daily   pantoprazole   40 mg Oral QHS   polyethylene glycol  17 g Oral BID   sennosides  15 mL Oral QHS   sodium chloride  flush  3 mL Intravenous Q12H   thiamine   100 mg Oral Daily   Continuous Infusions:  piperacillin -tazobactam (ZOSYN )  IV 3.375 g (08/04/24 1037)   Time Spent: 35 mins   LOS: 10 days  MDM: Patient is high risk for one or more organ failure.  They necessitate ongoing hospitalization for continued IV therapies and subsequent lab monitoring. Total time spent interpreting labs and vitals, reviewing the medical record, coordinating care amongst consultants and care team members, directly assessing and discussing care with the patient and/or family Cresencio Fairly, MD [facility]  To contact the attending physician between 7A-7P please use Epic Chat. To contact the covering physician during after hours 7P-7A, please review Amion.  08/04/2024, 2:55 PM   *This document has been created with the  assistance of dictation software. Please excuse typographical errors. *

## 2024-08-04 NOTE — TOC Progression Note (Signed)
 Transition of Care [facility]) - Progression Note    Patient Details  Name: DRISANA SCHWEICKERT MRN: [id number] Date of Birth: 02-05-1946  [facility]) CM/SW Contact  Victory Jackquline RAMAN, RN Phone Number: 08/04/2024, 10:18 AM  Clinical Narrative: RNCM obtained PASRR#: [id number] A. Pending Bed Offers.      Barriers to Discharge: Continued Medical Work up               Expected Discharge Plan and Services                                               Social Drivers of Health (SDOH) Interventions SDOH Screenings   Food Insecurity: Patient Unable To Answer (07/25/2024)  Housing: Patient Unable To Answer (07/25/2024)  Transportation Needs: Patient Unable To Answer (07/25/2024)  Utilities: Patient Unable To Answer (07/25/2024)  Financial Resource Strain: Low Risk  (01/18/2023)   Received from [facility]  Social Connections: Patient Unable To Answer (07/25/2024)  Tobacco Use: Medium Risk (07/25/2024)   Received from [facility]    Readmission Risk Interventions     No data to display

## 2024-08-04 NOTE — Plan of Care (Signed)
  Problem: Education: Goal: Knowledge of General Education information will improve Description: Including pain rating scale, medication(s)/side effects and non-pharmacologic comfort measures Outcome: Progressing   Problem: Health Behavior/Discharge Planning: Goal: Ability to manage health-related needs will improve Outcome: Progressing   Problem: Clinical Measurements: Goal: Ability to maintain clinical measurements within normal limits will improve Outcome: Progressing Goal: Will remain free from infection Outcome: Progressing Goal: Diagnostic test results will improve Outcome: Progressing Goal: Respiratory complications will improve Outcome: Progressing Goal: Cardiovascular complication will be avoided Outcome: Progressing   Problem: Activity: Goal: Risk for activity intolerance will decrease Outcome: Progressing   Problem: Nutrition: Goal: Adequate nutrition will be maintained Outcome: Progressing   Problem: Coping: Goal: Level of anxiety will decrease Outcome: Progressing   Problem: Elimination: Goal: Will not experience complications related to bowel motility Outcome: Progressing Goal: Will not experience complications related to urinary retention Outcome: Progressing   Problem: Pain Managment: Goal: General experience of comfort will improve and/or be controlled Outcome: Progressing   Problem: Safety: Goal: Ability to remain free from injury will improve Outcome: Progressing   Problem: Skin Integrity: Goal: Risk for impaired skin integrity will decrease Outcome: Progressing   Problem: Education: Goal: Ability to describe self-care measures that may prevent or decrease complications (Diabetes Survival Skills Education) will improve Outcome: Progressing Goal: Individualized Educational Video(s) Outcome: Progressing   Problem: Coping: Goal: Ability to adjust to condition or change in health will improve Outcome: Progressing   Problem: Fluid  Volume: Goal: Ability to maintain a balanced intake and output will improve Outcome: Progressing   Problem: Health Behavior/Discharge Planning: Goal: Ability to identify and utilize available resources and services will improve Outcome: Progressing Goal: Ability to manage health-related needs will improve Outcome: Progressing   Problem: Metabolic: Goal: Ability to maintain appropriate glucose levels will improve Outcome: Progressing   Problem: Nutritional: Goal: Maintenance of adequate nutrition will improve Outcome: Progressing Goal: Progress toward achieving an optimal weight will improve Outcome: Progressing   Problem: Skin Integrity: Goal: Risk for impaired skin integrity will decrease Outcome: Progressing   Problem: Tissue Perfusion: Goal: Adequacy of tissue perfusion will improve Outcome: Progressing   Problem: Education: Goal: Knowledge of discharge needs will improve Outcome: Progressing   Problem: Clinical Measurements: Goal: Postoperative complications will be avoided or minimized Outcome: Progressing   Problem: Respiratory: Goal: Will achieve and/or maintain a regular respiratory rate, without signs or symptoms of dyspnea Outcome: Progressing   Problem: Skin Integrity: Goal: Demonstration of wound healing without infection will improve Outcome: Progressing

## 2024-08-04 NOTE — Progress Notes (Signed)
 6 Days Post-Op   Subjective/Chief Complaint: Complains of LEFT side pain- states it was from  bed positioning. Denies shortness of breath, chest pain, abdominal or leg pain   Objective: Vital signs in last 24 hours: Temp:  [97.6 F (36.4 C)-98.8 F (37.1 C)] 97.7 F (36.5 C) (11/23 0743) Pulse Rate:  [70-91] 77 (11/23 0743) Resp:  [16-20] 19 (11/23 0743) BP: (132-158)/(61-76) 144/64 (11/23 0743) SpO2:  [90 %-100 %] 99 % (11/23 0743) Weight:  [110 kg] 110 kg (11/23 0430) Last BM Date : 08/03/24  Intake/Output from previous day: 11/22 0701 - 11/23 0700 In: 120 [P.O.:120] Out: 600 [Urine:600] Intake/Output this shift: Total I/O In: 589.9 [P.O.:240; IV Piggyback:349.9] Out: -   General appearance: alert and no distress Cardio: regular rate and rhythm Extremities: warm, +DP  Lab Results:  Recent Labs    08/03/24 0539 08/04/24 0412  WBC 12.6* 13.4*  HGB 9.7* 10.1*  HCT 31.3* 32.0*  PLT 221 243   BMET Recent Labs    08/03/24 0539 08/04/24 0412  NA 144 143  K 3.7 3.2*  CL 107 102  CO2 31 32  GLUCOSE 116* 120*  BUN 29* 26*  CREATININE 1.22* 1.26*  CALCIUM  9.0 8.9   PT/INR No results for input(s): LABPROT, INR in the last 72 hours. ABG No results for input(s): PHART, HCO3 in the last 72 hours.  Invalid input(s): PCO2, PO2  Studies/Results: No results found.  Anti-infectives: Anti-infectives (From admission, onward)    Start     Dose/Rate Route Frequency Ordered Stop   07/30/24 0600  ceFAZolin  (ANCEF ) IVPB 2g/100 mL premix  Status:  Discontinued        2 g 200 mL/hr over 30 Minutes Intravenous On call to O.R. 07/29/24 1827 07/29/24 2005   07/29/24 2200  vancomycin  (VANCOCIN ) IVPB 1000 mg/200 mL premix  Status:  Discontinued        1,000 mg 200 mL/hr over 60 Minutes Intravenous Every 24 hours 07/28/24 1941 07/29/24 1537   07/29/24 1701  ceFAZolin  (ANCEF ) IVPB 2g/100 mL premix        2 g 200 mL/hr over 30 Minutes Intravenous 30 min pre-op  07/29/24 1701 07/29/24 2011   07/28/24 2200  piperacillin -tazobactam (ZOSYN ) IVPB 3.375 g        3.375 g 12.5 mL/hr over 240 Minutes Intravenous Every 8 hours 07/28/24 1902 08/05/24 0159   07/28/24 2000  vancomycin  (VANCOREADY) IVPB 2000 mg/400 mL        2,000 mg 200 mL/hr over 120 Minutes Intravenous  Once 07/28/24 1902 07/28/24 2157   07/26/24 0600  ceFAZolin  (ANCEF ) IVPB 2g/100 mL premix        2 g 200 mL/hr over 30 Minutes Intravenous On call to O.R. 07/25/24 1257 07/26/24 0804   07/25/24 2100  ceFAZolin  (ANCEF ) IVPB 2g/100 mL premix        2 g 200 mL/hr over 30 Minutes Intravenous Every 8 hours 07/25/24 1530 07/26/24 0700       Assessment/Plan: POD # 9/6 s/p EVAR, Pulmonary thrombectomy  Continue OOB with PT-  Rehab planning- Ok from Vascular standpoint for discharge once otherwise medically stable and bed available  Eliquis /ASA   LOS: 10 days    Tisa Dakin A 08/04/2024

## 2024-08-04 NOTE — Progress Notes (Signed)
 Daily Progress Note   Patient Name: Rose Perry       Date: 08/04/2024 DOB: 1946/04/14  Age: 78 y.o. MRN#: [id number] Attending Physician: Maree Hue, MD Primary Care Physician: Marikay Eva POUR, PA Admit Date: 07/25/2024 Length of Stay: 10 days  Reason for Consultation/Follow-up: Establishing goals of care  Subjective:  Subjective: EMR reviewed including recent notes from hospitalist, therapy, and TOC.  Labs today with sodium 143, potassium 3.2, creatinine 1.26 white blood count 13.4, hemoglobin 10.1.  I saw and examined patient today.  She was sitting in bedside chair at time of my encounter.  Her granddaughter is present in the room as well.  She reports that she had a good night overall.  States that she is appreciative of visit.  Discussed with granddaughter who tells me that she would like to have a review of her course and conversation from yesterday.  Patient does endorse that her granddaughter would be somebody she wants to help with decision making and would like for us  to review conversation from yesterday to ensure that granddaughter has heard the information and also that patient understands care plan moving forward.    We again discussed advance care planning, clinical course, goals of care, CODE STATUS, and plan to transition to rehab at time of discharge.  Granddaughter reports she understands and agrees with all the information.  She does also state that if the patient is not able to be placed in a local rehab facility, they would rather take her home and have her placed any distance away.  Review of Systems Reports feeling well today with no concerns  Objective:   Vital Signs:  BP (!) 146/73 (BP Location: Right Arm)   Pulse 74   Temp 97.7 F (36.5 C) (Oral)   Resp 20   Ht 5' 7 (1.702 m)   Wt 110 kg   SpO2 96%   BMI 37.98 kg/m   Physical Exam: General: NAD, alert, chronically ill-appearing female Cardiovascular: RRR, no edema in LE  b/l Respiratory: no increased work of breathing noted, not in respiratory distress Abdomen: not distended Skin: no rashes or lesions on visible skin Neuro: A&O, following commands easily Psych: appropriately answers all questions  Imaging: @IMAGES @  I personally reviewed recent imaging.   Assessment & Plan:   Assessment: Rose Perry is a 78 year old female with past medical history of PAD, type 2 diabetes, hypertension who presented with abdominal pain and was found to have a ruptured aortic aneurysm.  She was admitted with hemorrhagic shock and taken for emergency AAA repair with vascular surgery.  She underwent multiple transfusions with 7 units packed red blood cells, 2 packs of plasma and 1 unit of platelets.  Postop course further complicated by A-fib with RVR.  She then developed worsening respiratory distress and subsequently found to have saddle pulmonary embolus.  She underwent mechanical thrombectomy on 11/18.  She was on high flow nasal cannula which is continued to be titrated down.  Recommendations/Plan: # Complex medical decision making/goals of care:  - Patient's granddaughter present at the bedside today. Her granddaughter is one of the people that she named as someone she would want to help with decision making. Reviewed clinical course, plan of care, and discussion regarding advance care planning and goals of care again in detail as patient's granddaughter was present today and both she and patient wanted to recap conversation yesterday.    - After reviewing it is again confirmed that patient would like to continue with full  code/full scope treatment.  Her goal is to transition to rehab, regain as much functional status as possible, and discharge home.  - She again stated she would like her sister, granddaughter, and son to function as her surrogates.  She understands that her son would have final decision making as he would be her legal surrogate unless she names somebody else  via HCPOA paperwork  - Plan for rehab at discharge.  Patient's granddaughter reports they would be open to any local facilities.  If she is not able to secure a bed locally, however, they would work to take her home with home health rather than have her any distance from family.  -  Code Status: Full Code  Prognosis: Unable to determine  # Psycho-social/Spiritual Support:  - Support System: She has a large family.  She reports that her son, sister, and granddaughter are the primary people she would want to act on her behalf. - Desire for further Chaplain support:no   # Discharge Planning:  Skilled Nursing Facility for rehab with Palliative care service follow-up    Discussed with: patient, granddaughter at bedside  Thank you for allowing the palliative care team to participate in the care Rose Perry.  Amaryllis Meissner, MD Palliative Care Provider PMT # [phone]  If patient remains symptomatic despite maximum doses, please call PMT at [phone] between 0700 and 1900. Outside of these hours, please call attending, as PMT does not have night coverage.   I personally spent a total of 38 minutes in the care of the patient today including preparing to see the patient, getting/reviewing separately obtained history, performing a medically appropriate exam/evaluation, counseling and educating, documenting clinical information in the EHR, and communicating results.

## 2024-08-04 NOTE — Progress Notes (Signed)
 SUBJECTIVE: Patient is feeling much better and says that she is not short of breath or having chest pain.   Vitals:   08/03/24 2030 08/04/24 0005 08/04/24 0430 08/04/24 0743  BP:  (!) 147/69 (!) 158/76 (!) 144/64  Pulse: 91 84 70 77  Resp: 17 16 16 19   Temp:  98.8 F (37.1 C) 98 F (36.7 C) 97.7 F (36.5 C)  TempSrc:  Oral  Oral  SpO2: 94% 97% 95% 99%  Weight:   110 kg   Height:        Intake/Output Summary (Last 24 hours) at 08/04/2024 1156 Last data filed at 08/04/2024 1036 Gross per 24 hour  Intake 589.93 ml  Output 600 ml  Net -10.07 ml    LABS: Basic Metabolic Panel: Recent Labs    08/02/24 0634 08/03/24 0539 08/04/24 0412  NA 143 144 143  K 3.7 3.7 3.2*  CL 106 107 102  CO2 29 31 32  GLUCOSE 115* 116* 120*  BUN 28* 29* 26*  CREATININE 1.17* 1.22* 1.26*  CALCIUM  9.1 9.0 8.9  PHOS 3.3 2.8  --    Liver Function Tests: No results for input(s): AST, ALT, ALKPHOS, BILITOT, PROT, ALBUMIN  in the last 72 hours. No results for input(s): LIPASE, AMYLASE in the last 72 hours. CBC: Recent Labs    08/03/24 0539 08/04/24 0412  WBC 12.6* 13.4*  HGB 9.7* 10.1*  HCT 31.3* 32.0*  MCV 89.2 88.6  PLT 221 243   Cardiac Enzymes: No results for input(s): CKTOTAL, CKMB, CKMBINDEX, TROPONINI in the last 72 hours. BNP: Invalid input(s): POCBNP D-Dimer: No results for input(s): DDIMER in the last 72 hours. Hemoglobin A1C: No results for input(s): HGBA1C in the last 72 hours. Fasting Lipid Panel: No results for input(s): CHOL, HDL, LDLCALC, TRIG, CHOLHDL, LDLDIRECT in the last 72 hours. Thyroid Function Tests: No results for input(s): TSH, T4TOTAL, T3FREE, THYROIDAB in the last 72 hours.  Invalid input(s): FREET3 Anemia Panel: No results for input(s): VITAMINB12, FOLATE, FERRITIN, TIBC, IRON , RETICCTPCT in the last 72 hours.   PHYSICAL EXAM General: Well developed, well nourished, in no acute  distress HEENT:  Normocephalic and atramatic Neck:  No JVD.  Lungs: Clear bilaterally to auscultation and percussion. Heart: HRRR . Normal S1 and S2 without gallops or murmurs.  Abdomen: Bowel sounds are positive, abdomen soft and non-tender  Msk:  Back normal, normal gait. Normal strength and tone for age. Extremities: No clubbing, cyanosis or edema.   Neuro: Alert and oriented X 3. Psych:  Good affect, responds appropriately  TELEMETRY: Sinus rhythm  ASSESSMENT AND PLAN: Rose Perry is a 78 y.o. female  with a past medical history of peripheral vascular disease, hypertension, type II diabetes who presented to the ED on 07/25/2024 for abdominal pain, found to have ruptured aortic aneurysm. Underwent repair on 07/25/2024. Over the weekend into this AM she developed worsening respiratory status. Troponin noted to be elevated. Cardiology was consulted for further evaluation.    # Elevated troponin, possibly demand # Acute hypoxic respiratory failure # Saddle pulmonary embolus # AAA with rupture s/p repair 07/25/24 # Paroxysmal atrial fibrillation # Hypertension Patient presented with abdominal pain, found to have ruptured AAA and underwent repair on 07/25/24. Yesterday developed worsening SOB and required BiPAP. Also with tachycardia - AF RVR and started on IV amiodarone . Troponins today found to be elevated at 2159 from 770 yesterday. CXR with patchy atelectasis. Net positive 7.4L since admit. Echo this admission with EF 65-70%, no WMAs, moderate  asymmetric LVH, grade I diastolic dysfunction, moderate RV enlargement with mildly reduced function, trivial MR. CTA PE yesterday with saddle PE - underwent thrombectomy  - IV heparin  per vascular. - Defer further lasix  dosing at this time. - Suspect troponin elevation secondary to demand given her saddle PE, decompensated respiratory status, recent surgery, blood loss.  Echo overall reassuring. -  amiodarone  load with 400 mg BID .   ICD-10-CM    1. Ruptured infrarenal abdominal aortic aneurysm (AAA) (HCC)  I71.33       Principal Problem:   AAA (abdominal aortic aneurysm, ruptured) (HCC) Active Problems:   Hemorrhagic shock (HCC)   Acute respiratory failure with hypoxia (HCC)   Atrial fibrillation with rapid ventricular response (HCC)   Acute saddle pulmonary embolism with acute cor pulmonale (HCC)    Denyse Bathe, MD, [facility] 08/04/2024 11:56 AM

## 2024-08-05 DIAGNOSIS — I2602 Saddle embolus of pulmonary artery with acute cor pulmonale: Secondary | ICD-10-CM | POA: Diagnosis not present

## 2024-08-05 DIAGNOSIS — J9601 Acute respiratory failure with hypoxia: Secondary | ICD-10-CM | POA: Diagnosis not present

## 2024-08-05 DIAGNOSIS — I713 Abdominal aortic aneurysm, ruptured, unspecified: Secondary | ICD-10-CM | POA: Diagnosis not present

## 2024-08-05 DIAGNOSIS — I50810 Right heart failure, unspecified: Secondary | ICD-10-CM

## 2024-08-05 DIAGNOSIS — I4891 Unspecified atrial fibrillation: Secondary | ICD-10-CM | POA: Diagnosis not present

## 2024-08-05 DIAGNOSIS — I2699 Other pulmonary embolism without acute cor pulmonale: Secondary | ICD-10-CM

## 2024-08-05 LAB — GLUCOSE, CAPILLARY
Glucose-Capillary: 136 mg/dL — ABNORMAL HIGH (ref 70–99)
Glucose-Capillary: 156 mg/dL — ABNORMAL HIGH (ref 70–99)
Glucose-Capillary: 137 mg/dL — ABNORMAL HIGH (ref 70–99)
Glucose-Capillary: 122 mg/dL — ABNORMAL HIGH (ref 70–99)

## 2024-08-05 MED ORDER — IPRATROPIUM BROMIDE 0.02 % IN SOLN: 0.5000 mg | RESPIRATORY_TRACT | Status: DC | PRN

## 2024-08-05 MED ORDER — LEVALBUTEROL HCL 1.25 MG/0.5ML IN NEBU: 1.2500 mg | INHALATION_SOLUTION | Freq: Four times a day (QID) | RESPIRATORY_TRACT | Status: DC | PRN

## 2024-08-05 NOTE — Progress Notes (Addendum)
 SATURATION QUALIFICATIONS: (This note is used to comply with regulatory documentation for home oxygen)  Patient Saturations on Room Air at Rest = 92%  Patient Saturations on Room Air while marching in place = 86%  Patient Saturations on 2 Liters of oxygen while marching in place =  92%  Please briefly explain why patient needs home oxygen: Patient would benefit from home oxygen as evidence by Spo2 below 90 when marching in place

## 2024-08-05 NOTE — TOC Progression Note (Addendum)
 Transition of Care [facility]) - Progression Note    Patient Details  Name: Rose Perry MRN: [id number] Date of Birth: 16-Mar-1946  [facility] CM/SW Contact  Dalia GORMAN Fuse, RN Phone Number: 08/05/2024, 10:01 AM  Clinical Narrative:     The patient has a bed offer from [facility] in [place]. [facility] was selected in the hub. Nitchia to start insurance auth.   TOC will continue to follow.   11:10 AM: TOC spoke with the patient in her room and provided bed offer. Patient would like to accept the bed at [facility]. TOC made Cynthia at [facility] aware. Pending AuthID: [id number]    Barriers to Discharge: Continued Medical Work up               Expected Discharge Plan and Services         Expected Discharge Date: 08/05/24                                     Social Drivers of Health (SDOH) Interventions SDOH Screenings   Food Insecurity: Patient Unable To Answer (07/25/2024)  Housing: Patient Unable To Answer (07/25/2024)  Transportation Needs: Patient Unable To Answer (07/25/2024)  Utilities: Patient Unable To Answer (07/25/2024)  Financial Resource Strain: Low Risk  (01/18/2023)   Received from [facility]  Social Connections: Patient Unable To Answer (07/25/2024)  Tobacco Use: Medium Risk (07/25/2024)   Received from [facility]    Readmission Risk Interventions     No data to display

## 2024-08-05 NOTE — Progress Notes (Signed)
 [facility] CLINIC CARDIOLOGY PROGRESS NOTE       Patient ID: Rose Perry MRN: [id number] DOB/AGE: [birth year] 78 y.o.  Admit date: 07/25/2024 Referring Physician Robet Kim, PA - PCCM Primary Physician Marikay Eva POUR, PA  Primary Cardiologist None Reason for Consultation Decompensated respiratory status, elevated troponin  HPI: Rose Perry is a 78 y.o. female  with a past medical history of peripheral vascular disease, hypertension, type II diabetes who presented to the ED on 07/25/2024 for abdominal pain, found to have ruptured aortic aneurysm. Underwent repair on 07/25/2024. Over the weekend into this AM she developed worsening respiratory status. Troponin noted to be elevated. Cardiology was consulted for further evaluation.   Interval history: -Patient seen and examined this AM, resting comfortably in hospital bed. O2 has been weaned to 2L.  -Remains in NSR with controlled HR, no events noted on tele.  -BP and HR stable this AM.   Review of systems complete and found to be negative unless listed above    Past Medical History:  Diagnosis Date   Celiac artery stenosis    CME (cystoid macular edema)    right   CNVM (choroidal neovascular membrane)    Diabetes mellitus without complication (HCC)    Diverticulosis of colon    Elevated lipids    History of cataract    History of rectal bleeding    Hx of adenomatous colonic polyps    Hyperlipidemia    Hypertension    Hypertensive retinopathy of both eyes    Lymphedema    Primary open angle glaucoma (POAG) of both eyes     Past Surgical History:  Procedure Laterality Date   ABDOMINAL HYSTERECTOMY     CENTRAL LINE INSERTION  07/29/2024   Procedure: CENTRAL LINE INSERTION;  Surgeon: Jama Cordella [name], MD;  Location: [facility] INVASIVE CV LAB;  Service: Cardiovascular;;   COLONOSCOPY     COLONOSCOPY WITH PROPOFOL  N/A 08/22/2016   Procedure: COLONOSCOPY WITH PROPOFOL ;  Surgeon: Lamar ONEIDA Holmes, MD;  Location: [facility]  ENDOSCOPY;  Service: Endoscopy;  Laterality: N/A;   COLONOSCOPY WITH PROPOFOL  N/A 08/11/2020   Procedure: COLONOSCOPY WITH PROPOFOL ;  Surgeon: Maryruth Ole ONEIDA, MD;  Location: [facility] ENDOSCOPY;  Service: Endoscopy;  Laterality: N/A;   COLONOSCOPY WITH PROPOFOL  N/A 11/12/2021   Procedure: COLONOSCOPY WITH PROPOFOL ;  Surgeon: Maryruth Ole ONEIDA, MD;  Location: [facility] ENDOSCOPY;  Service: Endoscopy;  Laterality: N/A;  DM   COLONOSCOPY WITH PROPOFOL  N/A 08/18/2023   Procedure: COLONOSCOPY WITH PROPOFOL ;  Surgeon: Maryruth Ole ONEIDA, MD;  Location: [facility] ENDOSCOPY;  Service: Endoscopy;  Laterality: N/A;   ENDOVASCULAR STENT GRAFT (AAA) N/A 07/25/2024   Procedure: ENDOVASCULAR STENT GRAFT (AAA);  Surgeon: Jama Cordella [name], MD;  Location: [facility] INVASIVE CV LAB;  Service: Cardiovascular;  Laterality: N/A;  ruptured AAA   ENDOVASCULAR STENT GRAFT REPAIR  07/25/2024   Procedure: ENDOVASCULAR STENT GRAFT REPAIR;  Surgeon: Jama Cordella [name], MD;  Location: [facility] INVASIVE CV LAB;  Service: Cardiovascular;;   ESOPHAGOGASTRODUODENOSCOPY (EGD) WITH PROPOFOL  N/A 08/11/2020   Procedure: ESOPHAGOGASTRODUODENOSCOPY (EGD) WITH PROPOFOL ;  Surgeon: Maryruth Ole ONEIDA, MD;  Location: [facility] ENDOSCOPY;  Service: Endoscopy;  Laterality: N/A;   EYE SURGERY     IVC FILTER INSERTION N/A 07/29/2024   Procedure: IVC FILTER INSERTION;  Surgeon: Jama Cordella [name], MD;  Location: [facility] INVASIVE CV LAB;  Service: Cardiovascular;  Laterality: N/A;   JOINT REPLACEMENT Left    left total knee replacement   LOWER EXTREMITY ANGIOGRAPHY Right 02/20/2024   Procedure: Lower Extremity  Angiography;  Surgeon: Jama Cordella [name], MD;  Location: [facility] INVASIVE CV LAB;  Service: Cardiovascular;  Laterality: Right;   ltkr     needle in foot removed     POLYPECTOMY  08/18/2023   Procedure: POLYPECTOMY;  Surgeon: Maryruth Ole DASEN, MD;  Location: [facility] ENDOSCOPY;  Service: Endoscopy;;   PULMONARY THROMBECTOMY Bilateral 07/29/2024   Procedure:  PULMONARY THROMBECTOMY;  Surgeon: Jama Cordella [name], MD;  Location: [facility] INVASIVE CV LAB;  Service: Cardiovascular;  Laterality: Bilateral;   REFRACTIVE SURGERY      Medications Prior to Admission  Medication Sig Dispense Refill Last Dose/Taking   amLODipine (NORVASC) 5 MG tablet Take 1 tablet by mouth daily.   07/24/2024   aspirin  EC 81 MG tablet Take 1 tablet (81 mg total) by mouth daily. Swallow whole. 150 tablet 1 07/24/2024   atorvastatin (LIPITOR) 20 MG tablet Take 1 tablet by mouth daily.   07/24/2024   clopidogrel  (PLAVIX ) 75 MG tablet Take 1 tablet (75 mg total) by mouth daily. 30 tablet 4 07/24/2024 Morning   metFORMIN (GLUCOPHAGE) 500 MG tablet Take 1 tablet by mouth 2 (two) times daily with a meal.   07/24/2024   metoprolol  tartrate (LOPRESSOR ) 25 MG tablet Take 0.5 tablets (12.5 mg total) by mouth 2 (two) times daily. 30 tablet 0 07/24/2024   pantoprazole  (PROTONIX ) 40 MG tablet Take 1 tablet (40 mg total) by mouth daily. 30 tablet 0 07/24/2024   polyethylene glycol (MIRALAX ) 17 g packet Take 17 g by mouth 2 (two) times daily. 60 each 0 Taking   potassium chloride  (KLOR-CON ) 10 MEQ tablet Take 10 mEq by mouth daily.   07/24/2024   triamterene-hydrochlorothiazide (MAXZIDE-25) 37.5-25 MG tablet Take 1 tablet by mouth daily.   07/24/2024   dorzolamide -timolol  (COSOPT ) 2-0.5 % ophthalmic solution Place 1 drop into both eyes 2 (two) times daily.   Unknown   Multiple Vitamins-Minerals (SENTRY PO) Take by mouth daily.   Unknown   Social History   Socioeconomic History   Marital status: Divorced    Spouse name: Not on file   Number of children: Not on file   Years of education: Not on file   Highest education level: Not on file  Occupational History   Not on file  Tobacco Use   Smoking status: Former   Smokeless tobacco: Never  Vaping Use   Vaping status: Never Used  Substance and Sexual Activity   Alcohol use: No   Drug use: No   Sexual activity: Not Currently  Other  Topics Concern   Not on file  Social History Narrative   There are several medical conditions patient states she has never had. This RN was deleting some when the patient asked me to stop so she could have list. The ones I deleted are AAA, arthritis, and anemia.   Social Drivers of Corporate Investment Banker Strain: Low Risk  (01/18/2023)   Received from [facility]   Overall Financial Resource Strain (CARDIA)    Difficulty of Paying Living Expenses: Not hard at all  Food Insecurity: Patient Unable To Answer (07/25/2024)   Hunger Vital Sign    Worried About Running Out of Food in the Last Year: Patient unable to answer    Ran Out of Food in the Last Year: Patient unable to answer  Transportation Needs: Patient Unable To Answer (07/25/2024)   PRAPARE - Transportation    Lack of Transportation (Medical): Patient unable to answer    Lack of Transportation (Non-Medical):  Patient unable to answer  Physical Activity: Not on file  Stress: Not on file  Social Connections: Patient Unable To Answer (07/25/2024)   Social Connection and Isolation Panel    Frequency of Communication with Friends and Family: Patient unable to answer    Frequency of Social Gatherings with Friends and Family: Patient unable to answer    Attends Religious Services: Patient unable to answer    Active Member of Clubs or Organizations: Patient unable to answer    Attends Banker Meetings: Patient unable to answer    Marital Status: Patient unable to answer  Intimate Partner Violence: Patient Unable To Answer (07/25/2024)   Humiliation, Afraid, Rape, and Kick questionnaire    Fear of Current or Ex-Partner: Patient unable to answer    Emotionally Abused: Patient unable to answer    Physically Abused: Patient unable to answer    Sexually Abused: Patient unable to answer    Family History  Problem Relation Age of Onset   Hypertension Mother    Hyperlipidemia Mother    Hypertension Father     Coronary artery disease Brother    Breast cancer Neg Hx      Vitals:   08/04/24 2357 08/05/24 0418 08/05/24 0500 08/05/24 0739  BP: (!) 145/67 (!) 144/67    Pulse: 70 68    Resp: 15 15    Temp: 97.8 F (36.6 C) (!) 97.5 F (36.4 C)    TempSrc:  Oral    SpO2: 96% 100%  94%  Weight:   111.1 kg   Height:        PHYSICAL EXAM General: Ill appearing female, well nourished, in no acute distress. HEENT: Normocephalic and atraumatic. Neck: No JVD.  Lungs: Normal respiratory effort on 2L [place].  Heart: HRRR. Normal S1 and S2 without gallops or murmurs.  Abdomen: Non-distended appearing.  Msk: Normal strength and tone for age. Extremities: Warm and well perfused. No clubbing, cyanosis. No edema.  Neuro: Alert and oriented X 3. Psych: Answers questions appropriately.   Labs: Basic Metabolic Panel: Recent Labs    08/03/24 0539 08/04/24 0412  NA 144 143  K 3.7 3.2*  CL 107 102  CO2 31 32  GLUCOSE 116* 120*  BUN 29* 26*  CREATININE 1.22* 1.26*  CALCIUM  9.0 8.9  PHOS 2.8  --    Liver Function Tests: No results for input(s): AST, ALT, ALKPHOS, BILITOT, PROT, ALBUMIN  in the last 72 hours.  No results for input(s): LIPASE, AMYLASE in the last 72 hours. CBC: Recent Labs    08/03/24 0539 08/04/24 0412  WBC 12.6* 13.4*  HGB 9.7* 10.1*  HCT 31.3* 32.0*  MCV 89.2 88.6  PLT 221 243   Cardiac Enzymes: No results for input(s): CKTOTAL, CKMB, CKMBINDEX, TROPONINIHS in the last 72 hours. BNP: No results for input(s): BNP in the last 72 hours. D-Dimer: No results for input(s): DDIMER in the last 72 hours. Hemoglobin A1C: No results for input(s): HGBA1C in the last 72 hours. Fasting Lipid Panel: No results for input(s): CHOL, HDL, LDLCALC, TRIG, CHOLHDL, LDLDIRECT in the last 72 hours. Thyroid Function Tests: No results for input(s): TSH, T4TOTAL, T3FREE, THYROIDAB in the last 72 hours.  Invalid input(s): FREET3 Anemia  Panel: No results for input(s): VITAMINB12, FOLATE, FERRITIN, TIBC, IRON , RETICCTPCT in the last 72 hours.   Radiology: [facility] Chest Port 1 View Result Date: 07/31/2024 EXAM: 1 VIEW(S) XRAY OF THE CHEST 07/31/2024 04:11:00 AM COMPARISON: 07/29/2024 CLINICAL HISTORY: Shortness of breath FINDINGS: LINES, TUBES  AND DEVICES: Right IJ CVC in place with tip overlying right atrium. LUNGS AND PLEURA: Diffuse interstitial prominence with patchy bibasilar opacities. Small right pleural effusion. No pneumothorax. HEART AND MEDIASTINUM: Stable cardiomegaly. Aortic atherosclerosis. BONES AND SOFT TISSUES: No acute osseous abnormality. IMPRESSION: 1. Diffuse interstitial prominence with patchy bibasilar opacities, which may reflect edema or infection, and a small right pleural effusion. Electronically signed by: Waddell Calk MD 07/31/2024 05:58 AM EST RP Workstation: [id number]   US  Venous Img Lower Bilateral (DVT) Result Date: 07/30/2024 CLINICAL DATA:  Pulmonary embolism. EXAM: BILATERAL LOWER EXTREMITY VENOUS DOPPLER ULTRASOUND TECHNIQUE: Gray-scale sonography with graded compression, as well as color Doppler and duplex ultrasound were performed to evaluate the lower extremity deep venous systems from the level of the common femoral vein and including the common femoral, femoral, profunda femoral, popliteal and calf veins including the posterior tibial, peroneal and gastrocnemius veins when visible. The superficial great saphenous vein was also interrogated. Spectral Doppler was utilized to evaluate flow at rest and with distal augmentation maneuvers in the common femoral, femoral and popliteal veins. COMPARISON:  None Available. FINDINGS: RIGHT LOWER EXTREMITY Common Femoral Vein: Small amount of echogenic nonocclusive thrombus. Saphenofemoral Junction: No evidence of thrombus. Normal compressibility and flow on color Doppler imaging. Profunda Femoral Vein: No evidence of thrombus. Normal compressibility and  flow on color Doppler imaging. Femoral Vein: No evidence of thrombus. Normal compressibility, respiratory phasicity and response to augmentation. Popliteal Vein: Nonocclusive thrombus in the mid popliteal vein. Calf Veins: Nonocclusive thrombus visualized in posterior tibial and peroneal veins. Superficial Great Saphenous Vein: No evidence of thrombus. Normal compressibility. Venous Reflux:  None. Other Findings: No evidence of superficial thrombophlebitis or abnormal fluid collection. LEFT LOWER EXTREMITY Common Femoral Vein: No evidence of thrombus. Normal compressibility, respiratory phasicity and response to augmentation. Saphenofemoral Junction: No evidence of thrombus. Normal compressibility and flow on color Doppler imaging. Profunda Femoral Vein: No evidence of thrombus. Normal compressibility and flow on color Doppler imaging. Femoral Vein: No evidence of thrombus. Normal compressibility, respiratory phasicity and response to augmentation. Popliteal Vein: No evidence of thrombus. Normal compressibility, respiratory phasicity and response to augmentation. Calf Veins: No evidence of thrombus. Normal compressibility and flow on color Doppler imaging. Superficial Great Saphenous Vein: No evidence of thrombus. Normal compressibility. Venous Reflux:  None. Other Findings: No evidence of superficial thrombophlebitis or abnormal fluid collection. IMPRESSION: Small amount of nonocclusive thrombus in the right common femoral vein. Nonocclusive thrombus in the right popliteal, posterior tibial and peroneal veins. Electronically Signed   By: Marcey Moan M.D.   On: 07/30/2024 11:52   PERIPHERAL VASCULAR CATHETERIZATION Result Date: 07/29/2024 See surgical note for result.  CARDIAC CATHETERIZATION Result Date: 07/29/2024 See surgical note for result.  CT Angio Chest Pulmonary Embolism (PE) W or WO Contrast Result Date: 07/29/2024 CLINICAL DATA:  Acute respiratory failure EXAM: CT ANGIOGRAPHY CHEST WITH  CONTRAST TECHNIQUE: Multidetector CT imaging of the chest was performed using the standard protocol during bolus administration of intravenous contrast. Multiplanar CT image reconstructions and MIPs were obtained to evaluate the vascular anatomy. RADIATION DOSE REDUCTION: This exam was performed according to the departmental dose-optimization program which includes automated exposure control, adjustment of the mA and/or kV according to patient size and/or use of iterative reconstruction technique. CONTRAST:  75mL OMNIPAQUE  IOHEXOL  350 MG/ML SOLN COMPARISON:  CT chest 07/28/2024. FINDINGS: Cardiovascular: There is adequate opacification of the pulmonary arteries. A saddle pulmonary embolism is identified extending into segmental branches of all lobes bilaterally. The heart is moderately enlarged.  There is a small pericardial effusion. Aorta is normal in size. There are atherosclerotic calcifications of the aorta. Mediastinum/Nodes: No enlarged mediastinal, hilar, or axillary lymph nodes. Thyroid gland, trachea, and esophagus demonstrate no significant findings. Lungs/Pleura: Mild emphysema present. There is a stable 1 cm left upper lobe pulmonary nodule. Trace left and small right pleural effusions are present. There is right lower lobe airspace consolidation and collapse. There is no pneumothorax. Upper Abdomen: There is moderate ascites in the upper abdomen. Musculoskeletal: No chest wall abnormality. No acute or significant osseous findings. Review of the MIP images confirms the above findings. IMPRESSION: 1. Saddle pulmonary embolism extending into segmental branches of all lobes bilaterally. Positive for acute PE with CTevidence of right heart strain (RV/LV Ratio = 1.2) consistent with at least submassive (intermediate risk) PE. The presence of right heart strain has been associated with an increased risk of morbidity and mortality. 2. Moderate cardiomegaly. 3. Small pericardial effusion. 4. Trace left and small  right pleural effusions. 5. Right lower lobe airspace consolidation and collapse. 6. Moderate ascites in the upper abdomen. Aortic Atherosclerosis (ICD10-I70.0). Electronically Signed   By: Greig Pique M.D.   On: 07/29/2024 15:58   DG Chest Port 1 View Result Date: 07/29/2024 CLINICAL DATA:  Dyspnea. EXAM: PORTABLE CHEST 1 VIEW COMPARISON:  07/28/2024.  Chest CT dated 07/28/2024. FINDINGS: Poor inspiration. Stable mildly enlarged cardiac silhouette. Mildly increased patchy density at the right lung base. Minimally improved left lower lobe atelectasis. Thoracic spine degenerative changes. IMPRESSION: 1. Mildly increased patchy atelectasis or pneumonia at the right lung base. 2. Minimally improved left lower lobe atelectasis. 3. Stable mild cardiomegaly. Electronically Signed   By: Elspeth Bathe M.D.   On: 07/29/2024 14:20   ECHOCARDIOGRAM COMPLETE Result Date: 07/29/2024    ECHOCARDIOGRAM REPORT   Patient Name:   NUSAIBA GUALLPA Date of Exam: 07/29/2024 Medical Rec #:  [id number]      Height:       67.0 in Accession #:    [id number]     Weight:       236.3 lb Date of Birth:  10/31/1945     BSA:          2.171 m Patient Age:    78 years       BP:           116/59 mmHg Patient Gender: F              HR:           103 bpm. Exam Location:  [facility] Procedure: 2D Echo, Cardiac Doppler and Color Doppler (Both Spectral and Color            Flow Doppler were utilized during procedure). Indications:     Dyspnea R06.00  History:         Patient has no prior history of Echocardiogram examinations.                  Risk Factors:Diabetes and Hypertension.  Sonographer:     Christopher Furnace Referring Phys:  [id number] MAGDALENE S TUKOV-YUAL Diagnosing Phys: Dwayne D Callwood MD IMPRESSIONS  1. Left ventricular ejection fraction, by estimation, is 65 to 70%. The left ventricle has normal function. The left ventricle has no regional wall motion abnormalities. There is moderate asymmetric left ventricular hypertrophy. Left ventricular  diastolic parameters are consistent with Grade I diastolic dysfunction (impaired relaxation).  2. Right ventricular systolic function is mildly reduced. The right ventricular size is moderately enlarged.  3. Left atrial size was mildly dilated.  4. Right atrial size was mildly dilated.  5. The mitral valve is normal in structure. Trivial mitral valve regurgitation.  6. The aortic valve is normal in structure. Aortic valve regurgitation is trivial. Aortic valve sclerosis/calcification is present, without any evidence of aortic stenosis. FINDINGS  Left Ventricle: Left ventricular ejection fraction, by estimation, is 65 to 70%. The left ventricle has normal function. The left ventricle has no regional wall motion abnormalities. Strain was performed and the global longitudinal strain is indeterminate. The left ventricular internal cavity size was normal in size. There is moderate asymmetric left ventricular hypertrophy. Left ventricular diastolic parameters are consistent with Grade I diastolic dysfunction (impaired relaxation). Right Ventricle: The right ventricular size is moderately enlarged. No increase in right ventricular wall thickness. Right ventricular systolic function is mildly reduced. Left Atrium: Left atrial size was mildly dilated. Right Atrium: Right atrial size was mildly dilated. Pericardium: There is no evidence of pericardial effusion. Mitral Valve: The mitral valve is normal in structure. Trivial mitral valve regurgitation. MV peak gradient, 5.3 mmHg. The mean mitral valve gradient is 2.0 mmHg. Tricuspid Valve: The tricuspid valve is normal in structure. Tricuspid valve regurgitation is trivial. Aortic Valve: The aortic valve is normal in structure. Aortic valve regurgitation is trivial. Aortic valve sclerosis/calcification is present, without any evidence of aortic stenosis. Aortic valve mean gradient measures 4.0 mmHg. Aortic valve peak gradient measures 8.3 mmHg. Aortic valve area, by VTI  measures 2.62 cm. Pulmonic Valve: The pulmonic valve was normal in structure. Pulmonic valve regurgitation is not visualized. Aorta: The ascending aorta was not well visualized. IAS/Shunts: No atrial level shunt detected by color flow Doppler. Additional Comments: 3D was performed not requiring image post processing on an independent workstation and was indeterminate.  LEFT VENTRICLE PLAX 2D LVIDd:         4.70 cm   Diastology LVIDs:         2.70 cm   LV e' medial:    5.33 cm/s LV PW:         1.00 cm   LV E/e' medial:  10.3 LV IVS:        1.40 cm   LV e' lateral:   7.07 cm/s LVOT diam:     2.10 cm   LV E/e' lateral: 7.8 LV SV:         48 LV SV Index:   22 LVOT Area:     3.46 cm LV IVRT:       77 msec  RIGHT VENTRICLE RV Basal diam:  4.20 cm     PULMONARY VEINS RV Mid diam:    3.60 cm     Diastolic Velocity: 17.00 cm/s RV S prime:     13.40 cm/s  S/D Velocity:       2.60 TAPSE (M-mode): 1.8 cm      Systolic Velocity:  43.40 cm/s LEFT ATRIUM           Index        RIGHT ATRIUM           Index LA diam:      4.00 cm 1.84 cm/m   RA Area:     16.80 cm LA Vol (A2C): 31.4 ml 14.46 ml/m  RA Volume:   45.30 ml  20.87 ml/m LA Vol (A4C): 88.4 ml 40.72 ml/m  AORTIC VALVE AV Area (Vmax):    2.50 cm AV Area (Vmean):   2.35 cm AV Area (VTI):  2.62 cm AV Vmax:           144.00 cm/s AV Vmean:          96.100 cm/s AV VTI:            0.184 m AV Peak Grad:      8.3 mmHg AV Mean Grad:      4.0 mmHg LVOT Vmax:         104.00 cm/s LVOT Vmean:        65.200 cm/s LVOT VTI:          0.139 m LVOT/AV VTI ratio: 0.76  AORTA Ao Root diam: 3.20 cm MITRAL VALVE               TRICUSPID VALVE MV Area (PHT): 3.76 cm    TR Peak grad:   44.6 mmHg MV Area VTI:   2.56 cm    TR Vmax:        334.00 cm/s MV Peak grad:  5.3 mmHg MV Mean grad:  2.0 mmHg    SHUNTS MV Vmax:       1.15 m/s    Systemic VTI:  0.14 m MV Vmean:      69.7 cm/s   Systemic Diam: 2.10 cm MV Decel Time: 202 msec MV E velocity: 55.00 cm/s MV A velocity: 96.40 cm/s MV E/A  ratio:  0.57 Cara JONETTA Lovelace MD Electronically signed by Cara JONETTA Lovelace MD Signature Date/Time: 07/29/2024/1:48:43 PM    Final    CT CHEST WO CONTRAST Result Date: 07/28/2024 EXAM: CT CHEST WITHOUT CONTRAST 07/28/2024 06:19:00 PM TECHNIQUE: CT of the chest was performed without the administration of intravenous contrast. Multiplanar reformatted images are provided for review. Automated exposure control, iterative reconstruction, and/or weight based adjustment of the mA/kV was utilized to reduce the radiation dose to as low as reasonably achievable. COMPARISON: None available. CLINICAL HISTORY: Pneumonia, complication suspected, xray done; Respiratory illness, nondiagnostic xray. FINDINGS: MEDIASTINUM: Small pericardial effusion. Atheromatous calcifications of aorta and coronary arteries. The central airways are clear. LYMPH NODES: No mediastinal, hilar or axillary lymphadenopathy. LUNGS AND PLEURA: Irregularly marginated 1.3 cm pleural-based left apical nodule. Atelectasis or dense alveolar consolidation of the right lower lobe. Small right-sided pleural effusion. No pneumothorax. SOFT TISSUES/BONES: Thoracic degenerative disc disease. No acute abnormality of the soft tissues. UPPER ABDOMEN: Limited images of the upper abdomen demonstrate ascites. IMPRESSION: 1. Atelectasis or dense alveolar consolidation of the right lower lobe 2. Irregularly marginated 1.3 cm (13 mm) pleural-based left apical pulmonary nodule; per Fleischner Society Guidelines for a single solid nodule 8.120 mm, recommend further evaluation with non-contrast chest CT at 3 months, PET/CT, or tissue sampling depending on clinical risk and morphology 3. Small right-sided pleural effusion 4. Small pericardial effusion Electronically signed by: Fonda Field MD 07/28/2024 06:27 PM EST RP Workstation: [id number]   DG Chest 1 View Result Date: 07/28/2024 CLINICAL DATA:  Shortness of breath. Abdominal aortic aneurysm rupture and repair.  EXAM: CHEST  1 VIEW COMPARISON:  07/27/2024. FINDINGS: The heart size and mediastinal contours are stable. There is atherosclerotic calcification aorta. Lung volumes are low. Hazy opacity is noted at the right lung base. There is mild atelectasis at the left lung base. No pneumothorax is seen. No acute osseous abnormality. IMPRESSION: 1. Hazy opacity at the right lung base, possible atelectasis, infiltrate, and or pleural effusion. 2. Mild atelectasis at the left lung base. Electronically Signed   By: Leita Birmingham M.D.   On: 07/28/2024 14:52   DG Chest [facility]  1 View Result Date: 07/27/2024 CLINICAL DATA:  Respiratory failure with hypoxia. EXAM: PORTABLE CHEST 1 VIEW COMPARISON:  07/26/2024 FINDINGS: Lungs are hypoinflated with mild hazy bibasilar opacification likely due to atelectasis and possible small amount of bilateral pleural fluid. Infection in the lung bases is possible. Borderline stable cardiomegaly. Remainder of the exam is unchanged. IMPRESSION: Hypoinflation with mild hazy bibasilar opacification likely due to atelectasis and possible small amount of bilateral pleural fluid. Infection in the lung bases is possible. Electronically Signed   By: Toribio Agreste M.D.   On: 07/27/2024 11:15   DG Chest Port 1 View Result Date: 07/26/2024 CLINICAL DATA:  Respiratory with hypoxia. EXAM: PORTABLE CHEST 1 VIEW COMPARISON:  Radiographs 07/25/2024.  Abdominal CT 07/25/2024. FINDINGS: 0813 hours. Tip of the endotracheal tube is 5.7 cm above the carina. Enteric tube has been advanced with the side hole below the gastroesophageal junction. Right IJ central venous catheter projects to the inferior right atrium. Persistent low lung volumes with patchy opacities at both lung bases, likely atelectasis. Stable mild blunting of the left costophrenic angle without significant pleural effusion on recent CT. Stable cardiomegaly and mediastinal contours. IMPRESSION: 1. Interval advancement of enteric tube with side hole  below the gastroesophageal junction. 2. No other significant changes. Persistent low lung volumes with bibasilar atelectasis. Electronically Signed   By: Elsie Perone M.D.   On: 07/26/2024 12:05   PERIPHERAL VASCULAR CATHETERIZATION Result Date: 07/25/2024 See surgical note for result.  DG Abd 1 View Result Date: 07/25/2024 CLINICAL DATA:  OG placement EXAM: ABDOMEN - 1 VIEW COMPARISON:  CT 07/25/2024 FINDINGS: Right-sided central venous catheter tip at the low right atrium. Enteric tube tip folded back upon itself in the region of the stomach. Nonobstructed gas pattern. Aortic stent graft is noted. IMPRESSION: Enteric tube tip folded back upon itself in the region of the stomach. Electronically Signed   By: Luke Bun M.D.   On: 07/25/2024 16:30   DG Chest Port 1 View Result Date: 07/25/2024 CLINICAL DATA:  Intubated central line EXAM: PORTABLE CHEST 1 VIEW COMPARISON:  None Available. FINDINGS: Endotracheal tube tip is about 4.5 cm superior to the carina. Enteric tube tip at the level of distal esophagus, side-port in the region of mid esophagus. Right IJ central venous catheter tip at the low right atrium. Hypoventilatory changes. Mild cardiomegaly. Possible small left effusion. Suspect patchy atelectasis at the left base. IMPRESSION: 1. Endotracheal tube tip about 4.5 cm superior to the carina. 2. Enteric tube tip at the level of distal esophagus, side-port in the region of mid esophagus. Recommend advancement. See separately dictated abdominal radiograph 3. Right IJ central venous catheter tip at the low right atrium. 4. Hypoventilatory changes with possible small left effusion and patchy atelectasis at the left base. Electronically Signed   By: Luke Bun M.D.   On: 07/25/2024 16:29   CT ABDOMEN PELVIS W CONTRAST Result Date: 07/25/2024 CLINICAL DATA:  Acute right flank pain with associated vomiting. History of abdominal aortic aneurysm measuring approximately 4.5 x 4.9 cm in  dimensions by CTA in 2021. EXAM: CT ABDOMEN AND PELVIS WITH CONTRAST TECHNIQUE: Multidetector CT imaging of the abdomen and pelvis was performed using the standard protocol following bolus administration of intravenous contrast. RADIATION DOSE REDUCTION: This exam was performed according to the departmental dose-optimization program which includes automated exposure control, adjustment of the mA and/or kV according to patient size and/or use of iterative reconstruction technique. CONTRAST:  OMNIPAQUE  IOHEXOL  300 MG/ML  SOLN COMPARISON:  CTA abdomen pelvis 08/10/2020 FINDINGS: Lower chest: No acute abnormality. Hepatobiliary: No focal liver abnormality is seen. No gallstones, gallbladder wall thickening, or biliary dilatation. Pancreas: Unremarkable. No pancreatic ductal dilatation or surrounding inflammatory changes. Spleen: Normal in size without focal abnormality. Adrenals/Urinary Tract: Adrenal glands are unremarkable. Kidneys are normal, without renal calculi, focal lesion, or hydronephrosis. Bladder is unremarkable. Stomach/Bowel: Bowel shows no evidence of obstruction, ileus, inflammation or lesion. The appendix is normal. No free intraperitoneal air. Vascular/Lymphatic: No lymphadenopathy. Significant interval enlargement infrarenal abdominal aortic aneurysm now measuring approximately 5.7 x 6.9 cm in maximum transverse dimensions with maximum oblique diameter of 7.0 cm. There is evidence to suggest acute focal leak from the right anterior aspect of the aneurysm with retroperitoneal stranding anterior to the aneurysm and to the right of the aneurysm also abutting the posterior duodenum and anterior aspect of the IVC. No dissection. Mural thrombus present in the aneurysm sac. Interval enlargement proximal right common iliac artery aneurysm now measuring up to 3.5 cm. Reproductive: Status post hysterectomy. No adnexal masses. Other: Umbilical hernia containing fat. Musculoskeletal: No acute or significant  osseous findings. IMPRESSION: 1. Interval enlargement of infrarenal abdominal aortic aneurysm now measuring up to 7 cm in greatest diameter. Evidence of acute focal retroperitoneal stranding anterior and to the right of the aneurysm consistent with focal hemorrhage/leak from the aneurysm. 2. Interval enlargement of right common iliac artery aneurysm now measuring up to 3.5 cm. 3. These results were called to the ordering clinician emergently by the CT Department and the patient will be transported to the Emergency Department. Electronically Signed   By: Marcey Moan M.D.   On: 07/25/2024 12:06    ECHO as above  TELEMETRY (personally reviewed): sinus rhythm rate 60s  EKG (personally reviewed): sinus tachycardia rate 114 bpm  Data reviewed by me 08/05/2024: last 24h vitals tele labs imaging I/O ED provider note, admission H&P, PCCM notes, vascular surgery notes  Principal Problem:   AAA (abdominal aortic aneurysm, ruptured) (HCC) Active Problems:   Hemorrhagic shock (HCC)   Acute respiratory failure with hypoxia (HCC)   Atrial fibrillation with rapid ventricular response (HCC)   Acute saddle pulmonary embolism with acute cor pulmonale (HCC)    ASSESSMENT AND PLAN:  Rose Perry is a 78 y.o. female  with a past medical history of peripheral vascular disease, hypertension, type II diabetes who presented to the ED on 07/25/2024 for abdominal pain, found to have ruptured aortic aneurysm. Underwent repair on 07/25/2024. Over the weekend into this AM she developed worsening respiratory status. Troponin noted to be elevated. Cardiology was consulted for further evaluation.   # Elevated troponin, possibly demand # Acute hypoxic respiratory failure # Saddle pulmonary embolus # AAA with rupture s/p repair 07/25/24 # Paroxysmal atrial fibrillation # Hypertension Patient presented with abdominal pain, found to have ruptured AAA and underwent repair on 07/25/24. Yesterday developed worsening SOB  and required BiPAP. Also with tachycardia - AF RVR and started on IV amiodarone . Troponins today found to be elevated at 2159 from 770 yesterday. CXR with patchy atelectasis. Net positive 7.4L since admit. Echo this admission with EF 65-70%, no WMAs, moderate asymmetric LVH, grade I diastolic dysfunction, moderate RV enlargement with mildly reduced function, trivial MR. CTA PE yesterday with saddle PE - underwent thrombectomy yesterday evening.  - Switched to PO eliquis  per vascular.  - Suspect troponin elevation secondary to demand given her saddle PE, decompensated respiratory status, recent surgery, blood loss.  Echo overall reassuring. - Continue PO amiodarone   load with 400 mg BID for 7 days followed by 200 mg daily.  - Further management per PCCM, vascular surgery.  Cardiology will sign off. Please haiku with questions or re-engage if needed. Follow up with Dr. Custovic in 1-2 weeks.   This patient's plan of care was discussed and created with Dr. Custovic and she is in agreement.  Signed: Danita Bloch, PA-C  08/05/2024, 7:46 AM [facility]

## 2024-08-05 NOTE — Progress Notes (Signed)
 Progress Note    08/05/2024 10:08 AM 7 Days Post-Op  Subjective:  Rose Perry is a 78 yo female who is now POD #7 and #4 from:   PROCEDURE: 07/25/2024 US  guidance for vascular access, bilateral femoral arteries Catheter placement into aorta from bilateral femoral approaches Catheter placement into the right internal iliac artery from the left common femoral approach third order catheter placement Coil embolization of the right internal iliac artery for treatment of the right common iliac artery aneurysm. Placement of a 28 x 14 x 12 C3 conformable Gore Excluder Endoprosthesis main body with a 16 x 10 left iliac extension limb with a 12 x 14 contralateral limb that extends into the right external iliac artery for treatment of the common iliac artery aneurysm Placement of a 28 mm proximal aortic extension cuff Selective catheterization of the left renal artery first-order catheter placement. Placement of a 7 mm x 26 mm Lifestream stent left renal artery ProGlide closure devices bilateral femoral arteries   PRE-OPERATIVE DIAGNOSIS: Ruptured juxtarenal AAA with hemodynamic shock   POST-OPERATIVE DIAGNOSIS: same   SURGEON: Cordella Shawl, MD and Selinda Gu, MD - Co-surgeons   ANESTHESIA: general   POD #7   Date of Surgery: 07/29/2024,7:54 PM   Surgeon:Schnier, Cordella [name]    Pre-operative Diagnosis: Symptomatic pulmonary emboli with right heart strain and hypoxia   Post-operative diagnosis:  Same   Procedure(s) Performed:             1.  Contrast injection right heart and bilateral pulmonary arteries             2.  Placement of a right IJ triple-lumen catheter with ultrasound guidance             3.  Mechanical thrombectomy bilateral lobar pulmonary arteries for removal of pulmonary emboli using the Penumbra CAT 8 thrombectomy catheter.             4.  Selective catheter placement right upper lobe pulmonary artery, middle lobe pulmonary artery and lower lobe pulmonary artery              5.  Selective catheter placement left upper lobe pulmonary artery and lower lobe pulmonary artery.             6.  Placement of a Denali infrarenal IVC filter   Patient states she is feeling better. No longer needing supplemental oxygen but her respiratory status is improved subjectively. No major events overnight.      Vitals:   08/05/24 0739 08/05/24 0832  BP:  137/70  Pulse:  71  Resp:  19  Temp:  97.7 F (36.5 C)  SpO2: 94% 96%   Physical Exam: Cardiac: RRR with PVC's, normal S1 and S2.  No murmurs appreciated. Lungs: Nonlabored breathing on Nasal Canula Oxygen 2 liters.  No rales rhonchi or wheezing noted. Diminished lung sounds in the right lower and middle lobe.  Incisions: Bilateral groin puncture sites.  Dressings clean dry and intact and without any hematoma seroma to note. Extremities: All extremities are warm to touch.  Positive palpable pulses in bilateral lower extremities. Abdomen: Positive bowel sounds throughout, patient's mid abdomen remains tender upon palpation but this is expected.  Nondistended this morning. Neurologic: Alert and oriented X 3. Answers all questions and follows commands.  CBC    Component Value Date/Time   WBC 13.4 (H) 08/04/2024 0412   RBC 3.61 (L) 08/04/2024 0412   HGB 10.1 (L) 08/04/2024 0412   HGB 11.9 (L) 08/22/2013  0455   HCT 32.0 (L) 08/04/2024 0412   HCT 43.8 08/07/2013 0925   PLT 243 08/04/2024 0412   PLT 217 08/22/2013 0455   MCV 88.6 08/04/2024 0412   MCV 78 (L) 08/07/2013 0925   MCH 28.0 08/04/2024 0412   MCHC 31.6 08/04/2024 0412   RDW 16.7 (H) 08/04/2024 0412   RDW 14.4 08/07/2013 0925   LYMPHSABS 2.0 08/13/2020 0527   MONOABS 0.5 08/13/2020 0527   EOSABS 0.4 08/13/2020 0527   BASOSABS 0.0 08/13/2020 0527    BMET    Component Value Date/Time   NA 143 08/04/2024 0412   NA 137 08/23/2013 0519   K 3.2 (L) 08/04/2024 0412   K 3.4 (L) 08/23/2013 0519   CL 102 08/04/2024 0412   CL 103 08/23/2013 0519   CO2  32 08/04/2024 0412   CO2 30 08/23/2013 0519   GLUCOSE 120 (H) 08/04/2024 0412   GLUCOSE 168 (H) 08/23/2013 0519   BUN 26 (H) 08/04/2024 0412   BUN 12 08/23/2013 0519   CREATININE 1.26 (H) 08/04/2024 0412   CREATININE 1.17 08/23/2013 0519   CALCIUM  8.9 08/04/2024 0412   CALCIUM  8.2 (L) 08/23/2013 0519   GFRNONAA 43 (L) 08/04/2024 0412   GFRNONAA 48 (L) 08/23/2013 0519   GFRAA 55 (L) 08/23/2013 0519    INR    Component Value Date/Time   INR 1.1 07/29/2024 1741   INR 1.0 08/07/2013 0925     Intake/Output Summary (Last 24 hours) at 08/05/2024 1008 Last data filed at 08/05/2024 0900 Gross per 24 hour  Intake 578.19 ml  Output 10 ml  Net 568.19 ml     Assessment/Plan:  78 y.o. female is s/p See Above  7 Days Post-Op   PLAN OOB to chair, ambulate as much as tolerated. Eliquis  PO  Okay to discharge per vascular surgery when medically stable.   DVT prophylaxis:  Eliquis     [name] [facility] 08/05/2024 10:08 AM

## 2024-08-05 NOTE — Progress Notes (Signed)
 PROGRESS NOTE    Rose Perry  FMW:[id number] DOB: 1946-04-28 DOA: 07/25/2024 PCP: Marikay Eva POUR, PA  Chief Complaint  Patient presents with   Abnormal Scan    Hospital Course:  78 year old female with history of PAD, T2DM, and HTN who presented with abdominal pain found to have a ruptured abdominal aneurysm, admitted with hemorrhagic shock and taken emergency for AAA repair with vascular surgery.  Patient remained intubated postop due to severe metabolic acidosis.  Hospital course complicated by tachycardia found to have A-fib with RVR, on IV amiodarone   11/13: CT Abd/pelvis identified 7mm AAA concern for rupture, pt decompensated and was emergently taken to the vascular lab. Placed on levo, later requiring vaso, neo and epi during the procedure. MTP initiated, repeat Hgb was 6.9 (previous 13.5). Admitted to the ICU for mechanical ventilation management and hypotension requiring vasopressors. Started on fentanyl  gtt.  11/14: No significant events overnight.  AFebrile, on low dose levophed .  Hgb remained stable overnight, on minimal vent support, WUA & SBT as tolerated.  Diurese with 40 mg IV Lasix  x1 dose, hopeful for extubation to BiPAP. 11/15: Weaned off Levophed  overnight.  Developed tachycardic in afib, rate 140-150s, started on amiodarone  gtt. 07/28/24: PCCM reconsulted for acute respiratory distress with hypoxemia; patient placed on BiPAP 07/29/24: Placed on HFNC from bipap, subsequent O2 requirements increased up to 10L, later placed back on bipap. Troponin 2159, ECHO shows EF 65-70% with moderate RV enlargement; cardio consulted. 07/30/24: S/P mechanical thrombectomy for saddle pulmonary embolism. Remains on bipap; attempted to wean FiO2 to 40% overnight but pt desatted to the 80's; placed back on 60%. Will try to wean requirements and transition to [facility] today.  07/31/24: No significant events noted overnight.  FiO2 requirements decreased to 35% on HHFNC, will trial HFNC.   Creatinine stable with diuresis yesterday, UOP 1.3 L last 24 hrs (net +7.3L).  Will give 40 mg IV Lasix  x1 dose. 11/20: Transferred to [facility] and PCU. On 8-9 l HFNC 11/21: change Heparin  to Eliquis , wean off - 6 liter HFNC, tapering off Morphine  11/22: DC telemetry, DC morphine .  Transfer to any MedSurg 11/23: Completed Zosyn . 11/24: Medically stable, waiting for insurance Auth  Subjective:  No new issues.  Waiting for SNF placement   Objective: Vitals:   08/05/24 0500 08/05/24 0739 08/05/24 0832 08/05/24 1213  BP:   137/70 (!) 140/67  Pulse:   71 71  Resp:   19 15  Temp:   97.7 F (36.5 C) 98.3 F (36.8 C)  TempSrc:   Oral Oral  SpO2:  94% 96% 100%  Weight: 111.1 kg     Height:        Intake/Output Summary (Last 24 hours) at 08/05/2024 1541 Last data filed at 08/05/2024 1430 Gross per 24 hour  Intake 288.26 ml  Output 10 ml  Net 278.26 ml   Filed Weights   08/03/24 0500 08/04/24 0430 08/05/24 0500  Weight: 109.6 kg 110 kg 111.1 kg    Examination: General: ill appearing female, laying in bed,  HENT: supple, no JVD Lungs: Fine crackles b/l bases, on 6 liter HFNC Cardiovascular: Tachycardia, regular rhythm, S1 S2, no m/r/g Abdomen: soft, benign Extremities: warm to touch throughout, dry, intact, trace edema Neuro: alert and awake, non-focal  Assessment & Plan:  Hemorrhagic Shock s/p AAA Rupture s/p Stent Placement (11/13) - resolved - Status post MTP:  Received 7 units pRBCs, 2 units FFP and 1 unit of platelets in OR - Weaned off vasopressors -  s/p endovascular stent graft repair - Vascular surgery following  Acute blood loss anemia due to AAA rupture Thrombocytopenia - Status post MTP:  Received 7 units pRBCs, 2 units FFP and 1 unit of platelets in OR - DIC ruled out ~ smear negative for schistocytes  - stable counts now.  #Acute Metabolic Encephalopathy ~  - Now resolved   # Paroxysmal Atrial Fibrillation with RVR ~ RESOLVED  #Elevated Troponin in setting  of Demand ischemia - echo showing EF 65-70% Cardio following - iv heparin , amiodarone  switched to PO  #Acute Hypoxic Respiratory Failure #Mechanical Intubation for Airway Protection Intraoperatively & Metabolic Acidosis ~ EXTUBATED 88/85 #Symptomatic Pulmonary Emboli with RH Strain S/P Thrombectomy (07/29/24)  #Pulmonary Edema  #Questionable HCAP Chest CT - Saddle PE  Off BiPAP -> HFNC 6 liters now on 2 L only which is her baseline On Eliquis    #Suspected HCAP -Continue empiric Zosyn  (plan for 7 day course) -completed   #Acute Kidney Injury - improving #Metabolic Acidosis s/t Hemorrhagic Shock iso AAA Rupture ~ RESOLVED #Hypocalcemia Hypokalemia Hypomagnesemia - Pharmacy c/s for assistance with electrolyte replacement   #Type II Diabetes Mellitus - SSI as indicated: Novolog   Obesity Class II - BMI 37 - Outpatient follow up for lifestyle modification and risk factor management  GOC Palliative care seen   DVT prophylaxis: Eliquis    Code Status: Full Code Disposition:  SNF  Consultants:  Treatment Team:  Consulting Physician: Rose Selinda RAMAN, MD Consulting Physician: Rose Rakers, MD  Procedures:  s/p endovascular stent graft repair  Antimicrobials:  Anti-infectives (From admission, onward)    Start     Dose/Rate Route Frequency Ordered Stop   07/30/24 0600  ceFAZolin  (ANCEF ) IVPB 2g/100 mL premix  Status:  Discontinued        2 g 200 mL/hr over 30 Minutes Intravenous On call to O.R. 07/29/24 1827 07/29/24 2005   07/29/24 2200  vancomycin  (VANCOCIN ) IVPB 1000 mg/200 mL premix  Status:  Discontinued        1,000 mg 200 mL/hr over 60 Minutes Intravenous Every 24 hours 07/28/24 1941 07/29/24 1537   07/29/24 1701  ceFAZolin  (ANCEF ) IVPB 2g/100 mL premix        2 g 200 mL/hr over 30 Minutes Intravenous 30 min pre-op 07/29/24 1701 07/29/24 2011   07/28/24 2200  piperacillin -tazobactam (ZOSYN ) IVPB 3.375 g  Status:  Discontinued        3.375 g 12.5 mL/hr over 240  Minutes Intravenous Every 8 hours 07/28/24 1902 08/04/24 1456   07/28/24 2000  vancomycin  (VANCOREADY) IVPB 2000 mg/400 mL        2,000 mg 200 mL/hr over 120 Minutes Intravenous  Once 07/28/24 1902 07/28/24 2157   07/26/24 0600  ceFAZolin  (ANCEF ) IVPB 2g/100 mL premix        2 g 200 mL/hr over 30 Minutes Intravenous On call to O.R. 07/25/24 1257 07/26/24 0804   07/25/24 2100  ceFAZolin  (ANCEF ) IVPB 2g/100 mL premix        2 g 200 mL/hr over 30 Minutes Intravenous Every 8 hours 07/25/24 1530 07/26/24 0700       Data Reviewed: I have personally reviewed following labs and imaging studies CBC: Recent Labs  Lab 07/31/24 0421 08/01/24 0500 08/02/24 0634 08/03/24 0539 08/04/24 0412  WBC 11.8* 12.0* 11.7* 12.6* 13.4*  HGB 7.9* 8.3* 9.3* 9.7* 10.1*  HCT 24.5* 26.3* 30.2* 31.3* 32.0*  MCV 88.8 88.9 89.6 89.2 88.6  PLT 150 179 199 221 243   Basic Metabolic Panel:  Recent Labs  Lab 07/29/24 2019 07/30/24 0429 07/31/24 0421 07/31/24 2245 08/01/24 0500 08/02/24 0634 08/03/24 0539 08/04/24 0412  NA 140 140 140  --  142 143 144 143  K 3.3* 3.7 3.3* 3.9 3.7 3.7 3.7 3.2*  CL 102 104 104  --  103 106 107 102  CO2 29 28 28   --  30 29 31  32  GLUCOSE 154* 132* 120*  --  116* 115* 116* 120*  BUN 25* 26* 30*  --  28* 28* 29* 26*  CREATININE 1.30* 1.29* 1.31*  --  1.28* 1.17* 1.22* 1.26*  CALCIUM  8.4* 8.5* 8.5*  --  8.8* 9.1 9.0 8.9  MG 2.1 2.1 2.1  --  2.0  --   --   --   PHOS 2.7 2.8 2.5  --  2.1* 3.3 2.8  --    GFR: Estimated Creatinine Clearance: 46.5 mL/min (A) (by C-G formula based on SCr of 1.26 mg/dL (H)). Liver Function Tests: Recent Labs  Lab 07/29/24 2019  AST 99*  ALT 27  ALKPHOS 60  BILITOT 0.6  PROT 5.4*  ALBUMIN  3.1*   CBG: Recent Labs  Lab 08/04/24 1200 08/04/24 1649 08/04/24 2051 08/05/24 0822 08/05/24 1214  GLUCAP 192* 103* 137* 136* 156*    Recent Results (from the past 240 hours)  Culture, blood (Routine X 2) w Reflex to ID Panel     Status: None    Collection Time: 07/28/24  7:39 PM   Specimen: BLOOD  Result Value Ref Range Status   Specimen Description BLOOD BLOOD LEFT HAND  Final   Special Requests   Final    BOTTLES DRAWN AEROBIC AND ANAEROBIC Blood Culture adequate volume   Culture   Final    NO GROWTH 5 DAYS Performed at [facility], [address] [place], [place] [address]    Report Status 08/02/2024 FINAL  Final  Culture, blood (Routine X 2) w Reflex to ID Panel     Status: None   Collection Time: 07/28/24  7:46 PM   Specimen: BLOOD  Result Value Ref Range Status   Specimen Description BLOOD BLOOD RIGHT HAND  Final   Special Requests   Final    BOTTLES DRAWN AEROBIC AND ANAEROBIC Blood Culture results may not be optimal due to an inadequate volume of blood received in culture bottles   Culture   Final    NO GROWTH 5 DAYS Performed at [facility], [address], [place], [place] [address]    Report Status 08/02/2024 FINAL  Final     Radiology Studies: No results found.   Scheduled Meds:  amiodarone   400 mg Oral BID   Followed by   NOREEN ON 08/08/2024] amiodarone   200 mg Oral Daily   apixaban   10 mg Oral BID   Followed by   NOREEN ON 08/09/2024] apixaban   5 mg Oral BID   Chlorhexidine  Gluconate Cloth  6 each Topical Daily   docusate sodium   100 mg Oral Daily   dorzolamide -timolol   1 drop Both Eyes BID   feeding supplement  237 mL Oral TID BM   insulin  aspart  0-15 Units Subcutaneous TID WC   insulin  aspart  0-5 Units Subcutaneous QHS   multivitamin with minerals  1 tablet Oral Daily   pantoprazole   40 mg Oral QHS   polyethylene glycol  17 g Oral BID   sennosides  15 mL Oral QHS   sodium chloride  flush  3 mL Intravenous Q12H   thiamine   100 mg Oral Daily   Continuous Infusions:   Time Spent: 35 mins   LOS: 11 days  MDM: Patient is high risk for one or more organ failure.  They necessitate ongoing hospitalization for continued IV therapies and subsequent lab monitoring.  Total time spent interpreting labs and vitals, reviewing the medical record, coordinating care amongst consultants and care team members, directly assessing and discussing care with the patient and/or family Cresencio Fairly, MD [facility]  To contact the attending physician between 7A-7P please use Epic Chat. To contact the covering physician during after hours 7P-7A, please review Amion.  08/05/2024, 3:41 PM   *This document has been created with the assistance of dictation software. Please excuse typographical errors. *

## 2024-08-05 NOTE — Progress Notes (Signed)
 Physical Therapy Treatment Patient Details Name: Rose Perry MRN: [id number] DOB: 07/12/1946 Today's Date: 08/05/2024   History of Present Illness Pt is a 78 y/o F admitted on 07/25/24 after presenting with c/o worsening abdominal pain. Imaging was concerned for possible ruptured aneurysm. Pt emergently taken to the vascular lab for repair. Pt also being treated for hemorrhagic shock s/t AAA rupture s/p stent placement, a-fib with RVR. PMH: HTN, DM2, HLD, celiac artery stenosis. Thrombectomy on 11/17.    PT Comments  Pt was seated in recliner on 2 L o2 upon arrival. She endorses having busy morning with OT and RN care. With encouragement, agrees to PT session however is self limiting overall. She remains on 2 L o2 with sao2 dropping 1 occasion to 91%. HR < 100 bpm throughout. Pt tolerated ambulation to doorway of room and back 2 x prior to requesting seated rest. Pt will benefit from continued skilled PT to maximize her independence and activity tolerance. DC recs remain appropriate due to pt not being at her baseline abilities.     If plan is discharge home, recommend the following: A little help with walking and/or transfers;A little help with bathing/dressing/bathroom;Assistance with cooking/housework;Help with stairs or ramp for entrance   Can travel by private vehicle     No  Equipment Recommendations  None recommended by PT       Precautions / Restrictions Precautions Precautions: Fall Recall of Precautions/Restrictions: Intact Precaution/Restrictions Comments: monitor O2, HR Restrictions Weight Bearing Restrictions Per Provider Order: No     Mobility  Bed Mobility  General bed mobility comments: in recliner pre/post session    Transfers Overall transfer level: Needs assistance Equipment used: Rolling walker (2 wheels) Transfers: Sit to/from Stand Sit to Stand: Min assist, Contact guard assist  General transfer comment: vcs for improved handplacement and fwd wt shift.  MIn assist first time, CGA on 2nd attempt    Ambulation/Gait Ambulation/Gait assistance: Contact guard assist Gait Distance (Feet): 40 Feet Assistive device: Rolling walker (2 wheels) Gait Pattern/deviations: Step-through pattern Gait velocity: decreased  General Gait Details: CGA for safety. pt remains on 2 L o2 throughout. pt self limits distance due to fatigue however vitals remains WNL. sao2 > 91% with HR < 100bpm    Balance Overall balance assessment: Needs assistance Sitting-balance support: Feet supported Sitting balance-Leahy Scale: Good     Standing balance support: Bilateral upper extremity supported Standing balance-Leahy Scale: Fair       Hotel Manager: No apparent difficulties  Cognition Arousal: Alert Behavior During Therapy: WFL for tasks assessed/performed   PT - Cognitive impairments: No apparent impairments    PT - Cognition Comments: Pt is A and O x 3. Agreeable to session with encouragement however self limiting overall. Following commands: Intact      Cueing Cueing Techniques: Verbal cues     General Comments General comments (skin integrity, edema, etc.): spo2 >90% on 2 L via [place] throughout, HR 70s bpm post session      Pertinent Vitals/Pain Pain Assessment Pain Assessment: No/denies pain     PT Goals (current goals can now be found in the care plan section) Progress towards PT goals: Progressing toward goals    Frequency    Min 2X/week       AM-PAC PT 6 Clicks Mobility   Outcome Measure  Help needed turning from your back to your side while in a flat bed without using bedrails?: A Little Help needed moving from lying on your back to sitting on  the side of a flat bed without using bedrails?: A Little Help needed moving to and from a bed to a chair (including a wheelchair)?: A Little Help needed standing up from a chair using your arms (e.g., wheelchair or bedside chair)?: A Little Help needed to walk in  hospital room?: A Little Help needed climbing 3-5 steps with a railing? : A Lot 6 Click Score: 17    End of Session Equipment Utilized During Treatment: Oxygen (2 L o2 throughout session) Activity Tolerance: Patient limited by fatigue Patient left: in chair;with call bell/phone within reach;with chair alarm set;with family/visitor present (family member arrived as chartered loss adjuster left room at conclusion of session) Nurse Communication: Mobility status PT Visit Diagnosis: Muscle weakness (generalized) (M62.81);Unsteadiness on feet (R26.81)     Time: 8879-8866 PT Time Calculation (min) (ACUTE ONLY): 13 min  Charges:    $Gait Training: 8-22 mins PT General Charges $$ ACUTE PT VISIT: 1 Visit                     Rankin Essex PTA 08/05/24, 11:48 AM

## 2024-08-05 NOTE — Plan of Care (Signed)
  Problem: Education: Goal: Knowledge of General Education information will improve Description: Including pain rating scale, medication(s)/side effects and non-pharmacologic comfort measures Outcome: Progressing   Problem: Health Behavior/Discharge Planning: Goal: Ability to manage health-related needs will improve Outcome: Progressing   Problem: Clinical Measurements: Goal: Ability to maintain clinical measurements within normal limits will improve Outcome: Progressing Goal: Will remain free from infection Outcome: Progressing Goal: Diagnostic test results will improve Outcome: Progressing Goal: Respiratory complications will improve Outcome: Progressing Goal: Cardiovascular complication will be avoided Outcome: Progressing   Problem: Nutrition: Goal: Adequate nutrition will be maintained Outcome: Progressing   Problem: Coping: Goal: Level of anxiety will decrease Outcome: Progressing   Problem: Pain Managment: Goal: General experience of comfort will improve and/or be controlled Outcome: Progressing

## 2024-08-05 NOTE — Progress Notes (Signed)
 Occupational Therapy Treatment Patient Details Name: Rose Perry MRN: [id number] DOB: Dec 01, 1945 Today's Date: 08/05/2024   History of present illness Pt is a 78 y/o F admitted on 07/25/24 after presenting with c/o worsening abdominal pain. Imaging was concerned for possible ruptured aneurysm. Pt emergently taken to the vascular lab for repair. Pt also being treated for hemorrhagic shock s/t AAA rupture s/p stent placement, a-fib with RVR. PMH: HTN, DM2, HLD, celiac artery stenosis. Thrombectomy on 11/17.   OT comments  Chart reviewed to date, pt greeted in chair, agreeable to OT tx session. Pt is making progress towards goals but does continue to perform ADL/functional mobility below PLOF. She requires MIN A for STS with RW, short amb transfer with RW with MIN A to bsc. MAX A for peri care following BM. Spo2 >90% on 2L via [place] throughout, HR in 70s bpm post mobility. OT will continue to follow.       If plan is discharge home, recommend the following:  A lot of help with bathing/dressing/bathroom;Assistance with cooking/housework;Assist for transportation;Help with stairs or ramp for entrance;A little help with walking and/or transfers   Equipment Recommendations  Other (comment) (defer to next venue of care)    Recommendations for Other Services      Precautions / Restrictions Precautions Precautions: Fall Recall of Precautions/Restrictions: Intact Precaution/Restrictions Comments: monitor O2, HR Restrictions Weight Bearing Restrictions Per Provider Order: No       Mobility Bed Mobility               General bed mobility comments: NT in recliner pre/post session    Transfers Overall transfer level: Needs assistance Equipment used: Rolling walker (2 wheels) Transfers: Sit to/from Stand Sit to Stand: Min assist                 Balance Overall balance assessment: Needs assistance Sitting-balance support: Feet supported Sitting balance-Leahy Scale: Good      Standing balance support: Bilateral upper extremity supported, During functional activity, Reliant on assistive device for balance Standing balance-Leahy Scale: Fair                             ADL either performed or assessed with clinical judgement   ADL Overall ADL's : Needs assistance/impaired Eating/Feeding: Set up;Sitting           Lower Body Bathing: Maximal assistance;Sit to/from stand;Sitting/lateral leans       Lower Body Dressing: Maximal assistance   Toilet Transfer: Minimal assistance;Rolling walker (2 wheels);BSC/3in1 Toilet Transfer Details (indicate cue type and reason): short amb transfer to bsc Toileting- Clothing Manipulation and Hygiene: Maximal assistance;Sit to/from stand Toileting - Clothing Manipulation Details (indicate cue type and reason): peri care following BM on bsc     Functional mobility during ADLs: Minimal assistance;Rolling walker (2 wheels) (short amb transfers)      Extremity/Trunk Assessment              Vision       Perception     Praxis     Communication Communication Communication: No apparent difficulties   Cognition Arousal: Alert Behavior During Therapy: WFL for tasks assessed/performed Cognition: No apparent impairments                               Following commands: Intact        Cueing   Cueing Techniques: Verbal cues  Exercises Other  Exercises Other Exercises: edu re role of OT, role of rehab, discharge recommendations    Shoulder Instructions       General Comments spo2 >90% on 2 L via  throughout, HR 70s bpm post session    Pertinent Vitals/ Pain       Pain Assessment Pain Assessment: No/denies pain  Home Living                                          Prior Functioning/Environment              Frequency  Min 2X/week        Progress Toward Goals  OT Goals(current goals can now be found in the care plan section)  Progress towards  OT goals: Progressing toward goals  Acute Rehab OT Goals Time For Goal Achievement: 08/14/24  Plan      Co-evaluation                 AM-PAC OT 6 Clicks Daily Activity     Outcome Measure   Help from another person eating meals?: None Help from another person taking care of personal grooming?: None Help from another person toileting, which includes using toliet, bedpan, or urinal?: A Lot Help from another person bathing (including washing, rinsing, drying)?: A Lot Help from another person to put on and taking off regular upper body clothing?: A Little Help from another person to put on and taking off regular lower body clothing?: A Lot 6 Click Score: 17    End of Session Equipment Utilized During Treatment: Rolling walker (2 wheels);Oxygen  OT Visit Diagnosis: Unsteadiness on feet (R26.81);Repeated falls (R29.6);Muscle weakness (generalized) (M62.81)   Activity Tolerance Patient tolerated treatment well   Patient Left in chair;with call bell/phone within reach;with nursing/sitter in room   Nurse Communication Mobility status        Time: 8988-8963 OT Time Calculation (min): 25 min  Charges: OT General Charges $OT Visit: 1 Visit OT Treatments $Self Care/Home Management : 8-22 mins $Therapeutic Activity: 8-22 mins  Therisa Sheffield, OTD OTR/L  08/05/24, 10:41 AM

## 2024-08-06 DIAGNOSIS — I4891 Unspecified atrial fibrillation: Secondary | ICD-10-CM | POA: Diagnosis not present

## 2024-08-06 DIAGNOSIS — J9601 Acute respiratory failure with hypoxia: Secondary | ICD-10-CM | POA: Diagnosis not present

## 2024-08-06 DIAGNOSIS — I713 Abdominal aortic aneurysm, ruptured, unspecified: Secondary | ICD-10-CM | POA: Diagnosis not present

## 2024-08-06 DIAGNOSIS — I2602 Saddle embolus of pulmonary artery with acute cor pulmonale: Secondary | ICD-10-CM | POA: Diagnosis not present

## 2024-08-06 LAB — GLUCOSE, CAPILLARY
Glucose-Capillary: 136 mg/dL — ABNORMAL HIGH (ref 70–99)
Glucose-Capillary: 140 mg/dL — ABNORMAL HIGH (ref 70–99)
Glucose-Capillary: 127 mg/dL — ABNORMAL HIGH (ref 70–99)
Glucose-Capillary: 176 mg/dL — ABNORMAL HIGH (ref 70–99)

## 2024-08-06 MED ORDER — AMIODARONE HCL 200 MG PO TABS: ORAL_TABLET | ORAL | Status: AC

## 2024-08-06 MED ORDER — APIXABAN 5 MG PO TABS: ORAL_TABLET | ORAL | Status: AC

## 2024-08-06 NOTE — Discharge Summary (Signed)
 Physician Discharge Summary   Patient: Rose Perry MRN: [id number] DOB: [birth year]  Admit date:     07/25/2024  Discharge date: 08/06/24  Discharge Physician: Cresencio Fairly   PCP: Marikay Eva POUR, PA   Recommendations at discharge:    F/up with outpt providers as requested  Discharge Diagnoses: Principal Problem:   AAA (abdominal aortic aneurysm, ruptured) (HCC) Active Problems:   Hemorrhagic shock (HCC)   Acute respiratory failure with hypoxia (HCC)   Atrial fibrillation with rapid ventricular response (HCC)   Acute saddle pulmonary embolism with acute cor pulmonale [facility])  Hospital Course: Assessment and Plan:  78 year old female with history of PAD, T2DM, and HTN who presented with abdominal pain found to have a ruptured abdominal aneurysm, admitted with hemorrhagic shock and taken emergency for AAA repair with vascular surgery.  Patient remained intubated postop due to severe metabolic acidosis.  Hospital course complicated by tachycardia found to have A-fib with RVR, on IV amiodarone    11/13: CT Abd/pelvis identified 7mm AAA concern for rupture, pt decompensated and was emergently taken to the vascular lab. Placed on levo, later requiring vaso, neo and epi during the procedure. MTP initiated, repeat Hgb was 6.9 (previous 13.5). Admitted to the ICU for mechanical ventilation management and hypotension requiring vasopressors. Started on fentanyl  gtt.  11/14: No significant events overnight.  AFebrile, on low dose levophed .  Hgb remained stable overnight, on minimal vent support, WUA & SBT as tolerated.  Diurese with 40 mg IV Lasix  x1 dose, hopeful for extubation to BiPAP. 11/15: Weaned off Levophed  overnight.  Developed tachycardic in afib, rate 140-150s, started on amiodarone  gtt. 07/28/24: PCCM reconsulted for acute respiratory distress with hypoxemia; patient placed on BiPAP 07/29/24: Placed on HFNC from bipap, subsequent O2 requirements increased up to 10L, later placed  back on bipap. Troponin 2159, ECHO shows EF 65-70% with moderate RV enlargement; cardio consulted. 07/30/24: S/P mechanical thrombectomy for saddle pulmonary embolism. Remains on bipap; attempted to wean FiO2 to 40% overnight but pt desatted to the 80's; placed back on 60%. Will try to wean requirements and transition to [place] today.  07/31/24: No significant events noted overnight.  FiO2 requirements decreased to 35% on HHFNC, will trial HFNC.  Creatinine stable with diuresis yesterday, UOP 1.3 L last 24 hrs (net +7.3L).  Will give 40 mg IV Lasix  x1 dose. 11/20: Transferred to [facility] and PCU. On 8-9 l HFNC 11/21: change Heparin  to Eliquis , wean off - 6 liter HFNC, tapering off Morphine  11/22: DC telemetry, DC morphine .  Transfer to any MedSurg 11/23: Completed Zosyn . 11/24: Medically stable, waiting for insurance Auth  Hemorrhagic Shock s/p AAA Rupture s/p Stent Placement (11/13) - resolved - Status post MTP:  Received 7 units pRBCs, 2 units FFP and 1 unit of platelets in OR - s/p endovascular stent graft repair - outpt f/up with Vascular surgery   Acute blood loss anemia due to AAA rupture Thrombocytopenia - Status post MTP:  Received 7 units pRBCs, 2 units FFP and 1 unit of platelets in OR - DIC ruled out ~ smear negative for schistocytes  - stable counts now.   #Acute Metabolic Encephalopathy ~  - Now resolved   # Paroxysmal Atrial Fibrillation with RVR ~ RESOLVED  #Elevated Troponin in setting of [place] ischemia - echo showing EF 65-70% On Amiodarone  for rate control and eliquis  for anticoagulation   #Acute Hypoxic Respiratory Failure #Mechanical Intubation for Airway Protection Intraoperatively & Metabolic Acidosis ~ EXTUBATED 88/85 #Symptomatic Pulmonary Emboli with RH Strain  S/P Thrombectomy (07/29/24)  #Pulmonary Edema  #Questionable HCAP Chest CT - Saddle PE  Weaned Off BiPAP -> HFNC 6 liters now on 2 L only - will need it at SNF On Eliquis    #Suspected HCAP -Continue  empiric Zosyn  (plan for 7 day course) -completed   #Acute Kidney Injury - improving #Metabolic Acidosis s/t Hemorrhagic Shock iso AAA Rupture ~ RESOLVED #Hypocalcemia Hypokalemia Hypomagnesemia - repleted   #Type II Diabetes Mellitus Obesity Class II - BMI 37        Consultants: Cardio, Vascular Surgery, Palliative care Procedures performed: endovascular stent graft repair  Disposition: Skilled nursing facility Diet recommendation:  Discharge Diet Orders (From admission, onward)     Start     Ordered   08/05/24 0000  Diet - low sodium heart healthy        08/05/24 0818           Carb modified diet DISCHARGE MEDICATION: Allergies as of 08/06/2024       Reactions   Codeine         Medication List     STOP taking these medications    aspirin  EC 81 MG tablet   clopidogrel  75 MG tablet Commonly known as: Plavix    metoprolol  tartrate 25 MG tablet Commonly known as: LOPRESSOR    triamterene-hydrochlorothiazide 37.5-25 MG tablet Commonly known as: MAXZIDE-25       TAKE these medications    amiodarone  200 MG tablet Commonly known as: PACERONE  Take 2 tablets (400 mg total) by mouth 2 (two) times daily for 2 days, THEN 1 tablet (200 mg total) daily. Start taking on: August 06, 2024   amLODipine 5 MG tablet Commonly known as: NORVASC Take 1 tablet by mouth daily.   apixaban  5 MG Tabs tablet Commonly known as: ELIQUIS  Take 2 tablets (10 mg total) by mouth 2 (two) times daily for 3 days, THEN 1 tablet (5 mg total) 2 (two) times daily. Start taking on: August 06, 2024   atorvastatin 20 MG tablet Commonly known as: LIPITOR Take 1 tablet by mouth daily.   dorzolamide -timolol  2-0.5 % ophthalmic solution Commonly known as: COSOPT  Place 1 drop into both eyes 2 (two) times daily.   metFORMIN 500 MG tablet Commonly known as: GLUCOPHAGE Take 1 tablet by mouth 2 (two) times daily with a meal.   pantoprazole  40 MG tablet Commonly known as:  Protonix  Take 1 tablet (40 mg total) by mouth daily.   polyethylene glycol 17 g packet Commonly known as: MiraLax  Take 17 g by mouth 2 (two) times daily.   potassium chloride  10 MEQ tablet Commonly known as: KLOR-CON  Take 10 mEq by mouth daily.   SENTRY PO Take by mouth daily.        Contact information for follow-up providers     Custovic, Sabina, DO. Go in 1 week(s).   Specialty: Cardiology Contact information: [address] [place] [address] [phone]         Marikay Eva POUR, [place] Follow up.   Specialty: Physician Assistant Why: hospital follow up Contact information: [address] [facility] [place] [place] [address] [phone]         Marea Selinda RAMAN, MD. Schedule an appointment as soon as possible for a visit in 2 week(s).   Specialties: Vascular Surgery, Radiology, Interventional Cardiology Why: [facility] Discharge F/UP. Needs Evar Ultrasound Contact information: [address] [place] [address] [phone]  Contact information for after-discharge care     Destination     [facility] [place], [place]. [place]   Service: Skilled Nursing Contact information: [address] [place]  [address] [phone]                    Discharge Exam: Fredricka Weights   08/03/24 0500 08/04/24 0430 08/05/24 0500  Weight: 109.6 kg 110 kg 111.1 kg   General: 78 y f sitting in the chair comfortably   HENT: supple, no JVD Lungs: CTA b/l, on 2 liter O2 via [place] Cardiovascular: S1 S2 Normal, no m/r/g Abdomen: soft, benign Extremities: warm to touch throughout, dry, intact, trace edema Neuro: alert and awake, non-focal  Condition at discharge: fair  The results of significant diagnostics from this hospitalization (including imaging, microbiology, ancillary and laboratory) are listed below for reference.   Imaging Studies: DG Chest Port 1 View Result Date:  07/31/2024 EXAM: 1 VIEW(S) XRAY OF THE CHEST 07/31/2024 04:11:00 AM COMPARISON: 07/29/2024 CLINICAL HISTORY: Shortness of breath FINDINGS: LINES, TUBES AND DEVICES: Right IJ CVC in place with tip overlying right atrium. LUNGS AND PLEURA: Diffuse interstitial prominence with patchy bibasilar opacities. Small right pleural effusion. No pneumothorax. HEART AND MEDIASTINUM: Stable cardiomegaly. Aortic atherosclerosis. BONES AND SOFT TISSUES: No acute osseous abnormality. IMPRESSION: 1. Diffuse interstitial prominence with patchy bibasilar opacities, which may reflect edema or infection, and a small right pleural effusion. Electronically signed by: Waddell Calk MD 07/31/2024 05:58 AM EST RP Workstation: [id number]   US  Venous Img Lower Bilateral (DVT) Result Date: 07/30/2024 CLINICAL DATA:  Pulmonary embolism. EXAM: BILATERAL LOWER EXTREMITY VENOUS DOPPLER ULTRASOUND TECHNIQUE: Gray-scale sonography with graded compression, as well as color Doppler and duplex ultrasound were performed to evaluate the lower extremity deep venous systems from the level of the common femoral vein and including the common femoral, femoral, profunda femoral, popliteal and calf veins including the posterior tibial, peroneal and gastrocnemius veins when visible. The superficial great saphenous vein was also interrogated. Spectral Doppler was utilized to evaluate flow at rest and with distal augmentation maneuvers in the common femoral, femoral and popliteal veins. COMPARISON:  None Available. FINDINGS: RIGHT LOWER EXTREMITY Common Femoral Vein: Small amount of echogenic nonocclusive thrombus. Saphenofemoral Junction: No evidence of thrombus. Normal compressibility and flow on color Doppler imaging. Profunda Femoral Vein: No evidence of thrombus. Normal compressibility and flow on color Doppler imaging. Femoral Vein: No evidence of thrombus. Normal compressibility, respiratory phasicity and response to augmentation. Popliteal Vein:  Nonocclusive thrombus in the mid popliteal vein. Calf Veins: Nonocclusive thrombus visualized in posterior tibial and peroneal veins. Superficial Great Saphenous Vein: No evidence of thrombus. Normal compressibility. Venous Reflux:  None. Other Findings: No evidence of superficial thrombophlebitis or abnormal fluid collection. LEFT LOWER EXTREMITY Common Femoral Vein: No evidence of thrombus. Normal compressibility, respiratory phasicity and response to augmentation. Saphenofemoral Junction: No evidence of thrombus. Normal compressibility and flow on color Doppler imaging. Profunda Femoral Vein: No evidence of thrombus. Normal compressibility and flow on color Doppler imaging. Femoral Vein: No evidence of thrombus. Normal compressibility, respiratory phasicity and response to augmentation. Popliteal Vein: No evidence of thrombus. Normal compressibility, respiratory phasicity and response to augmentation. Calf Veins: No evidence of thrombus. Normal compressibility and flow on color Doppler imaging. Superficial Great Saphenous Vein: No evidence of thrombus. Normal compressibility. Venous Reflux:  None. Other Findings: No evidence of superficial thrombophlebitis or abnormal fluid collection. IMPRESSION: Small amount of nonocclusive thrombus in the right common femoral vein. Nonocclusive thrombus in the  right popliteal, posterior tibial and peroneal veins. Electronically Signed   By: Marcey Moan M.D.   On: 07/30/2024 11:52   PERIPHERAL VASCULAR CATHETERIZATION Result Date: 07/29/2024 See surgical note for result.  CARDIAC CATHETERIZATION Result Date: 07/29/2024 See surgical note for result.  CT Angio Chest Pulmonary Embolism (PE) W or WO Contrast Result Date: 07/29/2024 CLINICAL DATA:  Acute respiratory failure EXAM: CT ANGIOGRAPHY CHEST WITH CONTRAST TECHNIQUE: Multidetector CT imaging of the chest was performed using the standard protocol during bolus administration of intravenous contrast. Multiplanar  CT image reconstructions and MIPs were obtained to evaluate the vascular anatomy. RADIATION DOSE REDUCTION: This exam was performed according to the departmental dose-optimization program which includes automated exposure control, adjustment of the mA and/or kV according to patient size and/or use of iterative reconstruction technique. CONTRAST:  75mL OMNIPAQUE  IOHEXOL  350 MG/ML SOLN COMPARISON:  CT chest 07/28/2024. FINDINGS: Cardiovascular: There is adequate opacification of the pulmonary arteries. A saddle pulmonary embolism is identified extending into segmental branches of all lobes bilaterally. The heart is moderately enlarged. There is a small pericardial effusion. Aorta is normal in size. There are atherosclerotic calcifications of the aorta. Mediastinum/Nodes: No enlarged mediastinal, hilar, or axillary lymph nodes. Thyroid gland, trachea, and esophagus demonstrate no significant findings. Lungs/Pleura: Mild emphysema present. There is a stable 1 cm left upper lobe pulmonary nodule. Trace left and small right pleural effusions are present. There is right lower lobe airspace consolidation and collapse. There is no pneumothorax. Upper Abdomen: There is moderate ascites in the upper abdomen. Musculoskeletal: No chest wall abnormality. No acute or significant osseous findings. Review of the MIP images confirms the above findings. IMPRESSION: 1. Saddle pulmonary embolism extending into segmental branches of all lobes bilaterally. Positive for acute PE with CTevidence of right heart strain (RV/LV Ratio = 1.2) consistent with at least submassive (intermediate risk) PE. The presence of right heart strain has been associated with an increased risk of morbidity and mortality. 2. Moderate cardiomegaly. 3. Small pericardial effusion. 4. Trace left and small right pleural effusions. 5. Right lower lobe airspace consolidation and collapse. 6. Moderate ascites in the upper abdomen. Aortic Atherosclerosis (ICD10-I70.0).  Electronically Signed   By: Greig Pique M.D.   On: 07/29/2024 15:58   DG Chest Port 1 View Result Date: 07/29/2024 CLINICAL DATA:  Dyspnea. EXAM: PORTABLE CHEST 1 VIEW COMPARISON:  07/28/2024.  Chest CT dated 07/28/2024. FINDINGS: Poor inspiration. Stable mildly enlarged cardiac silhouette. Mildly increased patchy density at the right lung base. Minimally improved left lower lobe atelectasis. Thoracic spine degenerative changes. IMPRESSION: 1. Mildly increased patchy atelectasis or pneumonia at the right lung base. 2. Minimally improved left lower lobe atelectasis. 3. Stable mild cardiomegaly. Electronically Signed   By: Elspeth Bathe M.D.   On: 07/29/2024 14:20   ECHOCARDIOGRAM COMPLETE Result Date: 07/29/2024    ECHOCARDIOGRAM REPORT   Patient Name:   Rose Perry Date of Exam: 07/29/2024 Medical Rec #:  [id number]      Height:       67.0 in Accession #:    [id number]     Weight:       236.3 lb Date of Birth:  1945/09/22     BSA:          2.171 m Patient Age:    78 years       BP:           116/59 mmHg Patient Gender: F  HR:           103 bpm. Exam Location:  [facility] Procedure: 2D Echo, Cardiac Doppler and Color Doppler (Both Spectral and Color            Flow Doppler were utilized during procedure). Indications:     Dyspnea R06.00  History:         Patient has no prior history of Echocardiogram examinations.                  Risk Factors:Diabetes and Hypertension.  Sonographer:     Christopher Furnace Referring Phys:  [id number] MAGDALENE S TUKOV-YUAL Diagnosing Phys: Dwayne D Callwood MD IMPRESSIONS  1. Left ventricular ejection fraction, by estimation, is 65 to 70%. The left ventricle has normal function. The left ventricle has no regional wall motion abnormalities. There is moderate asymmetric left ventricular hypertrophy. Left ventricular diastolic parameters are consistent with Grade I diastolic dysfunction (impaired relaxation).  2. Right ventricular systolic function is mildly reduced. The right  ventricular size is moderately enlarged.  3. Left atrial size was mildly dilated.  4. Right atrial size was mildly dilated.  5. The mitral valve is normal in structure. Trivial mitral valve regurgitation.  6. The aortic valve is normal in structure. Aortic valve regurgitation is trivial. Aortic valve sclerosis/calcification is present, without any evidence of aortic stenosis. FINDINGS  Left Ventricle: Left ventricular ejection fraction, by estimation, is 65 to 70%. The left ventricle has normal function. The left ventricle has no regional wall motion abnormalities. Strain was performed and the global longitudinal strain is indeterminate. The left ventricular internal cavity size was normal in size. There is moderate asymmetric left ventricular hypertrophy. Left ventricular diastolic parameters are consistent with Grade I diastolic dysfunction (impaired relaxation). Right Ventricle: The right ventricular size is moderately enlarged. No increase in right ventricular wall thickness. Right ventricular systolic function is mildly reduced. Left Atrium: Left atrial size was mildly dilated. Right Atrium: Right atrial size was mildly dilated. Pericardium: There is no evidence of pericardial effusion. Mitral Valve: The mitral valve is normal in structure. Trivial mitral valve regurgitation. MV peak gradient, 5.3 mmHg. The mean mitral valve gradient is 2.0 mmHg. Tricuspid Valve: The tricuspid valve is normal in structure. Tricuspid valve regurgitation is trivial. Aortic Valve: The aortic valve is normal in structure. Aortic valve regurgitation is trivial. Aortic valve sclerosis/calcification is present, without any evidence of aortic stenosis. Aortic valve mean gradient measures 4.0 mmHg. Aortic valve peak gradient measures 8.3 mmHg. Aortic valve area, by VTI measures 2.62 cm. Pulmonic Valve: The pulmonic valve was normal in structure. Pulmonic valve regurgitation is not visualized. Aorta: The ascending aorta was not well  visualized. IAS/Shunts: No atrial level shunt detected by color flow Doppler. Additional Comments: 3D was performed not requiring image post processing on an independent workstation and was indeterminate.  LEFT VENTRICLE PLAX 2D LVIDd:         4.70 cm   Diastology LVIDs:         2.70 cm   LV e' medial:    5.33 cm/s LV PW:         1.00 cm   LV E/e' medial:  10.3 LV IVS:        1.40 cm   LV e' lateral:   7.07 cm/s LVOT diam:     2.10 cm   LV E/e' lateral: 7.8 LV SV:         48 LV SV Index:   22 LVOT Area:  3.46 cm LV IVRT:       77 msec  RIGHT VENTRICLE RV Basal diam:  4.20 cm     PULMONARY VEINS RV Mid diam:    3.60 cm     Diastolic Velocity: 17.00 cm/s RV S prime:     13.40 cm/s  S/D Velocity:       2.60 TAPSE (M-mode): 1.8 cm      Systolic Velocity:  43.40 cm/s LEFT ATRIUM           Index        RIGHT ATRIUM           Index LA diam:      4.00 cm 1.84 cm/m   RA Area:     16.80 cm LA Vol (A2C): 31.4 ml 14.46 ml/m  RA Volume:   45.30 ml  20.87 ml/m LA Vol (A4C): 88.4 ml 40.72 ml/m  AORTIC VALVE AV Area (Vmax):    2.50 cm AV Area (Vmean):   2.35 cm AV Area (VTI):     2.62 cm AV Vmax:           144.00 cm/s AV Vmean:          96.100 cm/s AV VTI:            0.184 m AV Peak Grad:      8.3 mmHg AV Mean Grad:      4.0 mmHg LVOT Vmax:         104.00 cm/s LVOT Vmean:        65.200 cm/s LVOT VTI:          0.139 m LVOT/AV VTI ratio: 0.76  AORTA Ao Root diam: 3.20 cm MITRAL VALVE               TRICUSPID VALVE MV Area (PHT): 3.76 cm    TR Peak grad:   44.6 mmHg MV Area VTI:   2.56 cm    TR Vmax:        334.00 cm/s MV Peak grad:  5.3 mmHg MV Mean grad:  2.0 mmHg    SHUNTS MV Vmax:       1.15 m/s    Systemic VTI:  0.14 m MV Vmean:      69.7 cm/s   Systemic Diam: 2.10 cm MV Decel Time: 202 msec MV E velocity: 55.00 cm/s MV A velocity: 96.40 cm/s MV E/A ratio:  0.57 Dwayne D Callwood MD Electronically signed by Cara JONETTA Lovelace MD Signature Date/Time: 07/29/2024/1:48:43 PM    Final    CT CHEST WO CONTRAST Result  Date: 07/28/2024 EXAM: CT CHEST WITHOUT CONTRAST 07/28/2024 06:19:00 PM TECHNIQUE: CT of the chest was performed without the administration of intravenous contrast. Multiplanar reformatted images are provided for review. Automated exposure control, iterative reconstruction, and/or weight based adjustment of the mA/kV was utilized to reduce the radiation dose to as low as reasonably achievable. COMPARISON: None available. CLINICAL HISTORY: Pneumonia, complication suspected, xray done; Respiratory illness, nondiagnostic xray. FINDINGS: MEDIASTINUM: Small pericardial effusion. Atheromatous calcifications of aorta and coronary arteries. The central airways are clear. LYMPH NODES: No mediastinal, hilar or axillary lymphadenopathy. LUNGS AND PLEURA: Irregularly marginated 1.3 cm pleural-based left apical nodule. Atelectasis or dense alveolar consolidation of the right lower lobe. Small right-sided pleural effusion. No pneumothorax. SOFT TISSUES/BONES: Thoracic degenerative disc disease. No acute abnormality of the soft tissues. UPPER ABDOMEN: Limited images of the upper abdomen demonstrate ascites. IMPRESSION: 1. Atelectasis or dense alveolar consolidation of the right lower lobe 2. Irregularly marginated  1.3 cm (13 mm) pleural-based left apical pulmonary nodule; per Fleischner Society Guidelines for a single solid nodule 8.120 mm, recommend further evaluation with non-contrast chest CT at 3 months, PET/CT, or tissue sampling depending on clinical risk and morphology 3. Small right-sided pleural effusion 4. Small pericardial effusion Electronically signed by: Fonda Field MD 07/28/2024 06:27 PM EST RP Workstation: [id number]   DG Chest 1 View Result Date: 07/28/2024 CLINICAL DATA:  Shortness of breath. Abdominal aortic aneurysm rupture and repair. EXAM: CHEST  1 VIEW COMPARISON:  07/27/2024. FINDINGS: The heart size and mediastinal contours are stable. There is atherosclerotic calcification aorta. Lung volumes  are low. Hazy opacity is noted at the right lung base. There is mild atelectasis at the left lung base. No pneumothorax is seen. No acute osseous abnormality. IMPRESSION: 1. Hazy opacity at the right lung base, possible atelectasis, infiltrate, and or pleural effusion. 2. Mild atelectasis at the left lung base. Electronically Signed   By: Leita Birmingham M.D.   On: 07/28/2024 14:52   DG Chest Port 1 View Result Date: 07/27/2024 CLINICAL DATA:  Respiratory failure with hypoxia. EXAM: PORTABLE CHEST 1 VIEW COMPARISON:  07/26/2024 FINDINGS: Lungs are hypoinflated with mild hazy bibasilar opacification likely due to atelectasis and possible small amount of bilateral pleural fluid. Infection in the lung bases is possible. Borderline stable cardiomegaly. Remainder of the exam is unchanged. IMPRESSION: Hypoinflation with mild hazy bibasilar opacification likely due to atelectasis and possible small amount of bilateral pleural fluid. Infection in the lung bases is possible. Electronically Signed   By: Toribio Agreste M.D.   On: 07/27/2024 11:15   DG Chest Port 1 View Result Date: 07/26/2024 CLINICAL DATA:  Respiratory with hypoxia. EXAM: PORTABLE CHEST 1 VIEW COMPARISON:  Radiographs 07/25/2024.  Abdominal CT 07/25/2024. FINDINGS: 0813 hours. Tip of the endotracheal tube is 5.7 cm above the carina. Enteric tube has been advanced with the side hole below the gastroesophageal junction. Right IJ central venous catheter projects to the inferior right atrium. Persistent low lung volumes with patchy opacities at both lung bases, likely atelectasis. Stable mild blunting of the left costophrenic angle without significant pleural effusion on recent CT. Stable cardiomegaly and mediastinal contours. IMPRESSION: 1. Interval advancement of enteric tube with side hole below the gastroesophageal junction. 2. No other significant changes. Persistent low lung volumes with bibasilar atelectasis. Electronically Signed   By: Elsie Perone M.D.   On: 07/26/2024 12:05   PERIPHERAL VASCULAR CATHETERIZATION Result Date: 07/25/2024 See surgical note for result.  DG Abd 1 View Result Date: 07/25/2024 CLINICAL DATA:  OG placement EXAM: ABDOMEN - 1 VIEW COMPARISON:  CT 07/25/2024 FINDINGS: Right-sided central venous catheter tip at the low right atrium. Enteric tube tip folded back upon itself in the region of the stomach. Nonobstructed gas pattern. Aortic stent graft is noted. IMPRESSION: Enteric tube tip folded back upon itself in the region of the stomach. Electronically Signed   By: Luke Bun M.D.   On: 07/25/2024 16:30   DG Chest Port 1 View Result Date: 07/25/2024 CLINICAL DATA:  Intubated central line EXAM: PORTABLE CHEST 1 VIEW COMPARISON:  None Available. FINDINGS: Endotracheal tube tip is about 4.5 cm superior to the carina. Enteric tube tip at the level of distal esophagus, side-port in the region of mid esophagus. Right IJ central venous catheter tip at the low right atrium. Hypoventilatory changes. Mild cardiomegaly. Possible small left effusion. Suspect patchy atelectasis at the left base. IMPRESSION: 1. Endotracheal tube tip about 4.5 cm superior  to the carina. 2. Enteric tube tip at the level of distal esophagus, side-port in the region of mid esophagus. Recommend advancement. See separately dictated abdominal radiograph 3. Right IJ central venous catheter tip at the low right atrium. 4. Hypoventilatory changes with possible small left effusion and patchy atelectasis at the left base. Electronically Signed   By: Luke Bun M.D.   On: 07/25/2024 16:29   CT ABDOMEN PELVIS W CONTRAST Result Date: 07/25/2024 CLINICAL DATA:  Acute right flank pain with associated vomiting. History of abdominal aortic aneurysm measuring approximately 4.5 x 4.9 cm in dimensions by CTA in 2021. EXAM: CT ABDOMEN AND PELVIS WITH CONTRAST TECHNIQUE: Multidetector CT imaging of the abdomen and pelvis was performed using the standard protocol  following bolus administration of intravenous contrast. RADIATION DOSE REDUCTION: This exam was performed according to the departmental dose-optimization program which includes automated exposure control, adjustment of the mA and/or kV according to patient size and/or use of iterative reconstruction technique. CONTRAST:  OMNIPAQUE  IOHEXOL  300 MG/ML  SOLN COMPARISON:  CTA abdomen pelvis 08/10/2020 FINDINGS: Lower chest: No acute abnormality. Hepatobiliary: No focal liver abnormality is seen. No gallstones, gallbladder wall thickening, or biliary dilatation. Pancreas: Unremarkable. No pancreatic ductal dilatation or surrounding inflammatory changes. Spleen: Normal in size without focal abnormality. Adrenals/Urinary Tract: Adrenal glands are unremarkable. Kidneys are normal, without renal calculi, focal lesion, or hydronephrosis. Bladder is unremarkable. Stomach/Bowel: Bowel shows no evidence of obstruction, ileus, inflammation or lesion. The appendix is normal. No free intraperitoneal air. Vascular/Lymphatic: No lymphadenopathy. Significant interval enlargement infrarenal abdominal aortic aneurysm now measuring approximately 5.7 x 6.9 cm in maximum transverse dimensions with maximum oblique diameter of 7.0 cm. There is evidence to suggest acute focal leak from the right anterior aspect of the aneurysm with retroperitoneal stranding anterior to the aneurysm and to the right of the aneurysm also abutting the posterior duodenum and anterior aspect of the IVC. No dissection. Mural thrombus present in the aneurysm sac. Interval enlargement proximal right common iliac artery aneurysm now measuring up to 3.5 cm. Reproductive: Status post hysterectomy. No adnexal masses. Other: Umbilical hernia containing fat. Musculoskeletal: No acute or significant osseous findings. IMPRESSION: 1. Interval enlargement of infrarenal abdominal aortic aneurysm now measuring up to 7 cm in greatest diameter. Evidence of acute focal  retroperitoneal stranding anterior and to the right of the aneurysm consistent with focal hemorrhage/leak from the aneurysm. 2. Interval enlargement of right common iliac artery aneurysm now measuring up to 3.5 cm. 3. These results were called to the ordering clinician emergently by the CT Department and the patient will be transported to the Emergency Department. Electronically Signed   By: Marcey Moan M.D.   On: 07/25/2024 12:06    Microbiology: Results for orders placed or performed during the hospital encounter of 07/25/24  MRSA Next Gen by PCR, Nasal     Status: None   Collection Time: 07/25/24  3:33 PM   Specimen: Nasal Mucosa; Nasal Swab  Result Value Ref Range Status   MRSA by PCR Next Gen NOT DETECTED NOT DETECTED Final    Comment: (NOTE) The GeneXpert MRSA Assay (FDA approved for NASAL specimens only), is one component of a comprehensive MRSA colonization surveillance program. It is not intended to diagnose MRSA infection nor to guide or monitor treatment for MRSA infections. Test performance is not FDA approved in patients less than 51 years old. Performed at [facility], [address], [place], [place] [address]   MRSA Next Gen by PCR, Nasal  Status: None   Collection Time: 07/25/24  3:57 PM   Specimen: Nasal Mucosa; Nasal Swab  Result Value Ref Range Status   MRSA by PCR Next Gen NOT DETECTED NOT DETECTED Final    Comment: (NOTE) The GeneXpert MRSA Assay (FDA approved for NASAL specimens only), is one component of a comprehensive MRSA colonization surveillance program. It is not intended to diagnose MRSA infection nor to guide or monitor treatment for MRSA infections. Test performance is not FDA approved in patients less than 88 years old. Performed at [facility], [address] [place], [place] [address]   Culture, blood (Routine X 2) w Reflex to ID Panel     Status: None   Collection Time: 07/28/24  7:39 PM   Specimen: BLOOD  Result  Value Ref Range Status   Specimen Description BLOOD BLOOD LEFT HAND  Final   Special Requests   Final    BOTTLES DRAWN AEROBIC AND ANAEROBIC Blood Culture adequate volume   Culture   Final    NO GROWTH 5 DAYS Performed at [facility], [address] [place], [place] [address]    Report Status 08/02/2024 FINAL  Final  Culture, blood (Routine X 2) w Reflex to ID Panel     Status: None   Collection Time: 07/28/24  7:46 PM   Specimen: BLOOD  Result Value Ref Range Status   Specimen Description BLOOD BLOOD RIGHT HAND  Final   Special Requests   Final    BOTTLES DRAWN AEROBIC AND ANAEROBIC Blood Culture results may not be optimal due to an inadequate volume of blood received in culture bottles   Culture   Final    NO GROWTH 5 DAYS Performed at [facility], [address] [place], [place] [address]    Report Status 08/02/2024 FINAL  Final    Labs: CBC: Recent Labs  Lab 07/31/24 0421 08/01/24 0500 08/02/24 0634 08/03/24 0539 08/04/24 0412  WBC 11.8* 12.0* 11.7* 12.6* 13.4*  HGB 7.9* 8.3* 9.3* 9.7* 10.1*  HCT 24.5* 26.3* 30.2* 31.3* 32.0*  MCV 88.8 88.9 89.6 89.2 88.6  PLT 150 179 199 221 243   Basic Metabolic Panel: Recent Labs  Lab 07/31/24 0421 07/31/24 2245 08/01/24 0500 08/02/24 0634 08/03/24 0539 08/04/24 0412  NA 140  --  142 143 144 143  K 3.3* 3.9 3.7 3.7 3.7 3.2*  CL 104  --  103 106 107 102  CO2 28  --  30 29 31  32  GLUCOSE 120*  --  116* 115* 116* 120*  BUN 30*  --  28* 28* 29* 26*  CREATININE 1.31*  --  1.28* 1.17* 1.22* 1.26*  CALCIUM  8.5*  --  8.8* 9.1 9.0 8.9  MG 2.1  --  2.0  --   --   --   PHOS 2.5  --  2.1* 3.3 2.8  --    Liver Function Tests: No results for input(s): AST, ALT, ALKPHOS, BILITOT, PROT, ALBUMIN  in the last 168 hours. CBG: Recent Labs  Lab 08/05/24 0822 08/05/24 1214 08/05/24 1639 08/05/24 2146 08/06/24 0851  GLUCAP 136* 156* 137* 122* 136*    Discharge time spent: greater than 30  minutes.  Signed: Cresencio Fairly, MD [facility] 08/06/2024

## 2024-08-06 NOTE — Progress Notes (Signed)
 Progress Note    08/06/2024 8:03 AM 8 Days Post-Op  Subjective:  Rose Perry is a 78 yo female who is now POD #7 and #4 from:   PROCEDURE: 07/25/2024 US  guidance for vascular access, bilateral femoral arteries Catheter placement into aorta from bilateral femoral approaches Catheter placement into the right internal iliac artery from the left common femoral approach third order catheter placement Coil embolization of the right internal iliac artery for treatment of the right common iliac artery aneurysm. Placement of a 28 x 14 x 12 C3 conformable Gore Excluder Endoprosthesis main body with a 16 x 10 left iliac extension limb with a 12 x 14 contralateral limb that extends into the right external iliac artery for treatment of the common iliac artery aneurysm Placement of a 28 mm proximal aortic extension cuff Selective catheterization of the left renal artery first-order catheter placement. Placement of a 7 mm x 26 mm Lifestream stent left renal artery ProGlide closure devices bilateral femoral arteries   PRE-OPERATIVE DIAGNOSIS: Ruptured juxtarenal AAA with hemodynamic shock   POST-OPERATIVE DIAGNOSIS: same   SURGEON: Cordella Shawl, MD and Selinda Gu, MD - Co-surgeons   ANESTHESIA: general   POD #7   Date of Surgery: 07/29/2024,7:54 PM   Surgeon:Schnier, Cordella [name]    Pre-operative Diagnosis: Symptomatic pulmonary emboli with right heart strain and hypoxia   Post-operative diagnosis:  Same   Procedure(s) Performed:             1.  Contrast injection right heart and bilateral pulmonary arteries             2.  Placement of a right IJ triple-lumen catheter with ultrasound guidance             3.  Mechanical thrombectomy bilateral lobar pulmonary arteries for removal of pulmonary emboli using the Penumbra CAT 8 thrombectomy catheter.             4.  Selective catheter placement right upper lobe pulmonary artery, middle lobe pulmonary artery and lower lobe pulmonary artery              5.  Selective catheter placement left upper lobe pulmonary artery and lower lobe pulmonary artery.             6.  Placement of a Denali infrarenal IVC filter   Patient states she is feeling better. No longer needing supplemental oxygen but her respiratory status is improved subjectively. No major events overnight. Patient recovering as expected. Awaiting authorization for placement per patient.    Vitals:   08/06/24 0006 08/06/24 0512  BP: (!) 156/67 129/66  Pulse: 66 65  Resp:  18  Temp: 97.8 F (36.6 C) 97.7 F (36.5 C)  SpO2: 99% 99%   Physical Exam: Cardiac: RRR with PVC's, normal S1 and S2.  No murmurs appreciated. Lungs: Nonlabored breathing on Nasal Canula Oxygen 2 liters.  No rales rhonchi or wheezing noted. Diminished lung sounds in the right lower and middle lobe.  Incisions: Bilateral groin puncture sites.  Dressings clean dry and intact and without any hematoma seroma to note. Extremities: All extremities are warm to touch.  Positive palpable pulses in bilateral lower extremities. Abdomen: Positive bowel sounds throughout, patient's mid abdomen remains tender upon palpation but this is expected.  Nondistended this morning. Neurologic: Alert and oriented X 3. Answers all questions and follows commands.  CBC    Component Value Date/Time   WBC 13.4 (H) 08/04/2024 0412   RBC 3.61 (L) 08/04/2024 9587  HGB 10.1 (L) 08/04/2024 0412   HGB 11.9 (L) 08/22/2013 0455   HCT 32.0 (L) 08/04/2024 0412   HCT 43.8 08/07/2013 0925   PLT 243 08/04/2024 0412   PLT 217 08/22/2013 0455   MCV 88.6 08/04/2024 0412   MCV 78 (L) 08/07/2013 0925   MCH 28.0 08/04/2024 0412   MCHC 31.6 08/04/2024 0412   RDW 16.7 (H) 08/04/2024 0412   RDW 14.4 08/07/2013 0925   LYMPHSABS 2.0 08/13/2020 0527   MONOABS 0.5 08/13/2020 0527   EOSABS 0.4 08/13/2020 0527   BASOSABS 0.0 08/13/2020 0527    BMET    Component Value Date/Time   NA 143 08/04/2024 0412   NA 137 08/23/2013 0519   K 3.2  (L) 08/04/2024 0412   K 3.4 (L) 08/23/2013 0519   CL 102 08/04/2024 0412   CL 103 08/23/2013 0519   CO2 32 08/04/2024 0412   CO2 30 08/23/2013 0519   GLUCOSE 120 (H) 08/04/2024 0412   GLUCOSE 168 (H) 08/23/2013 0519   BUN 26 (H) 08/04/2024 0412   BUN 12 08/23/2013 0519   CREATININE 1.26 (H) 08/04/2024 0412   CREATININE 1.17 08/23/2013 0519   CALCIUM  8.9 08/04/2024 0412   CALCIUM  8.2 (L) 08/23/2013 0519   GFRNONAA 43 (L) 08/04/2024 0412   GFRNONAA 48 (L) 08/23/2013 0519   GFRAA 55 (L) 08/23/2013 0519    INR    Component Value Date/Time   INR 1.1 07/29/2024 1741   INR 1.0 08/07/2013 0925     Intake/Output Summary (Last 24 hours) at 08/06/2024 0803 Last data filed at 08/05/2024 1430 Gross per 24 hour  Intake 240 ml  Output 10 ml  Net 230 ml     Assessment/Plan:  78 y.o. female is s/p SEE ABOVE 8 Days Post-Op   PLAN OOB to chair, ambulate as much as tolerated. Eliquis  PO  Okay to discharge per vascular surgery when medically stable.  Patient to follow up with [facility] in 3-4 weeks with EVAR ultrasounds.   DVT prophylaxis:  Eliquis  10 mg BID   [name] [facility] 08/06/2024 8:03 AM

## 2024-08-06 NOTE — Plan of Care (Signed)
  Problem: Education: Goal: Knowledge of General Education information will improve Description: Including pain rating scale, medication(s)/side effects and non-pharmacologic comfort measures Outcome: Progressing   Problem: Health Behavior/Discharge Planning: Goal: Ability to manage health-related needs will improve Outcome: Progressing   Problem: Clinical Measurements: Goal: Ability to maintain clinical measurements within normal limits will improve Outcome: Progressing Goal: Will remain free from infection Outcome: Progressing Goal: Diagnostic test results will improve Outcome: Progressing Goal: Respiratory complications will improve Outcome: Progressing Goal: Cardiovascular complication will be avoided Outcome: Progressing   Problem: Activity: Goal: Risk for activity intolerance will decrease Outcome: Progressing   Problem: Nutrition: Goal: Adequate nutrition will be maintained Outcome: Progressing   Problem: Coping: Goal: Level of anxiety will decrease Outcome: Progressing   Problem: Elimination: Goal: Will not experience complications related to bowel motility Outcome: Progressing Goal: Will not experience complications related to urinary retention Outcome: Progressing   Problem: Pain Managment: Goal: General experience of comfort will improve and/or be controlled Outcome: Progressing   Problem: Safety: Goal: Ability to remain free from injury will improve Outcome: Progressing   Problem: Skin Integrity: Goal: Risk for impaired skin integrity will decrease Outcome: Progressing   Problem: Education: Goal: Ability to describe self-care measures that may prevent or decrease complications (Diabetes Survival Skills Education) will improve Outcome: Progressing Goal: Individualized Educational Video(s) Outcome: Progressing   Problem: Coping: Goal: Ability to adjust to condition or change in health will improve Outcome: Progressing   Problem: Fluid  Volume: Goal: Ability to maintain a balanced intake and output will improve Outcome: Progressing   Problem: Health Behavior/Discharge Planning: Goal: Ability to identify and utilize available resources and services will improve Outcome: Progressing Goal: Ability to manage health-related needs will improve Outcome: Progressing   Problem: Metabolic: Goal: Ability to maintain appropriate glucose levels will improve Outcome: Progressing   Problem: Nutritional: Goal: Maintenance of adequate nutrition will improve Outcome: Progressing Goal: Progress toward achieving an optimal weight will improve Outcome: Progressing   Problem: Skin Integrity: Goal: Risk for impaired skin integrity will decrease Outcome: Progressing   Problem: Tissue Perfusion: Goal: Adequacy of tissue perfusion will improve Outcome: Progressing   Problem: Education: Goal: Knowledge of discharge needs will improve Outcome: Progressing   Problem: Clinical Measurements: Goal: Postoperative complications will be avoided or minimized Outcome: Progressing   Problem: Respiratory: Goal: Will achieve and/or maintain a regular respiratory rate, without signs or symptoms of dyspnea Outcome: Progressing   Problem: Skin Integrity: Goal: Demonstration of wound healing without infection will improve Outcome: Progressing

## 2024-08-07 DIAGNOSIS — E785 Hyperlipidemia, unspecified: Secondary | ICD-10-CM | POA: Diagnosis not present

## 2024-08-08 DIAGNOSIS — I7111 Aneurysm of the ascending aorta, ruptured: Secondary | ICD-10-CM | POA: Diagnosis not present

## 2024-08-08 DIAGNOSIS — I4891 Unspecified atrial fibrillation: Secondary | ICD-10-CM | POA: Diagnosis not present

## 2024-08-08 DIAGNOSIS — D649 Anemia, unspecified: Secondary | ICD-10-CM | POA: Diagnosis not present

## 2024-08-08 DIAGNOSIS — I2602 Saddle embolus of pulmonary artery with acute cor pulmonale: Secondary | ICD-10-CM | POA: Diagnosis not present

## 2024-08-09 DIAGNOSIS — I2602 Saddle embolus of pulmonary artery with acute cor pulmonale: Secondary | ICD-10-CM | POA: Diagnosis not present

## 2024-08-09 DIAGNOSIS — I7111 Aneurysm of the ascending aorta, ruptured: Secondary | ICD-10-CM | POA: Diagnosis not present

## 2024-08-09 DIAGNOSIS — Z5329 Procedure and treatment not carried out because of patient's decision for other reasons: Secondary | ICD-10-CM | POA: Diagnosis not present

## 2024-08-09 DIAGNOSIS — I4891 Unspecified atrial fibrillation: Secondary | ICD-10-CM | POA: Diagnosis not present

## 2024-08-14 ENCOUNTER — Encounter

## 2024-08-23 ENCOUNTER — Ambulatory Visit (INDEPENDENT_AMBULATORY_CARE_PROVIDER_SITE_OTHER): Admitting: Nurse Practitioner

## 2024-08-23 ENCOUNTER — Encounter (INDEPENDENT_AMBULATORY_CARE_PROVIDER_SITE_OTHER): Payer: Self-pay | Admitting: Nurse Practitioner

## 2024-08-23 VITALS — BP 115/72 | HR 78 | Resp 18

## 2024-08-23 DIAGNOSIS — Z9889 Other specified postprocedural states: Secondary | ICD-10-CM

## 2024-08-23 DIAGNOSIS — I739 Peripheral vascular disease, unspecified: Secondary | ICD-10-CM

## 2024-08-23 DIAGNOSIS — I2602 Saddle embolus of pulmonary artery with acute cor pulmonale: Secondary | ICD-10-CM

## 2024-08-23 DIAGNOSIS — I82401 Acute embolism and thrombosis of unspecified deep veins of right lower extremity: Secondary | ICD-10-CM

## 2024-08-23 DIAGNOSIS — I7133 Infrarenal abdominal aortic aneurysm, ruptured: Secondary | ICD-10-CM

## 2024-08-23 MED ORDER — APIXABAN 5 MG PO TABS: 5.0000 mg | ORAL_TABLET | Freq: Two times a day (BID) | ORAL | 6 refills | Status: AC

## 2024-09-01 ENCOUNTER — Encounter (INDEPENDENT_AMBULATORY_CARE_PROVIDER_SITE_OTHER): Payer: Self-pay | Admitting: Nurse Practitioner

## 2024-09-01 NOTE — Progress Notes (Signed)
 "  Subjective:    Patient ID: Rose Perry, female    DOB: 05-Mar-1946, 78 y.o.   MRN: [id number] Chief Complaint  Patient presents with   Follow-up    Ref [facility] consult CTA done 07/29/24 rupture infrarenal AAA    HPI  Discussed the use of AI scribe software for clinical note transcription with the patient, who gave verbal consent to proceed.  History of Present Illness Rose Perry is a 78 year old female with a history of ruptured aneurysm and pulmonary embolism who presents for follow-up of her vascular conditions. She is accompanied by a caregiver who assists with her medication management.  She experienced a ruptured aneurysm, which was unexpected as prior blood flow checks did not reveal it. She was hospitalized promptly and underwent treatment, including the placement of a stent graft. She is currently at home and receiving therapy to aid her recovery.  She has a history of a blood clot in her lungs and a blood clot in her right leg, discovered during her hospital stay. She is currently on Eliquis  to manage these conditions. She experiences swelling in her right leg, which she attributes to sleeping in a chair. She now sleeps in a chair at home with her feet elevated on a stool.  She is able to walk from her bedroom to the kitchen and to the car with the aid of a walker. She has a support system at home and prefers being there over staying at a facility. Her current diet includes a cup of coffee, a banana, and a chicken biscuit, and she notes a decrease in her usual food intake. She weighs 200 pounds.  No issues with breathing and she is able to walk with a walker. She experiences swelling in her right leg.    Results RADIOLOGY Lower Extremity Ultrasound: Deep vein thrombosis in the right leg (07/30/2024)   Review of Systems  Neurological:  Positive for weakness.  All other systems reviewed and are negative.      Objective:   Physical Exam Vitals reviewed.   HENT:     Head: Normocephalic.  Cardiovascular:     Rate and Rhythm: Normal rate.  Pulmonary:     Effort: Pulmonary effort is normal.  Skin:    General: Skin is warm and dry.  Neurological:     Mental Status: She is alert and oriented to person, place, and time.  Psychiatric:        Mood and Affect: Mood normal.        Behavior: Behavior normal.        Thought Content: Thought content normal.        Judgment: Judgment normal.     Physical Exam MEASUREMENTS: Weight- 200.  BP 115/72 (BP Location: Left Arm)   Pulse 78   Resp 18   Past Medical History:  Diagnosis Date   Celiac artery stenosis    CME (cystoid macular edema)    right   CNVM (choroidal neovascular membrane)    Diabetes mellitus without complication (HCC)    Diverticulosis of colon    Elevated lipids    History of cataract    History of rectal bleeding    Hx of adenomatous colonic polyps    Hyperlipidemia    Hypertension    Hypertensive retinopathy of both eyes    Lymphedema    Primary open angle glaucoma (POAG) of both eyes     Social History   Socioeconomic History   Marital status: Divorced  Spouse name: Not on file   Number of children: Not on file   Years of education: Not on file   Highest education level: Not on file  Occupational History   Not on file  Tobacco Use   Smoking status: Former   Smokeless tobacco: Never  Vaping Use   Vaping status: Never Used  Substance and Sexual Activity   Alcohol use: No   Drug use: No   Sexual activity: Not Currently  Other Topics Concern   Not on file  Social History Narrative   There are several medical conditions patient states she has never had. This RN was deleting some when the patient asked me to stop so she could have list. The ones I deleted are AAA, arthritis, and anemia.   Social Drivers of Health   Tobacco Use: Medium Risk (08/23/2024)   Patient History    Smoking Tobacco Use: Former    Smokeless Tobacco Use: Never    Passive  Exposure: Not on file  Financial Resource Strain: Low Risk  (01/18/2023)   Received from [facility]   Overall Financial Resource Strain (CARDIA)    Difficulty of Paying Living Expenses: Not hard at all  Food Insecurity: Patient Unable To Answer (07/25/2024)   Epic    Worried About Programme Researcher, Broadcasting/film/video in the Last Year: Patient unable to answer    Ran Out of Food in the Last Year: Patient unable to answer  Transportation Needs: Patient Unable To Answer (07/25/2024)   Epic    Lack of Transportation (Medical): Patient unable to answer    Lack of Transportation (Non-Medical): Patient unable to answer  Physical Activity: Not on file  Stress: Not on file  Social Connections: Patient Unable To Answer (07/25/2024)   Social Connection and Isolation Panel    Frequency of Communication with Friends and Family: Patient unable to answer    Frequency of Social Gatherings with Friends and Family: Patient unable to answer    Attends Religious Services: Patient unable to answer    Active Member of Clubs or Organizations: Patient unable to answer    Attends Banker Meetings: Patient unable to answer    Marital Status: Patient unable to answer  Intimate Partner Violence: Patient Unable To Answer (07/25/2024)   Epic    Fear of Current or Ex-Partner: Patient unable to answer    Emotionally Abused: Patient unable to answer    Physically Abused: Patient unable to answer    Sexually Abused: Patient unable to answer  Depression (PHQ2-9): Not on file  Alcohol Screen: Not on file  Housing: Patient Unable To Answer (07/25/2024)   Epic    Unable to Pay for Housing in the Last Year: Patient unable to answer    Number of Times Moved in the Last Year: Not on file    Homeless in the Last Year: Patient unable to answer  Utilities: Patient Unable To Answer (07/25/2024)   Epic    Threatened with loss of utilities: Patient unable to answer  Health Literacy: Not on file    Past  Surgical History:  Procedure Laterality Date   ABDOMINAL HYSTERECTOMY     CENTRAL LINE INSERTION  07/29/2024   Procedure: CENTRAL LINE INSERTION;  Surgeon: Jama Cordella [name], MD;  Location: [facility] INVASIVE CV LAB;  Service: Cardiovascular;;   COLONOSCOPY     COLONOSCOPY WITH PROPOFOL  N/A 08/22/2016   Procedure: COLONOSCOPY WITH PROPOFOL ;  Surgeon: Lamar ONEIDA Holmes, MD;  Location: [facility] ENDOSCOPY;  Service:  Endoscopy;  Laterality: N/A;   COLONOSCOPY WITH PROPOFOL  N/A 08/11/2020   Procedure: COLONOSCOPY WITH PROPOFOL ;  Surgeon: Maryruth Ole DASEN, MD;  Location: [facility] ENDOSCOPY;  Service: Endoscopy;  Laterality: N/A;   COLONOSCOPY WITH PROPOFOL  N/A 11/12/2021   Procedure: COLONOSCOPY WITH PROPOFOL ;  Surgeon: Maryruth Ole DASEN, MD;  Location: [facility] ENDOSCOPY;  Service: Endoscopy;  Laterality: N/A;  DM   COLONOSCOPY WITH PROPOFOL  N/A 08/18/2023   Procedure: COLONOSCOPY WITH PROPOFOL ;  Surgeon: Maryruth Ole DASEN, MD;  Location: [facility] ENDOSCOPY;  Service: Endoscopy;  Laterality: N/A;   ENDOVASCULAR STENT GRAFT (AAA) N/A 07/25/2024   Procedure: ENDOVASCULAR STENT GRAFT (AAA);  Surgeon: Jama Cordella [name], MD;  Location: [facility] INVASIVE CV LAB;  Service: Cardiovascular;  Laterality: N/A;  ruptured AAA   ENDOVASCULAR STENT GRAFT REPAIR  07/25/2024   Procedure: ENDOVASCULAR STENT GRAFT REPAIR;  Surgeon: Jama Cordella [name], MD;  Location: [facility] INVASIVE CV LAB;  Service: Cardiovascular;;   ESOPHAGOGASTRODUODENOSCOPY (EGD) WITH PROPOFOL  N/A 08/11/2020   Procedure: ESOPHAGOGASTRODUODENOSCOPY (EGD) WITH PROPOFOL ;  Surgeon: Maryruth Ole DASEN, MD;  Location: [facility] ENDOSCOPY;  Service: Endoscopy;  Laterality: N/A;   EYE SURGERY     IVC FILTER INSERTION N/A 07/29/2024   Procedure: IVC FILTER INSERTION;  Surgeon: Jama Cordella [name], MD;  Location: [facility] INVASIVE CV LAB;  Service: Cardiovascular;  Laterality: N/A;   JOINT REPLACEMENT Left    left total knee replacement   LOWER EXTREMITY ANGIOGRAPHY Right 02/20/2024    Procedure: Lower Extremity Angiography;  Surgeon: Jama Cordella [name], MD;  Location: [facility] INVASIVE CV LAB;  Service: Cardiovascular;  Laterality: Right;   ltkr     needle in foot removed     POLYPECTOMY  08/18/2023   Procedure: POLYPECTOMY;  Surgeon: Maryruth Ole DASEN, MD;  Location: [facility] ENDOSCOPY;  Service: Endoscopy;;   PULMONARY THROMBECTOMY Bilateral 07/29/2024   Procedure: PULMONARY THROMBECTOMY;  Surgeon: Jama Cordella [name], MD;  Location: [facility] INVASIVE CV LAB;  Service: Cardiovascular;  Laterality: Bilateral;   REFRACTIVE SURGERY      Family History  Problem Relation Age of Onset   Hypertension Mother    Hyperlipidemia Mother    Hypertension Father    Coronary artery disease Brother    Breast cancer Neg Hx     Allergies[1]     Latest Ref Rng & Units 08/04/2024    4:12 AM 08/03/2024    5:39 AM 08/02/2024    6:34 AM  CBC  WBC 4.0 - 10.5 K/uL 13.4  12.6  11.7   Hemoglobin 12.0 - 15.0 g/dL 89.8  9.7  9.3   Hematocrit 36.0 - 46.0 % 32.0  31.3  30.2   Platelets 150 - 400 K/uL 243  221  199       CMP     Component Value Date/Time   NA 143 08/04/2024 0412   NA 137 08/23/2013 0519   K 3.2 (L) 08/04/2024 0412   K 3.4 (L) 08/23/2013 0519   CL 102 08/04/2024 0412   CL 103 08/23/2013 0519   CO2 32 08/04/2024 0412   CO2 30 08/23/2013 0519   GLUCOSE 120 (H) 08/04/2024 0412   GLUCOSE 168 (H) 08/23/2013 0519   BUN 26 (H) 08/04/2024 0412   BUN 12 08/23/2013 0519   CREATININE 1.26 (H) 08/04/2024 0412   CREATININE 1.17 08/23/2013 0519   CALCIUM  8.9 08/04/2024 0412   CALCIUM  8.2 (L) 08/23/2013 0519   PROT 5.4 (L) 07/29/2024 2019   ALBUMIN  3.1 (L) 07/29/2024 2019   AST 99 (H) 07/29/2024 2019  ALT 27 07/29/2024 2019   ALKPHOS 60 07/29/2024 2019   BILITOT 0.6 07/29/2024 2019   GFRNONAA 43 (L) 08/04/2024 0412   GFRNONAA 48 (L) 08/23/2013 0519     No results found.     Assessment & Plan:   1. Ruptured infrarenal abdominal aortic aneurysm (AAA) (HCC)  (Primary) Ruptured abdominal aortic aneurysm, post stent graft repair Post-operative status following stent graft repair. Stent graft requires surveillance for complications such as cracking or leaking. - Schedule ultrasound to assess stent graft function. - Instructed to ensure no food or drink prior to ultrasound to avoid gas bubbles.  2. Acute deep vein thrombosis (DVT) of right lower extremity, unspecified vein (HCC) Deep vein thrombosis, right lower extremity Deep vein thrombosis in the right lower extremity identified during hospitalization. Continued monitoring necessary for clot resolution. - Schedule ultrasound to assess resolution of deep vein thrombosis in the right leg.  3. Acute saddle pulmonary embolism with acute cor pulmonale (HCC) Pulmonary embolism Identified during hospitalization. On Eliquis  for anticoagulation therapy. No new symptoms reported. - Continue Eliquis  for anticoagulation. - Ensured refill of Eliquis  to prevent lapse in therapy.  4. Peripheral arterial disease with history of revascularization Peripheral arterial disease with history of revascularization Peripheral arterial disease with previous revascularization. No new symptoms reported. - Continue current management and monitoring.    Medications Ordered Prior to Encounter[2]  There are no Patient Instructions on file for this visit. No follow-ups on file.   Mir Fullilove E Almando Brawley, NP      [1]  Allergies Allergen Reactions   Codeine   [2]  Current Outpatient Medications on File Prior to Visit  Medication Sig Dispense Refill   amiodarone  (PACERONE ) 200 MG tablet Take 2 tablets (400 mg total) by mouth 2 (two) times daily for 2 days, THEN 1 tablet (200 mg total) daily.     amLODipine (NORVASC) 5 MG tablet Take 1 tablet by mouth daily.     apixaban  (ELIQUIS ) 5 MG TABS tablet Take 2 tablets (10 mg total) by mouth 2 (two) times daily for 3 days, THEN 1 tablet (5 mg total) 2 (two) times daily.      atorvastatin (LIPITOR) 20 MG tablet Take 1 tablet by mouth daily.     dorzolamide -timolol  (COSOPT ) 2-0.5 % ophthalmic solution Place 1 drop into both eyes 2 (two) times daily.     metFORMIN (GLUCOPHAGE) 500 MG tablet Take 1 tablet by mouth 2 (two) times daily with a meal.     Multiple Vitamins-Minerals (SENTRY PO) Take by mouth daily.     pantoprazole  (PROTONIX ) 40 MG tablet Take 1 tablet (40 mg total) by mouth daily. 30 tablet 0   polyethylene glycol (MIRALAX ) 17 g packet Take 17 g by mouth 2 (two) times daily. 60 each 0   potassium chloride  (KLOR-CON ) 10 MEQ tablet Take 10 mEq by mouth daily.     No current facility-administered medications on file prior to visit.   "

## 2024-09-11 ENCOUNTER — Other Ambulatory Visit (INDEPENDENT_AMBULATORY_CARE_PROVIDER_SITE_OTHER): Payer: Self-pay | Admitting: Nurse Practitioner

## 2024-09-11 DIAGNOSIS — I714 Abdominal aortic aneurysm, without rupture, unspecified: Secondary | ICD-10-CM

## 2024-09-11 DIAGNOSIS — I82411 Acute embolism and thrombosis of right femoral vein: Secondary | ICD-10-CM

## 2024-09-19 ENCOUNTER — Ambulatory Visit (INDEPENDENT_AMBULATORY_CARE_PROVIDER_SITE_OTHER)

## 2024-09-19 ENCOUNTER — Ambulatory Visit (INDEPENDENT_AMBULATORY_CARE_PROVIDER_SITE_OTHER): Admitting: Nurse Practitioner

## 2024-09-19 ENCOUNTER — Encounter (INDEPENDENT_AMBULATORY_CARE_PROVIDER_SITE_OTHER): Payer: Self-pay | Admitting: Nurse Practitioner

## 2024-09-19 ENCOUNTER — Telehealth (INDEPENDENT_AMBULATORY_CARE_PROVIDER_SITE_OTHER): Payer: Self-pay | Admitting: Nurse Practitioner

## 2024-09-19 VITALS — BP 156/75 | HR 54 | Resp 17 | Ht 67.0 in | Wt 208.0 lb

## 2024-09-19 DIAGNOSIS — I714 Abdominal aortic aneurysm, without rupture, unspecified: Secondary | ICD-10-CM | POA: Diagnosis not present

## 2024-09-19 DIAGNOSIS — I7143 Infrarenal abdominal aortic aneurysm, without rupture: Secondary | ICD-10-CM

## 2024-09-19 DIAGNOSIS — I7133 Infrarenal abdominal aortic aneurysm, ruptured: Secondary | ICD-10-CM

## 2024-09-19 DIAGNOSIS — Z9889 Other specified postprocedural states: Secondary | ICD-10-CM

## 2024-09-19 DIAGNOSIS — I82401 Acute embolism and thrombosis of unspecified deep veins of right lower extremity: Secondary | ICD-10-CM

## 2024-09-19 DIAGNOSIS — I82411 Acute embolism and thrombosis of right femoral vein: Secondary | ICD-10-CM | POA: Diagnosis not present

## 2024-09-19 DIAGNOSIS — I739 Peripheral vascular disease, unspecified: Secondary | ICD-10-CM

## 2024-09-19 NOTE — Telephone Encounter (Signed)
 LVM for pt granddaughter to CB to schedule pt appt at [facility]  Fu 3 months + EVAR + ABI see FB

## 2024-09-29 ENCOUNTER — Encounter (INDEPENDENT_AMBULATORY_CARE_PROVIDER_SITE_OTHER): Payer: Self-pay | Admitting: Nurse Practitioner

## 2024-09-29 NOTE — Progress Notes (Signed)
 "  Subjective:    Patient ID: Rose Perry, female    DOB: 26-May-1946, 79 y.o.   MRN: [id number] Chief Complaint  Patient presents with   Follow-up    pt conv Rt DVT + Evar     HPI  Discussed the use of AI scribe software for clinical note transcription with the patient, who gave verbal consent to proceed.  History of Present Illness Rose Perry is a 79 year old female with prior ruptured infrarenal abdominal aortic aneurysm post-endovascular repair, resolved right lower extremity DVT, and peripheral arterial disease with prior revascularization who presents for vascular surgery follow-up.  She is following up after endovascular stent graft repair of a ruptured infrarenal abdominal aortic aneurysm. She has not experienced severe abdominal pain radiating to the back since her repair.  She previously had an acute deep vein thrombosis in the right lower extremity, which was treated. She is currently taking Eliquis , though she is unsure of the dosing as her granddaughter manages her medications. She has no new leg pain or swelling and is able to ambulate more since her last visit.  She has peripheral arterial disease with prior lower extremity revascularization, including stent placement. She is ambulating and has no new complaints related to her legs.    Results Duplex ultrasonography Performed on 09/19/2024: Right lower extremity--no evidence of deep vein thrombosis; previously noted blood clot resolved. Abdominal aorta--post-endovascular aneurysm repair; aneurysm sac appears slightly larger, consistent with prior rupture; no endoleak detected at repair site.   Review of Systems  Cardiovascular:  Positive for leg swelling.  Neurological:  Positive for weakness.  All other systems reviewed and are negative.      Objective:   Physical Exam Vitals reviewed.  HENT:     Head: Normocephalic.  Cardiovascular:     Rate and Rhythm: Normal rate.  Pulmonary:     Effort: Pulmonary  effort is normal.  Musculoskeletal:     Right lower leg: Edema present.     Left lower leg: Edema present.  Skin:    General: Skin is warm and dry.  Neurological:     Mental Status: She is alert and oriented to person, place, and time.     Gait: Gait abnormal.  Psychiatric:        Mood and Affect: Mood normal.        Behavior: Behavior normal.        Thought Content: Thought content normal.        Judgment: Judgment normal.     Physical Exam    BP (!) 156/75   Pulse (!) 54   Resp 17   Ht 5' 7 (1.702 m)   Wt 208 lb (94.3 kg)   BMI 32.58 kg/m   Past Medical History:  Diagnosis Date   Celiac artery stenosis    CME (cystoid macular edema)    right   CNVM (choroidal neovascular membrane)    Diabetes mellitus without complication (HCC)    Diverticulosis of colon    Elevated lipids    History of cataract    History of rectal bleeding    Hx of adenomatous colonic polyps    Hyperlipidemia    Hypertension    Hypertensive retinopathy of both eyes    Lymphedema    Primary open angle glaucoma (POAG) of both eyes     Social History   Socioeconomic History   Marital status: Divorced    Spouse name: Not on file   Number of children: Not  on file   Years of education: Not on file   Highest education level: Not on file  Occupational History   Not on file  Tobacco Use   Smoking status: Former   Smokeless tobacco: Never  Vaping Use   Vaping status: Never Used  Substance and Sexual Activity   Alcohol use: No   Drug use: No   Sexual activity: Not Currently  Other Topics Concern   Not on file  Social History Narrative   There are several medical conditions patient states she has never had. This RN was deleting some when the patient asked me to stop so she could have list. The ones I deleted are AAA, arthritis, and anemia.   Social Drivers of Health   Tobacco Use: Medium Risk (09/19/2024)   Patient History    Smoking Tobacco Use: Former    Smokeless Tobacco Use:  Never    Passive Exposure: Not on file  Financial Resource Strain: Low Risk  (01/18/2023)   Received from [facility]   Overall Financial Resource Strain (CARDIA)    Difficulty of Paying Living Expenses: Not hard at all  Food Insecurity: Patient Unable To Answer (07/25/2024)   Epic    Worried About Programme Researcher, Broadcasting/film/video in the Last Year: Patient unable to answer    Ran Out of Food in the Last Year: Patient unable to answer  Transportation Needs: Patient Unable To Answer (07/25/2024)   Epic    Lack of Transportation (Medical): Patient unable to answer    Lack of Transportation (Non-Medical): Patient unable to answer  Physical Activity: Not on file  Stress: Not on file  Social Connections: Patient Unable To Answer (07/25/2024)   Social Connection and Isolation Panel    Frequency of Communication with Friends and Family: Patient unable to answer    Frequency of Social Gatherings with Friends and Family: Patient unable to answer    Attends Religious Services: Patient unable to answer    Active Member of Clubs or Organizations: Patient unable to answer    Attends Banker Meetings: Patient unable to answer    Marital Status: Patient unable to answer  Intimate Partner Violence: Patient Unable To Answer (07/25/2024)   Epic    Fear of Current or Ex-Partner: Patient unable to answer    Emotionally Abused: Patient unable to answer    Physically Abused: Patient unable to answer    Sexually Abused: Patient unable to answer  Depression (PHQ2-9): Not on file  Alcohol Screen: Not on file  Housing: Patient Unable To Answer (07/25/2024)   Epic    Unable to Pay for Housing in the Last Year: Patient unable to answer    Number of Times Moved in the Last Year: Not on file    Homeless in the Last Year: Patient unable to answer  Utilities: Patient Unable To Answer (07/25/2024)   Epic    Threatened with loss of utilities: Patient unable to answer  Health Literacy: Not on  file    Past Surgical History:  Procedure Laterality Date   ABDOMINAL HYSTERECTOMY     CENTRAL LINE INSERTION  07/29/2024   Procedure: CENTRAL LINE INSERTION;  Surgeon: Jama Cordella [name], MD;  Location: [facility] INVASIVE CV LAB;  Service: Cardiovascular;;   COLONOSCOPY     COLONOSCOPY WITH PROPOFOL  N/A 08/22/2016   Procedure: COLONOSCOPY WITH PROPOFOL ;  Surgeon: Lamar ONEIDA Holmes, MD;  Location: [facility] ENDOSCOPY;  Service: Endoscopy;  Laterality: N/A;   COLONOSCOPY WITH PROPOFOL  N/A 08/11/2020  Procedure: COLONOSCOPY WITH PROPOFOL ;  Surgeon: Maryruth Ole DASEN, MD;  Location: [facility] ENDOSCOPY;  Service: Endoscopy;  Laterality: N/A;   COLONOSCOPY WITH PROPOFOL  N/A 11/12/2021   Procedure: COLONOSCOPY WITH PROPOFOL ;  Surgeon: Maryruth Ole DASEN, MD;  Location: [facility] ENDOSCOPY;  Service: Endoscopy;  Laterality: N/A;  DM   COLONOSCOPY WITH PROPOFOL  N/A 08/18/2023   Procedure: COLONOSCOPY WITH PROPOFOL ;  Surgeon: Maryruth Ole DASEN, MD;  Location: [facility] ENDOSCOPY;  Service: Endoscopy;  Laterality: N/A;   ENDOVASCULAR STENT GRAFT (AAA) N/A 07/25/2024   Procedure: ENDOVASCULAR STENT GRAFT (AAA);  Surgeon: Jama Cordella [name], MD;  Location: [facility] INVASIVE CV LAB;  Service: Cardiovascular;  Laterality: N/A;  ruptured AAA   ENDOVASCULAR STENT GRAFT REPAIR  07/25/2024   Procedure: ENDOVASCULAR STENT GRAFT REPAIR;  Surgeon: Jama Cordella [name], MD;  Location: [facility] INVASIVE CV LAB;  Service: Cardiovascular;;   ESOPHAGOGASTRODUODENOSCOPY (EGD) WITH PROPOFOL  N/A 08/11/2020   Procedure: ESOPHAGOGASTRODUODENOSCOPY (EGD) WITH PROPOFOL ;  Surgeon: Maryruth Ole DASEN, MD;  Location: [facility] ENDOSCOPY;  Service: Endoscopy;  Laterality: N/A;   EYE SURGERY     IVC FILTER INSERTION N/A 07/29/2024   Procedure: IVC FILTER INSERTION;  Surgeon: Jama Cordella [name], MD;  Location: [facility] INVASIVE CV LAB;  Service: Cardiovascular;  Laterality: N/A;   JOINT REPLACEMENT Left    left total knee replacement   LOWER EXTREMITY ANGIOGRAPHY  Right 02/20/2024   Procedure: Lower Extremity Angiography;  Surgeon: Jama Cordella [name], MD;  Location: [facility] INVASIVE CV LAB;  Service: Cardiovascular;  Laterality: Right;   ltkr     needle in foot removed     POLYPECTOMY  08/18/2023   Procedure: POLYPECTOMY;  Surgeon: Maryruth Ole DASEN, MD;  Location: [facility] ENDOSCOPY;  Service: Endoscopy;;   PULMONARY THROMBECTOMY Bilateral 07/29/2024   Procedure: PULMONARY THROMBECTOMY;  Surgeon: Jama Cordella [name], MD;  Location: [facility] INVASIVE CV LAB;  Service: Cardiovascular;  Laterality: Bilateral;   REFRACTIVE SURGERY      Family History  Problem Relation Age of Onset   Hypertension Mother    Hyperlipidemia Mother    Hypertension Father    Coronary artery disease Brother    Breast cancer Neg Hx     Allergies[1]     Latest Ref Rng & Units 08/04/2024    4:12 AM 08/03/2024    5:39 AM 08/02/2024    6:34 AM  CBC  WBC 4.0 - 10.5 K/uL 13.4  12.6  11.7   Hemoglobin 12.0 - 15.0 g/dL 89.8  9.7  9.3   Hematocrit 36.0 - 46.0 % 32.0  31.3  30.2   Platelets 150 - 400 K/uL 243  221  199       CMP     Component Value Date/Time   NA 143 08/04/2024 0412   NA 137 08/23/2013 0519   K 3.2 (L) 08/04/2024 0412   K 3.4 (L) 08/23/2013 0519   CL 102 08/04/2024 0412   CL 103 08/23/2013 0519   CO2 32 08/04/2024 0412   CO2 30 08/23/2013 0519   GLUCOSE 120 (H) 08/04/2024 0412   GLUCOSE 168 (H) 08/23/2013 0519   BUN 26 (H) 08/04/2024 0412   BUN 12 08/23/2013 0519   CREATININE 1.26 (H) 08/04/2024 0412   CREATININE 1.17 08/23/2013 0519   CALCIUM  8.9 08/04/2024 0412   CALCIUM  8.2 (L) 08/23/2013 0519   PROT 5.4 (L) 07/29/2024 2019   ALBUMIN  3.1 (L) 07/29/2024 2019   AST 99 (H) 07/29/2024 2019   ALT 27 07/29/2024 2019   ALKPHOS 60 07/29/2024 2019  BILITOT 0.6 07/29/2024 2019   GFRNONAA 43 (L) 08/04/2024 0412   GFRNONAA 48 (L) 08/23/2013 0519     No results found.     Assessment & Plan:   1. Ruptured infrarenal abdominal aortic aneurysm (AAA)  (HCC) (Primary) Ruptured infrarenal abdominal aortic aneurysm, post-endovascular repair Status post endovascular stent graft repair with mild sac enlargement, no endoleak, asymptomatic. - Reviewed ultrasound findings. - Discussed expected post-repair changes and prognosis. - Educated on symptoms of concern and advised emergency evaluation if she occurs. - Scheduled follow-up imaging in 3 months. - Outlined surveillance intervals: 3 months, 6 months, then annually if stable. - VAS US  EVAR DUPLEX; Future  2. Acute deep vein thrombosis (DVT) of right lower extremity, unspecified vein (HCC) Resolved acute deep vein thrombosis of right lower extremity Acute DVT resolved on ultrasound, continues apixaban . - Reviewed ultrasound confirming DVT resolution. - Confirmed ongoing apixaban  therapy. - Reviewed prescription refills and advised granddaughter to contact office for refills.  3. Peripheral arterial disease with history of revascularization Peripheral arterial disease with prior revascularization Peripheral arterial disease with prior revascularization. - Reviewed recent ultrasound of lower extremity vasculature - VAS US  ABI WITH/WO TBI; Future    Medications Ordered Prior to Encounter[2]  There are no Patient Instructions on file for this visit. Return in about 3 months (around 12/18/2024) for EVAR and ABIs.   Aarionna Germer E Wasif Simonich, NP      [1]  Allergies Allergen Reactions   Codeine   [2]  Current Outpatient Medications on File Prior to Visit  Medication Sig Dispense Refill   polyethylene glycol (MIRALAX ) 17 g packet Take 17 g by mouth 2 (two) times daily. 60 each 0   potassium chloride  (KLOR-CON ) 10 MEQ tablet Take 10 mEq by mouth daily.     amiodarone  (PACERONE ) 200 MG tablet Take 2 tablets (400 mg total) by mouth 2 (two) times daily for 2 days, THEN 1 tablet (200 mg total) daily.     amLODipine (NORVASC) 5 MG tablet Take 1 tablet by mouth daily.     apixaban  (ELIQUIS ) 5 MG TABS  tablet Take 2 tablets (10 mg total) by mouth 2 (two) times daily for 3 days, THEN 1 tablet (5 mg total) 2 (two) times daily.     apixaban  (ELIQUIS ) 5 MG TABS tablet Take 1 tablet (5 mg total) by mouth 2 (two) times daily. 60 tablet 6   atorvastatin (LIPITOR) 20 MG tablet Take 1 tablet by mouth daily.     dorzolamide -timolol  (COSOPT ) 2-0.5 % ophthalmic solution Place 1 drop into both eyes 2 (two) times daily.     metFORMIN (GLUCOPHAGE) 500 MG tablet Take 1 tablet by mouth 2 (two) times daily with a meal.     Multiple Vitamins-Minerals (SENTRY PO) Take by mouth daily.     pantoprazole  (PROTONIX ) 40 MG tablet Take 1 tablet (40 mg total) by mouth daily. 30 tablet 0   No current facility-administered medications on file prior to visit.   "

## 2024-12-16 ENCOUNTER — Ambulatory Visit (INDEPENDENT_AMBULATORY_CARE_PROVIDER_SITE_OTHER): Admitting: Nurse Practitioner

## 2024-12-16 ENCOUNTER — Encounter (INDEPENDENT_AMBULATORY_CARE_PROVIDER_SITE_OTHER)
